# Patient Record
Sex: Female | Born: 1970 | Race: White | Hispanic: No | Marital: Married | Smoking: Former smoker
Health system: Southern US, Community
[De-identification: ages and names within clinical notes are randomized; demographics above are authoritative.]

## PROBLEM LIST (undated history)

## (undated) DIAGNOSIS — M549 Dorsalgia, unspecified: Secondary | ICD-10-CM

## (undated) DIAGNOSIS — M5134 Other intervertebral disc degeneration, thoracic region: Secondary | ICD-10-CM

## (undated) DIAGNOSIS — K589 Irritable bowel syndrome without diarrhea: Secondary | ICD-10-CM

## (undated) DIAGNOSIS — D126 Benign neoplasm of colon, unspecified: Secondary | ICD-10-CM

## (undated) DIAGNOSIS — R51 Headache: Secondary | ICD-10-CM

## (undated) DIAGNOSIS — R112 Nausea with vomiting, unspecified: Secondary | ICD-10-CM

## (undated) DIAGNOSIS — G629 Polyneuropathy, unspecified: Secondary | ICD-10-CM

## (undated) DIAGNOSIS — R159 Full incontinence of feces: Secondary | ICD-10-CM

## (undated) DIAGNOSIS — K219 Gastro-esophageal reflux disease without esophagitis: Secondary | ICD-10-CM

## (undated) DIAGNOSIS — IMO0002 Reserved for concepts with insufficient information to code with codable children: Secondary | ICD-10-CM

## (undated) DIAGNOSIS — G8929 Other chronic pain: Secondary | ICD-10-CM

## (undated) DIAGNOSIS — F419 Anxiety disorder, unspecified: Secondary | ICD-10-CM

## (undated) DIAGNOSIS — J302 Other seasonal allergic rhinitis: Secondary | ICD-10-CM

## (undated) DIAGNOSIS — K598 Other specified functional intestinal disorders: Secondary | ICD-10-CM

## (undated) DIAGNOSIS — K3184 Gastroparesis: Secondary | ICD-10-CM

## (undated) DIAGNOSIS — Z9889 Other specified postprocedural states: Secondary | ICD-10-CM

## (undated) HISTORY — DX: Other specified functional intestinal disorders: K59.8

## (undated) HISTORY — DX: Other seasonal allergic rhinitis: J30.2

## (undated) HISTORY — PX: OTHER SURGICAL HISTORY: SHX169

## (undated) HISTORY — PX: CHOLECYSTECTOMY: SHX55

## (undated) HISTORY — PX: KNEE SURGERY: SHX244

## (undated) HISTORY — PX: ABLATION: SHX5711

## (undated) HISTORY — PX: ESOPHAGEAL DILATION: SHX303

## (undated) HISTORY — DX: Benign neoplasm of colon, unspecified: D12.6

## (undated) HISTORY — PX: OVARIAN CYST REMOVAL: SHX89

## (undated) HISTORY — DX: Reserved for concepts with insufficient information to code with codable children: IMO0002

## (undated) HISTORY — PX: COLON SURGERY: SHX602

## (undated) HISTORY — PX: APPENDECTOMY: SHX54

## (undated) HISTORY — PX: BREAST LUMPECTOMY: SHX2

## (undated) HISTORY — PX: COLONOSCOPY: SHX174

## (undated) HISTORY — PX: TUBAL LIGATION: SHX77

---

## 2010-04-12 ENCOUNTER — Ambulatory Visit: Payer: Self-pay | Admitting: Gastroenterology

## 2010-04-12 DIAGNOSIS — K589 Irritable bowel syndrome without diarrhea: Secondary | ICD-10-CM | POA: Insufficient documentation

## 2010-04-13 ENCOUNTER — Encounter: Payer: Self-pay | Admitting: Gastroenterology

## 2010-05-02 ENCOUNTER — Ambulatory Visit: Payer: Self-pay | Admitting: Gastroenterology

## 2010-05-02 ENCOUNTER — Ambulatory Visit (HOSPITAL_COMMUNITY)
Admission: RE | Admit: 2010-05-02 | Discharge: 2010-05-02 | Payer: Self-pay | Source: Home / Self Care | Admitting: Gastroenterology

## 2010-05-24 ENCOUNTER — Ambulatory Visit (HOSPITAL_COMMUNITY)
Admission: RE | Admit: 2010-05-24 | Discharge: 2010-05-24 | Payer: Self-pay | Source: Home / Self Care | Admitting: Gastroenterology

## 2010-05-24 DIAGNOSIS — D126 Benign neoplasm of colon, unspecified: Secondary | ICD-10-CM

## 2010-05-24 HISTORY — DX: Benign neoplasm of colon, unspecified: D12.6

## 2010-05-28 ENCOUNTER — Telehealth (INDEPENDENT_AMBULATORY_CARE_PROVIDER_SITE_OTHER): Payer: Self-pay

## 2010-06-07 ENCOUNTER — Encounter: Payer: Self-pay | Admitting: Urgent Care

## 2010-06-14 ENCOUNTER — Telehealth (INDEPENDENT_AMBULATORY_CARE_PROVIDER_SITE_OTHER): Payer: Self-pay

## 2010-07-24 NOTE — Letter (Signed)
Summary: TCS ORDER  TCS ORDER   Imported By: Ave Filter 05/02/2010 10:08:44  _____________________________________________________________________  External Attachment:    Type:   Image     Comment:   External Document

## 2010-07-24 NOTE — Assessment & Plan Note (Signed)
Summary: SCREENING, IBS-C   Visit Type:  Initial Consult Primary Care Joyia Riehle:  [facility]  Chief Complaint:  constipation, abd pain, and needs tcs.  History of Present Illness: FamHx: brother and sister, colon CA and died from it. No bleeding, black tarry stools,  Constipation: BMs-unless she has stool softener [employer]-Equate, 1st BM: solid then watery. Constipation all her life. No problems swallowing, fever, "cold chills". Nause if can't go to BM> May have burning sensation,. Does get better after BM.  Preventive Screening-Counseling & Management  Alcohol-Tobacco     Smoking Status: current  Current Medications (verified): 1)  Stool Softener With Laxative .Marland Kitchen.. 4 Once Daily 2)  Otc Allergy .... Once Daily 3)  Robitussin .... Once Daily 4)  Otc Niquil .... At Bedtime  Allergies (verified): 1)  ! Asa 2)  ! Pcn 3)  ! 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  Chest congestion.  Vital Signs:  Patient profile:   40 year old female Height:      65 inches Weight:      172 pounds BMI:     28.73 Temp:     98.3 degrees F oral Pulse rate:   88 / minute BP sitting:   138 / 90  (left arm) Cuff size:   regular  Vitals Entered By: Hendricks Limes LPN (April 12, 2010 1:03 PM)  Physical Exam  General:  Well developed, well nourished, no acute distress. Head:  Normocephalic and atraumatic. Eyes:  PERRL, no icterus. Mouth:  No deformity or lesions. Neck:  Supple; no masses. Lungs:  Clear throughout to auscultation. Heart:  Regular rate and rhythm; no  murmurs. Abdomen:  Soft, nontender and nondistended. Normal bowel sounds. Extremities:  No edema or deformities noted. Neurologic:  Alert and  oriented x4;  grossly normal neurologically.  Impression & Recommendations:  Problem # 1:  SCREENING, COLON CANCER (ICD-V76.51) 2 first degree relatives w/ colon CA  ~age 70. TCS NOV 9.  Problem # 2:  IRRITABLE BOWEL SYNDROME (ICD-564.1) Assessment: Unchanged FOR CONSTIPATION: TAKE A PROBIOTIC EVERY DAY. DA-C sample given, #28. DRINK 6 TO 8 CUPS OF WATER DAILY FOLLOW A HIGH FIBER DIET. SEE HO. You may continue to use [employer] Equate stool softener.  CC: PCP  Patient Instructions: 1)  FOR CONSTIPATION: 2)  TAKE A PROBIOTIC EVERY DAY. 3)  DRINK 6 TO 8 CUPS OF WATER DAILY 4)  FOLLOW A HIGH FIBER DIET. SEE HO. 5)  You may continue to use [employer] Equate stool softener. 6)  Colonoscopy on NOV 9. 7)  Follow up in 4 mos. 8)  The medication list was reviewed and reconciled.  All changed / newly prescribed medications were explained.  A complete medication list was provided to the patient / caregiver.  Appended Document: SCREENING, IBS-C reminder in computer  Appended Document: Orders Update    Clinical Lists Changes  Orders: Added new Service order of Consultation Level IV [phone]) - Signed

## 2010-07-24 NOTE — Progress Notes (Signed)
Summary: ? about amitiza  Phone Note Call from Patient Call back at [facility] Phone [phone]   Caller: Patient Summary of Call: pt came by office- left pt assistance paperwork for amitiza and requested samples. gave pt #4 boxes of Amitiza which is what slf put in op note. pt stated she was taking amitiza in a blue box and her rx was written for . Informed pt I would have to clarify with slf before I could give her the since was in op note. Which should pt be taking?  Pt also stated she has not had a bm since doing her prep on Thursday and had some L side abd pain on Sat and Sun. She has been taking the [place]. Wants to know if there is anything else she needs to do? please advise Initial call taken by: Hendricks Limes LPN,  May 28, 2010 4:09 PM     Appended Document: ? about amitiza Pt may take Amitiza 24 micrograms two times a day. Mild abd pain is expected after TCS in pts who have IBS. Continue high fiber diet and drink 8 cups of water daily.  Appended Document: ? about amitiza tried to call pt- LMOM  Appended Document: ? about amitiza pt aware

## 2010-07-24 NOTE — Letter (Signed)
Summary: TCS ORDER  TCS ORDER   Imported By: Rexene Alberts 04/13/2010 10:02:14  _____________________________________________________________________  External Attachment:    Type:   Image     Comment:   External Document  Appended Document: TCS ORDER Pt needs TCS w/i next momth w/ propofol. Unable to to be adequately sedated with Demerol and Versed. NEEDS OVERTUBE-HALFLYTELY. BEGIN CLEAR LIQUIDS WITH LUNCH 2 DAYS PRIOR TO PROCEDURE.  Appended Document: TCS ORDER Pt rescheduled to 05/24/10 in the OR..Instructions placed in the mail.

## 2010-07-26 NOTE — Progress Notes (Signed)
Summary: ?'s about Amitiza Assistance/ samples  Phone Note Call from Patient   Caller: Patient Summary of Call: Pt called and said she has not heard anything from the Program/Assistance that was supposed to be sending her  Amitiza. She is out. I told her i would check with Raynelle Fanning and see when she should be getting the med in mail. Leaving  samples of Amitiza 24 micrograms  # 16 at front for pt. She takes two times a day. Initial call taken by: Cloria Spring LPN,  June 14, 2010 10:15 AM     Appended Document: ?'s about Amitiza Assistance/ samples spoke with pt assistance- pts order shipped 06/12/10 and could take up to 5 business days to get here  Appended Document: ?'s about Amitiza Assistance/ samples pt aware

## 2010-09-04 LAB — HCG, QUANTITATIVE, PREGNANCY: hCG, Beta Chain, Quant, S: <2 m[IU]/mL (ref <5)

## 2010-09-04 LAB — BASIC METABOLIC PANEL
Calcium: 9.6 mg/dL (ref 8.4–10.5)
GFR calc Af Amer: >60 mL/min (ref >60)
GFR calc non Af Amer: >60 mL/min (ref >60)
Glucose, Bld: 94 mg/dL (ref 70–99)
Potassium: 4.2 mEq/L (ref 3.5–5.1)
Sodium: 137 mEq/L (ref 135–145)

## 2010-09-04 LAB — HEMOGLOBIN AND HEMATOCRIT, BLOOD: HCT: 40 % (ref 36.0–46.0)

## 2010-09-20 NOTE — Medication Information (Signed)
Summary: pt assistance paperwork  pt assistance paperwork   Imported By: Hendricks Limes LPN 82/95/6213 08:65:78  _____________________________________________________________________  External Attachment:    Type:   Image     Comment:   External Document

## 2010-12-24 ENCOUNTER — Other Ambulatory Visit: Payer: Self-pay

## 2010-12-24 MED ORDER — LUBIPROSTONE 24 MCG PO CAPS: 24.0000 ug | ORAL_CAPSULE | Freq: Two times a day (BID) | ORAL | Status: AC

## 2010-12-24 NOTE — Telephone Encounter (Signed)
RX done. Patient needs OV for continued refills.

## 2010-12-24 NOTE — Telephone Encounter (Signed)
rx faxed to Patient assistance. Please schedule appt.

## 2010-12-24 NOTE — Telephone Encounter (Addendum)
Pt needs written rx for patient assistance program for Amitiza . Fax number is [phone]

## 2010-12-25 ENCOUNTER — Encounter: Payer: Self-pay | Admitting: Internal Medicine

## 2010-12-25 NOTE — Telephone Encounter (Signed)
Pt is aware of OV for 7/26 @ 2pm with [facility]

## 2011-01-17 ENCOUNTER — Ambulatory Visit (INDEPENDENT_AMBULATORY_CARE_PROVIDER_SITE_OTHER): Payer: Medicaid Other | Admitting: Gastroenterology

## 2011-01-17 ENCOUNTER — Encounter: Payer: Self-pay | Admitting: Gastroenterology

## 2011-01-17 DIAGNOSIS — K589 Irritable bowel syndrome without diarrhea: Secondary | ICD-10-CM

## 2011-01-17 DIAGNOSIS — D126 Benign neoplasm of colon, unspecified: Secondary | ICD-10-CM

## 2011-01-17 NOTE — Assessment & Plan Note (Addendum)
Removed DEC 2011.  TCS DEC 2016 WITH OVERTUBE AND PROPOFOL.

## 2011-01-17 NOTE — Progress Notes (Signed)
  Subjective:    Patient ID: Alicia Tyler, female    DOB: [birth year], 40 y.o.   MRN: [id number]   HPI Amitiza helps. Went from "never" and may have a BM q3days. Hurts and has nausea if she doesn't have a BM. hans't tried MLX with teh Amitiza. Uses [employer] stool softener/stimulant and it helps. Also Fiber gummies helps as well. Questions about next TCS. Last BM yesterday. INTENTIONALLY LOST 23 LBS.  Past Medical History  Diagnosis Date  . Seasonal allergies   . BMI (body mass index) 20.0-29.01 Apr 2010 172 LBS   Past Surgical History  Procedure Date  . Tubal ligation   . Ovarian cyst removal   . Breast lumpectomy     from the left breast  . Colonoscopy NOV 2011 SCREENING     HYPERPLASTIC RECTAL POLYP, SML IH, incomplete due to discomfort  . Colonoscopy DEC 2011 w/ PROPOFOL    SERRATED ADENOMA, HYPERPLASTIC POLY[S[   Allergies  Allergen Reactions  . Aspirin   . Codeine   . Penicillins    Current Outpatient Prescriptions  Medication Sig Dispense Refill  . Biotin 5000 MCG CAPS Take 5,000 mcg by mouth 2 (two) times daily.        . Cholecalciferol (VITAMIN D) 2000 UNITS tablet Take 2,000 Units by mouth daily.        Marland Kitchen gabapentin (NEURONTIN) 100 MG tablet Take 100 mg by mouth 3 (three) times daily.        Marland Kitchen loratadine (CLARITIN) 10 MG tablet Take 10 mg by mouth daily.        Marland Kitchen lubiprostone (AMITIZA) 24 MCG capsule Take 1 capsule (24 mcg total) by mouth 2 (two) times daily with a meal.  60 capsule  5  . rizatriptan (MAXALT-MLT) 10 MG disintegrating tablet Take 10 mg by mouth as needed. May repeat in 2 hours if needed       . topiramate (TOPAMAX) 50 MG tablet Take 50 mg by mouth 2 (two) times daily.         Family History  Problem Relation Age of Onset  . Breast cancer Maternal Aunt   . Colon cancer Neg Hx   . Colon polyps Neg Hx       Review of Systems     Objective:   Physical Exam  Vitals reviewed. Constitutional: She is oriented to person, place, and time. She appears  well-developed and well-nourished. No distress.  Cardiovascular: Normal rate, regular rhythm and normal heart sounds.   Pulmonary/Chest: Effort normal and breath sounds normal.  Abdominal: Soft. Bowel sounds are normal. She exhibits no distension. There is tenderness (MILD RLQ TTP when LLQ palpated).  Neurological: She is alert and oriented to person, place, and time.          Assessment & Plan:

## 2011-01-17 NOTE — Progress Notes (Signed)
Cc to PCP 

## 2011-01-17 NOTE — Assessment & Plan Note (Signed)
Sx improved.  Continue Amitiza. Use [employer] laxative 2-4 times a week. Use Gummies when she can afford them. OPV in 6 mos.

## 2011-01-17 NOTE — Progress Notes (Signed)
Reminder in epic to follow up in 6 months °

## 2011-05-07 ENCOUNTER — Other Ambulatory Visit: Payer: Self-pay | Admitting: Neurosurgery

## 2011-05-08 ENCOUNTER — Encounter (HOSPITAL_COMMUNITY): Payer: Self-pay | Admitting: Pharmacy Technician

## 2011-05-09 ENCOUNTER — Encounter (HOSPITAL_COMMUNITY)
Admission: RE | Admit: 2011-05-09 | Discharge: 2011-05-09 | Disposition: A | Discharge status: Home / Self Care | Payer: Medicaid Other | Source: Ambulatory Visit | Attending: Neurosurgery | Admitting: Neurosurgery

## 2011-05-09 ENCOUNTER — Encounter (HOSPITAL_COMMUNITY): Payer: Self-pay

## 2011-05-09 HISTORY — DX: Headache: R51

## 2011-05-09 HISTORY — DX: Anxiety disorder, unspecified: F41.9

## 2011-05-09 LAB — BASIC METABOLIC PANEL
CO2: 23 mEq/L (ref 19–32)
Calcium: 10.2 mg/dL (ref 8.4–10.5)
Creatinine, Ser: 0.91 mg/dL (ref 0.50–1.10)
GFR calc non Af Amer: 78 mL/min — ABNORMAL LOW (ref >90)
Glucose, Bld: 97 mg/dL (ref 70–99)

## 2011-05-09 LAB — CBC
MCH: 30.9 pg (ref 26.0–34.0)
MCHC: 34.3 g/dL (ref 30.0–36.0)
MCV: 90.1 fL (ref 78.0–100.0)
Platelets: 289 10*3/uL (ref 150–400)
RBC: 4.53 MIL/uL (ref 3.87–5.11)

## 2011-05-09 MED ORDER — VANCOMYCIN HCL IN DEXTROSE 1-5 GM/200ML-% IV SOLN: 1000.0000 mg | INTRAVENOUS | Status: DC

## 2011-05-09 NOTE — Pre-Procedure Instructions (Signed)
20 Alicia Tyler  05/09/2011   Your procedure is scheduled on:  05/14/11  Report to [facility] at 730 AM.  Call this number if you have problems the morning of surgery: [phone]   Remember:   Do not eat food:After Midnight.  Do not drink clear liquids: 4 Hours before arrival.  Take these medicines the morning of surgery with A SIP OF WATER: xanax,norco,topamax,neurontin   Do not wear jewelry, make-up or nail polish.  Do not wear lotions, powders, or perfumes. You may wear deodorant.  Do not shave 48 hours prior to surgery.  Do not bring valuables to the hospital.  Contacts, dentures or bridgework may not be worn into surgery.  Leave suitcase in the car. After surgery it may be brought to your room.  For patients admitted to the hospital, checkout time is 11:00 AM the day of discharge.   Patients discharged the day of surgery will not be allowed to drive home.  Name and phone number of your driver: family  Special Instructions: CHG Shower Use Special Wash: 1/2 bottle night before surgery and 1/2 bottle morning of surgery.   Please read over the following fact sheets that you were given: MRSA Information and Surgical Site Infection Prevention

## 2011-05-14 ENCOUNTER — Encounter (HOSPITAL_COMMUNITY): Payer: Self-pay | Admitting: Anesthesiology

## 2011-05-14 ENCOUNTER — Inpatient Hospital Stay (HOSPITAL_COMMUNITY)
Admission: RE | Admit: 2011-05-14 | Discharge: 2011-05-18 | DRG: 460 | Disposition: A | Discharge status: Home / Self Care | Payer: Medicaid Other | Source: Ambulatory Visit | Attending: Neurosurgery | Admitting: Neurosurgery

## 2011-05-14 ENCOUNTER — Inpatient Hospital Stay (HOSPITAL_COMMUNITY): Payer: Medicaid Other

## 2011-05-14 ENCOUNTER — Inpatient Hospital Stay (HOSPITAL_COMMUNITY): Payer: Medicaid Other | Admitting: Anesthesiology

## 2011-05-14 ENCOUNTER — Encounter (HOSPITAL_COMMUNITY): Payer: Self-pay | Admitting: *Deleted

## 2011-05-14 ENCOUNTER — Encounter (HOSPITAL_COMMUNITY)
Admission: RE | Disposition: A | Discharge status: Home / Self Care | Payer: Self-pay | Source: Ambulatory Visit | Attending: Neurosurgery

## 2011-05-14 DIAGNOSIS — K589 Irritable bowel syndrome without diarrhea: Secondary | ICD-10-CM

## 2011-05-14 DIAGNOSIS — M5137 Other intervertebral disc degeneration, lumbosacral region: Principal | ICD-10-CM | POA: Diagnosis present

## 2011-05-14 DIAGNOSIS — M4310 Spondylolisthesis, site unspecified: Secondary | ICD-10-CM | POA: Diagnosis present

## 2011-05-14 DIAGNOSIS — M51379 Other intervertebral disc degeneration, lumbosacral region without mention of lumbar back pain or lower extremity pain: Principal | ICD-10-CM | POA: Diagnosis present

## 2011-05-14 DIAGNOSIS — Z01812 Encounter for preprocedural laboratory examination: Secondary | ICD-10-CM

## 2011-05-14 HISTORY — PX: BACK SURGERY: SHX140

## 2011-05-14 HISTORY — DX: Nausea with vomiting, unspecified: R11.2

## 2011-05-14 HISTORY — DX: Other specified postprocedural states: Z98.890

## 2011-05-14 LAB — TYPE AND SCREEN
ABO/RH(D): A POS
Antibody Screen: NEGATIVE

## 2011-05-14 SURGERY — POSTERIOR LUMBAR FUSION 1 LEVEL
Anesthesia: General | Site: Back

## 2011-05-14 MED ORDER — BUPIVACAINE-EPINEPHRINE PF 0.5-1:200000 % IJ SOLN: INTRAMUSCULAR | Status: DC | PRN

## 2011-05-14 MED ORDER — PROMETHAZINE HCL 25 MG/ML IJ SOLN: 6.2500 mg | INTRAMUSCULAR | Status: DC | PRN

## 2011-05-14 MED ORDER — ROCURONIUM BROMIDE 100 MG/10ML IV SOLN
INTRAVENOUS | Status: DC | PRN
  Administered 2011-05-14: 50 mg via INTRAVENOUS
  Administered 2011-05-14: 10 mg via INTRAVENOUS

## 2011-05-14 MED ORDER — ACETAMINOPHEN 650 MG RE SUPP: 650.0000 mg | RECTAL | Status: DC | PRN

## 2011-05-14 MED ORDER — DOCUSATE SODIUM 100 MG PO CAPS
100.0000 mg | ORAL_CAPSULE | Freq: Two times a day (BID) | ORAL | Status: DC
Start: 2011-05-14 — End: 2011-05-18
  Administered 2011-05-14 – 2011-05-18 (×8): 100 mg via ORAL
  Filled 2011-05-14 (×7): qty 1

## 2011-05-14 MED ORDER — VANCOMYCIN HCL IN DEXTROSE 1-5 GM/200ML-% IV SOLN
1000.0000 mg | Freq: Once | INTRAVENOUS | Status: AC
  Administered 2011-05-14: 1000 mg via INTRAVENOUS
  Filled 2011-05-14: qty 200

## 2011-05-14 MED ORDER — LACTATED RINGERS IV SOLN
1000.0000 mL | INTRAVENOUS | Status: DC
  Administered 2011-05-14 (×2): via INTRAVENOUS

## 2011-05-14 MED ORDER — HYDROMORPHONE HCL PF 1 MG/ML IJ SOLN
0.2500 mg | INTRAMUSCULAR | Status: DC | PRN
  Administered 2011-05-14: 0.25 mg via INTRAVENOUS

## 2011-05-14 MED ORDER — OXYCODONE-ACETAMINOPHEN 5-325 MG PO TABS
1.0000 | ORAL_TABLET | ORAL | Status: DC | PRN
  Administered 2011-05-17 – 2011-05-18 (×6): 2 via ORAL
  Filled 2011-05-14 (×6): qty 2

## 2011-05-14 MED ORDER — MORPHINE SULFATE 2 MG/ML IJ SOLN: 2.0000 mg | INTRAMUSCULAR | Status: DC | PRN

## 2011-05-14 MED ORDER — MIDAZOLAM HCL 2 MG/2ML IJ SOLN: 0.5000 mg | Freq: Once | INTRAMUSCULAR | Status: DC | PRN

## 2011-05-14 MED ORDER — MEPERIDINE HCL 25 MG/ML IJ SOLN: 6.2500 mg | INTRAMUSCULAR | Status: DC | PRN

## 2011-05-14 MED ORDER — ONDANSETRON HCL 4 MG/2ML IJ SOLN
INTRAMUSCULAR | Status: DC | PRN
  Administered 2011-05-14: 4 mg via INTRAVENOUS

## 2011-05-14 MED ORDER — SODIUM CHLORIDE 0.9 % IR SOLN
Status: DC | PRN
  Administered 2011-05-14: 1000 mL

## 2011-05-14 MED ORDER — DIPHENHYDRAMINE HCL 12.5 MG/5ML PO ELIX: 12.5000 mg | ORAL_SOLUTION | Freq: Four times a day (QID) | ORAL | Status: DC | PRN

## 2011-05-14 MED ORDER — SODIUM CHLORIDE 0.9 % IJ SOLN: 3.0000 mL | Freq: Two times a day (BID) | INTRAMUSCULAR | Status: DC

## 2011-05-14 MED ORDER — DEXAMETHASONE SODIUM PHOSPHATE 4 MG/ML IJ SOLN
INTRAMUSCULAR | Status: DC | PRN
  Administered 2011-05-14: 4 mg via INTRAVENOUS

## 2011-05-14 MED ORDER — DIPHENHYDRAMINE HCL 50 MG/ML IJ SOLN: 12.5000 mg | Freq: Four times a day (QID) | INTRAMUSCULAR | Status: DC | PRN

## 2011-05-14 MED ORDER — MORPHINE SULFATE 2 MG/ML IJ SOLN: 0.0500 mg/kg | INTRAMUSCULAR | Status: DC | PRN

## 2011-05-14 MED ORDER — HYDROMORPHONE HCL PF 1 MG/ML IJ SOLN: 0.2500 mg | INTRAMUSCULAR | Status: DC | PRN

## 2011-05-14 MED ORDER — THROMBIN 20000 UNITS EX KIT
PACK | CUTANEOUS | Status: DC | PRN
  Administered 2011-05-14: 10:00:00 via TOPICAL

## 2011-05-14 MED ORDER — GLYCOPYRROLATE 0.2 MG/ML IJ SOLN
INTRAMUSCULAR | Status: DC | PRN
  Administered 2011-05-14: .6 mg via INTRAVENOUS

## 2011-05-14 MED ORDER — NALOXONE HCL 0.4 MG/ML IJ SOLN: 0.4000 mg | INTRAMUSCULAR | Status: DC | PRN

## 2011-05-14 MED ORDER — SODIUM CHLORIDE 0.9 % IV SOLN: 250.0000 mL | INTRAVENOUS | Status: DC

## 2011-05-14 MED ORDER — MORPHINE SULFATE (PF) 1 MG/ML IV SOLN
INTRAVENOUS | Status: DC
  Administered 2011-05-14: 21.74 mg via INTRAVENOUS
  Administered 2011-05-14: 17:00:00 via INTRAVENOUS
  Administered 2011-05-14: 25 mg via INTRAVENOUS
  Administered 2011-05-14: 14:00:00 via INTRAVENOUS
  Administered 2011-05-15: 10.34 mg via INTRAVENOUS
  Administered 2011-05-15: 15 mg via INTRAVENOUS
  Administered 2011-05-15: 18.41 mg via INTRAVENOUS
  Administered 2011-05-15 (×2): 25 mg via INTRAVENOUS
  Administered 2011-05-15: 18 mg via INTRAVENOUS
  Administered 2011-05-16: 25 mg via INTRAVENOUS
  Administered 2011-05-16 (×2): 7.5 mg via INTRAVENOUS
  Administered 2011-05-16 (×2): 1.5 mg via INTRAVENOUS
  Administered 2011-05-16: 10.5 mg via INTRAVENOUS
  Administered 2011-05-17: 1.5 mg via INTRAVENOUS
  Filled 2011-05-14 (×9): qty 25

## 2011-05-14 MED ORDER — MENTHOL 3 MG MT LOZG: 1.0000 | LOZENGE | OROMUCOSAL | Status: DC | PRN

## 2011-05-14 MED ORDER — ONDANSETRON HCL 4 MG/2ML IJ SOLN
4.0000 mg | Freq: Four times a day (QID) | INTRAMUSCULAR | Status: DC | PRN
  Administered 2011-05-15: 4 mg via INTRAVENOUS
  Filled 2011-05-14 (×2): qty 2

## 2011-05-14 MED ORDER — FENTANYL CITRATE 0.05 MG/ML IJ SOLN
INTRAMUSCULAR | Status: DC | PRN
  Administered 2011-05-14: 50 ug via INTRAVENOUS
  Administered 2011-05-14: 150 ug via INTRAVENOUS
  Administered 2011-05-14: 50 ug via INTRAVENOUS
  Administered 2011-05-14: 100 ug via INTRAVENOUS
  Administered 2011-05-14 (×2): 50 ug via INTRAVENOUS

## 2011-05-14 MED ORDER — NEOSTIGMINE METHYLSULFATE 1 MG/ML IJ SOLN
INTRAMUSCULAR | Status: DC | PRN
  Administered 2011-05-14: 4 mg via INTRAVENOUS

## 2011-05-14 MED ORDER — PROPOFOL 10 MG/ML IV EMUL
INTRAVENOUS | Status: DC | PRN
  Administered 2011-05-14: 150 mg via INTRAVENOUS
  Administered 2011-05-14: 100 mg via INTRAVENOUS

## 2011-05-14 MED ORDER — ONDANSETRON HCL 4 MG/2ML IJ SOLN: 4.0000 mg | Freq: Four times a day (QID) | INTRAMUSCULAR | Status: DC | PRN

## 2011-05-14 MED ORDER — ACETAMINOPHEN 325 MG PO TABS
650.0000 mg | ORAL_TABLET | ORAL | Status: DC | PRN
  Administered 2011-05-17 – 2011-05-18 (×2): 650 mg via ORAL
  Filled 2011-05-14 (×2): qty 2

## 2011-05-14 MED ORDER — SODIUM CHLORIDE 0.9 % IJ SOLN: 9.0000 mL | INTRAMUSCULAR | Status: DC | PRN

## 2011-05-14 MED ORDER — MIDAZOLAM HCL 5 MG/5ML IJ SOLN
INTRAMUSCULAR | Status: DC | PRN
  Administered 2011-05-14: 2 mg via INTRAVENOUS

## 2011-05-14 MED ORDER — BUPIVACAINE LIPOSOME 1.3 % IJ SUSP
20.0000 mL | Freq: Once | INTRAMUSCULAR | Status: AC
  Administered 2011-05-14: 20 mL
  Filled 2011-05-14: qty 20

## 2011-05-14 MED ORDER — ZOLPIDEM TARTRATE 10 MG PO TABS: 10.0000 mg | ORAL_TABLET | Freq: Every evening | ORAL | Status: DC | PRN

## 2011-05-14 MED ORDER — POTASSIUM CHLORIDE IN NACL 20-0.9 MEQ/L-% IV SOLN
INTRAVENOUS | Status: DC
  Administered 2011-05-14 – 2011-05-17 (×2): via INTRAVENOUS
  Filled 2011-05-14 (×12): qty 1000

## 2011-05-14 MED ORDER — VANCOMYCIN HCL IN DEXTROSE 1-5 GM/200ML-% IV SOLN
1000.0000 mg | Freq: Two times a day (BID) | INTRAVENOUS | Status: DC
  Filled 2011-05-14: qty 200

## 2011-05-14 SURGICAL SUPPLY — 67 items
BENZOIN TINCTURE PRP APPL 2/3 (GAUZE/BANDAGES/DRESSINGS) ×2 IMPLANT
BLADE SURG ROTATE 9660 (MISCELLANEOUS) IMPLANT
BUR ACORN 6.0 (BURR) ×2 IMPLANT
BUR MATCHSTICK NEURO 3.0 LAGG (BURR) ×2 IMPLANT
CANISTER SUCTION 2500CC (MISCELLANEOUS) ×2 IMPLANT
CLOTH BEACON ORANGE TIMEOUT ST (SAFETY) ×2 IMPLANT
CONT SPEC 4OZ CLIKSEAL STRL BL (MISCELLANEOUS) ×4 IMPLANT
COVER BACK TABLE 24X17X13 BIG (DRAPES) IMPLANT
COVER TABLE BACK 60X90 (DRAPES) ×2 IMPLANT
DRAPE C-ARM 42X72 X-RAY (DRAPES) ×4 IMPLANT
DRAPE LAPAROTOMY 100X72X124 (DRAPES) ×2 IMPLANT
DRAPE POUCH INSTRU U-SHP 10X18 (DRAPES) ×2 IMPLANT
DRSG PAD ABDOMINAL 8X10 ST (GAUZE/BANDAGES/DRESSINGS) ×2 IMPLANT
DURAPREP 26ML APPLICATOR (WOUND CARE) ×2 IMPLANT
ELECT BLADE 4.0 EZ CLEAN MEGAD (MISCELLANEOUS) ×2
ELECT REM PT RETURN 9FT ADLT (ELECTROSURGICAL) ×2
ELECTRODE BLDE 4.0 EZ CLN MEGD (MISCELLANEOUS) ×1 IMPLANT
ELECTRODE REM PT RTRN 9FT ADLT (ELECTROSURGICAL) ×1 IMPLANT
EVACUATOR 1/8 PVC DRAIN (DRAIN) IMPLANT
GAUZE SPONGE 4X4 16PLY XRAY LF (GAUZE/BANDAGES/DRESSINGS) ×2 IMPLANT
GLOVE BIOGEL M 8.0 STRL (GLOVE) ×4 IMPLANT
GLOVE BIOGEL PI IND STRL 7.0 (GLOVE) ×1 IMPLANT
GLOVE BIOGEL PI IND STRL 8.5 (GLOVE) ×1 IMPLANT
GLOVE BIOGEL PI INDICATOR 7.0 (GLOVE) ×1
GLOVE BIOGEL PI INDICATOR 8.5 (GLOVE) ×1
GLOVE ECLIPSE 7.5 STRL STRAW (GLOVE) ×6 IMPLANT
GLOVE ECLIPSE 8.5 STRL (GLOVE) ×2 IMPLANT
GLOVE EXAM NITRILE LRG STRL (GLOVE) IMPLANT
GLOVE EXAM NITRILE MD LF STRL (GLOVE) ×2 IMPLANT
GLOVE EXAM NITRILE XL STR (GLOVE) IMPLANT
GLOVE EXAM NITRILE XS STR PU (GLOVE) IMPLANT
GLOVE INDICATOR 8.0 STRL GRN (GLOVE) ×2 IMPLANT
GLOVE SURG SS PI 6.5 STRL IVOR (GLOVE) ×4 IMPLANT
GOWN BRE IMP SLV AUR LG STRL (GOWN DISPOSABLE) ×2 IMPLANT
GOWN BRE IMP SLV AUR XL STRL (GOWN DISPOSABLE) ×4 IMPLANT
GOWN STRL REIN 2XL LVL4 (GOWN DISPOSABLE) ×4 IMPLANT
KIT BASIN OR (CUSTOM PROCEDURE TRAY) ×2 IMPLANT
KIT ROOM TURNOVER OR (KITS) ×2 IMPLANT
NEEDLE HYPO 18GX1.5 BLUNT FILL (NEEDLE) IMPLANT
NEEDLE HYPO 21X1.5 SAFETY (NEEDLE) ×2 IMPLANT
NEEDLE HYPO 25X1 1.5 SAFETY (NEEDLE) IMPLANT
NS IRRIG 1000ML POUR BTL (IV SOLUTION) ×2 IMPLANT
PACK LAMINECTOMY NEURO (CUSTOM PROCEDURE TRAY) ×2 IMPLANT
PAD ARMBOARD 7.5X6 YLW CONV (MISCELLANEOUS) ×6 IMPLANT
PATTIES SURGICAL .5 X1 (DISPOSABLE) ×2 IMPLANT
PATTIES SURGICAL .5 X3 (DISPOSABLE) IMPLANT
ROD REVERE 6.35 40MM (Rod) ×4 IMPLANT
SCREW REVERE 5.5X45 (Screw) ×8 IMPLANT
SPACER SUSTAIN O SML 8X22 12MM (Spacer) ×4 IMPLANT
SPONGE GAUZE 4X4 12PLY (GAUZE/BANDAGES/DRESSINGS) ×2 IMPLANT
SPONGE LAP 4X18 X RAY DECT (DISPOSABLE) IMPLANT
SPONGE NEURO XRAY DETECT 1X3 (DISPOSABLE) IMPLANT
SPONGE SURGIFOAM ABS GEL 100 (HEMOSTASIS) ×2 IMPLANT
STRIP CLOSURE SKIN 1/2X4 (GAUZE/BANDAGES/DRESSINGS) ×2 IMPLANT
STRIP VITOSS 25X100X4MM (Neuro Prosthesis/Implant) ×2 IMPLANT
SUT VIC AB 1 CT1 18XBRD ANBCTR (SUTURE) ×1 IMPLANT
SUT VIC AB 1 CT1 8-18 (SUTURE) ×1
SUT VIC AB 2-0 CP2 18 (SUTURE) ×2 IMPLANT
SUT VIC AB 3-0 SH 8-18 (SUTURE) ×2 IMPLANT
SYR 20CC LL (SYRINGE) ×2 IMPLANT
SYR 20ML ECCENTRIC (SYRINGE) ×2 IMPLANT
SYR 5ML LL (SYRINGE) IMPLANT
TAPE CLOTH SURG 4X10 WHT LF (GAUZE/BANDAGES/DRESSINGS) ×2 IMPLANT
TOWEL OR 17X24 6PK STRL BLUE (TOWEL DISPOSABLE) ×2 IMPLANT
TOWEL OR 17X26 10 PK STRL BLUE (TOWEL DISPOSABLE) ×2 IMPLANT
TRAY FOLEY CATH 14FRSI W/METER (CATHETERS) ×2 IMPLANT
WATER STERILE IRR 1000ML POUR (IV SOLUTION) ×2 IMPLANT

## 2011-05-14 NOTE — Plan of Care (Signed)
Problem: Phase I Progression Outcomes Goal: Pain controlled with appropriate interventions Outcome: Progressing Pt. Continues to rate pain 7 out of 10 with PCA usage. She, however, states that 6 is an pain level in which she is comfortable. Goal: Initial discharge plan identified Outcome: Completed/Met Date Met:  05/14/11 Patient to return back to home with husband Goal: PT/OT consults requested Outcome: Completed/Met Date Met:  05/14/11 PT/OT consults
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
## 2011-05-14 NOTE — Anesthesia Preprocedure Evaluation (Addendum)
Anesthesia Evaluation  Patient identified by MRN, date of birth, ID band Patient awake    Reviewed: Allergy & Precautions, H&P , NPO status , Patient's Chart, lab work & pertinent test results  Airway Mallampati: II      Dental  (+) Dental Advisory Given   Pulmonary          Cardiovascular     Neuro/Psych  Headaches,    GI/Hepatic   Endo/Other    Renal/GU      Musculoskeletal   Abdominal   Peds  Hematology   Anesthesia Other Findings   Reproductive/Obstetrics                           Anesthesia Physical Anesthesia Plan  ASA: II  Anesthesia Plan: General   Post-op Pain Management:    Induction: Intravenous  Airway Management Planned: Oral ETT  Additional Equipment:   Intra-op Plan:   Post-operative Plan: Extubation in OR  Informed Consent: I have reviewed the patients History and Physical, chart, labs and discussed the procedure including the risks, benefits and alternatives for the proposed anesthesia with the patient or authorized representative who has indicated his/her understanding and acceptance.   Dental advisory given  Plan Discussed with: CRNA and Anesthesiologist  Anesthesia Plan Comments:        Anesthesia Quick Evaluation

## 2011-05-14 NOTE — Anesthesia Procedure Notes (Addendum)
Procedure Name: Intubation Date/Time: 05/14/2011 9:55 AM Performed by: Elizbeth Squires Pre-anesthesia Checklist: Patient identified, Emergency Drugs available, Suction available, Patient being monitored and Timeout performed Patient Re-evaluated:Patient Re-evaluated prior to inductionOxygen Delivery Method: Circle System Utilized Preoxygenation: Pre-oxygenation with 100% oxygen Intubation Type: IV induction Ventilation: Mask ventilation without difficulty Laryngoscope Size: Mac and 3 Grade View: Grade I Tube type: Oral Tube size: 7.0 mm Number of attempts: 1 Airway Equipment and Method: stylet Placement Confirmation: ETT inserted through vocal cords under direct vision,  positive ETCO2 and breath sounds checked- equal and bilateral Secured at: 21 cm Tube secured with: Tape Dental Injury: Teeth and Oropharynx as per pre-operative assessment
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
## 2011-05-14 NOTE — Transfer of Care (Signed)
Immediate Anesthesia Transfer of Care Note  Patient: Biomedical engineer  Procedure(s) Performed:  POSTERIOR LUMBAR FUSION 1 LEVEL - Lumbar five sacral one Diskectomy, cages, pedicle screws, posterolateral arthrodesis, cellsaver  Patient Location: PACU  Anesthesia Type: General  Level of Consciousness: awake  Airway & Oxygen Therapy: Patient Spontanous Breathing and Patient connected to nasal cannula oxygen  Post-op Assessment: Report given to PACU RN, Post -op Vital signs reviewed and stable and Patient moving all extremities  Post vital signs: Reviewed and stable  Complications: No apparent anesthesia complications

## 2011-05-14 NOTE — Op Note (Signed)
Brief history:lower back pain with radiation to lower extremities.xrays showed l5s1 grade 1 spondylolisthesis  Preoperative diagnosis:l5s1  spondylolisthesisDegenerative disc disease, spinal stenosis; lumbago; lumbar radiculopathy  Postoperative diagnosis:l5s1 spondylolisthesis Degenerative disc disease, spinal stenosis; lumbago; lumbar radiculopathy  Procedure:l5 Laminectomies/foraminotomies to decompress the bilaterall5s1nerve roots(the work required to do this was in addition to the work required to do the posterior lumbar interbody fusion because of the patient's spinal stenosis, facet arthropathy,. requiring a wide decompression of the nerve roots.);l5s1 posterior lumbar interbody fusion with local morselized autograft bone and vitosst extender; insertion of interbody prosthesis atl5s1l5s1(globus peek interbody prosthesis); posterior instrumentation from l5tos1with globus titanium pedicle screws and rods; posterior lateral arthrodesis atl5s1with local morselized autograft bone and Vitoss bone graft extender.c-arm and cell saver.  Surgeon: Dr. Hilda Tyler  Asst.:H. Danielle Dess  Anesthesia: Gen. endotracheal  Estimated blood loss:200 cc  Drains: None  Locations: None  Description of procedure: The patient was brought to the operating room by the anesthesia team. General endotracheal anesthesia was induced. The patient was turned to the prone position on the Wilson frame. The patient's lumbosacral region was then prepared with duraprep. Sterile drapes were applied.   With the scalpel to make a linear midline incision over thel5s1interspace. I then used electrocautery to perform a bilateral subperiosteal dissection exposing the spinous process and lamina ofl5. We then obtained intraoperative radiograph to confirm our location. We then inserted the retractor to provide exposure.  I began the decompression by using the high speed drill to perform laminectomies atl5. We then used the Kerrison  punches to widen the laminotomy and removed the ligamentum flavum atl5s1. We used the Kerrison punches to remove the medial facets atl5. We performed wide foraminotomies about thell5 and s1nerve roots completing the decompression.  We now turned our attention to the posterior lumbar interbody fusion. I used a scalpel to incise the intervertebral disc atl5s1. I then performed a partial intervertebral discectomy atl5s1using the pituitary forceps. We prepared the vertebral endplates atl5s80for the fusion by removing the soft tissues with the curettes. We then used the trial spacers to pick the appropriate sized interbody prosthesis. We prefilled the prosthesis with a combination of local morselized autograft bone that we obtained during the decompression as well as vitoss bone graft extender. We inserted the prefilled prosthesis into the interspace atl5s1. There was a good snug fit of the prosthesis in the interspace. We then filled and the remainder of the intervertebral disc space with local morselized autograft bone and Actifuse. This completed the posterior lumbar interbody arthrodesis. The cages size were 12mm x 22 mm.  We now turned attention to the instrumentation. Under fluoroscopic guidance we cannulated the bilateral l5 s1pedicles with the bone probe. We then removed the bone probe. He then tapped the pedicle with                                            4.5 millimeter tap. We then removed the tap. We probed inside the tapped pedicle with a ball probe to rule out cortical breaches. We then inserted a5.5 by pedicle screw into the l5s1pedicles bilaterally under fluoroscopic guidance. We then palpated along the medial aspect of the pedicles to rule out cortical breaches. There were none. The nerve roots were not injured. We then connected the unilateral pedicle screws with a lordotic rod. We compressed the construct and secured the rod in place with the  caps. We then tightened the caps  appropriately. This completed the instrumentation froml5s1.  We now turned our attention to the posterior lateral arthrodesis atl5s1. We used the high-speed drill to decorticate the remainder of the facets, pars, transverse process atl5s1. We then applied a combination of local morselized autograft bone and Vitoss bone graft extender over these decorticated posterior lateral structures. This completed the posterior lateral arthrodesis.  We then obtained hemostasis using bipolar electrocautery. We irrigated the wound out with saline solution. We inspected the thecal sac and nerve roots and noted they were well decompressed. We then removed the retractor. We reapproximated patient's thoracolumbar fascia with interrupted #1 Vicryl suture. We reapproximated patient's subcutaneous tissue with interrupted 2-0 Vicryl suture. The reapproximated patient's skin with Steri-Strips and benzoin. The wound was then coated with bacitracin ointment. A sterile dressing was applied. The drapes were removed. The patient was subsequently returned to the supine position where they were extubated by the anesthesia team. He was then transported to the post anesthesia care unit in stable condition. All sponge instrument and needle counts were correct at the end of this case.

## 2011-05-14 NOTE — Anesthesia Postprocedure Evaluation (Signed)
  Anesthesia Post-op Note  Patient: Biomedical engineer  Procedure(s) Performed:  POSTERIOR LUMBAR FUSION 1 LEVEL - Lumbar five sacral one Diskectomy, cages, pedicle screws, posterolateral arthrodesis, cellsaver  Patient Location: PACU  Anesthesia Type: General  Level of Consciousness: awake, alert  and oriented  Airway and Oxygen Therapy: Patient Spontanous Breathing  Post-op Pain: none  Post-op Assessment: Post-op Vital signs reviewed, Respiratory Function Stable, Patent Airway, No signs of Nausea or vomiting, Adequate PO intake and Pain level controlled  Post-op Vital Signs: Reviewed and stable  Complications: No apparent anesthesia complications

## 2011-05-15 MED ORDER — DEXTROSE 50 % IV SOLN
INTRAVENOUS | Status: AC
  Filled 2011-05-15: qty 50

## 2011-05-15 NOTE — Progress Notes (Signed)
Subjective: Patient reports  Incisional pain with off and on numbness in both legs. No weakness.Objective:no weakness. Vital signs in last 24 hours: Temp:  [97 F (36.1 C)-99.9 F (37.7 C)] 98.4 F (36.9 C) (11/21 1000) Pulse Rate:  [72-94] 92  (11/21 1000) Resp:  [16-21] 16  (11/21 0800) BP: (93-101)/(53-66) 94/63 mmHg (11/21 1000) SpO2:  [95 %-100 %] 95 % (11/21 1000) Weight:  [72 kg (158 lb 11.7 oz)] 158 lb 11.7 oz (72 kg) (11/20 1313)  Intake/Output from previous day: 11/20 0701 - 11/21 0700 In: 1736 [P.O.:1; I.V.:1725] Out: 1175 [Urine:975; Blood:200] Intake/Output this shift: Total I/O In: 240 [P.O.:240] Out: -   Neurologic: Grossly normal  Lab Results: No results found for this basename: WBC:2,HGB:2,HCT:2,PLT:2 in the last 72 hours BMET No results found for this basename: NA:2,K:2,CL:2,CO2:2,GLUCOSE:2,BUN:2,CREATININE:2,CALCIUM:2 in the last 72 hours  Studies/Results: Dg Lumbar Spine 2-3 Views  05/14/2011  *RADIOLOGY REPORT*  Clinical Data: L5-S1 discectomy and PLIF.  LUMBAR SPINE - 2-3 VIEW  Comparison: CT 03/28/2011.  Findings: Two cross-table lateral views of the lumbar spine are submitted postoperatively from the operating room.  Image #1 at 1030 hours demonstrates skin spreaders posteriorly at L4 and L5. There are blunt surgical instruments overlying the posterior elements at the level of the L4-L5 and L5-S1 facet joints.  Image #2 at 1100 hours demonstrates skin spreaders slightly more inferiorly. Surgical instruments overlie the L5-S1 neural foramina and posterior aspect of the upper sacrum.  IMPRESSION: Intraoperative views during lower lumbar fusion as described.  Original Report Authenticated By: Gerrianne Scale, M.D.   Dg Lumbar Spine 2-3 Views  05/14/2011  *RADIOLOGY REPORT*  Clinical Data: L4-5 fusion.  LUMBAR SPINE - 2-3 VIEW  Comparison: 03/28/2011 CT.  Findings: Two-view intraoperative C-arm views submitted for review after surgery.  This reveals  placement of pedicle screws at the L5 and S1 level with interbody spacer at the L5-S1 disc space level. These appear in satisfactory position on this limited imaging and can be assessed on follow-up.  Mild anterior slip of L5.  IMPRESSION: Fusion L5-S1 as noted above.  Original Report Authenticated By: Fuller Canada, M.D.   Dg C-arm 1-60 Min  05/14/2011  CLINICAL DATA: plif lumbar 5-s1   C-ARM 1-60 MINUTES  Fluoroscopy was utilized by the requesting physician.  No radiographic  interpretation.      Assessment/Plan: Foley out today. Continue morphine.  LOS: 1 day     Camya Haydon M 05/15/2011, 11:29 AM

## 2011-05-15 NOTE — Consult Note (Signed)
Pt in pre-contemplation stage. Refuses counseling and education information.

## 2011-05-15 NOTE — Progress Notes (Signed)
Occupational Therapy Evaluation Patient Details Name: Alicia Tyler MRN: [id number] DOB: [birth year] Today's Date: 05/15/2011  Problem List:  Patient Active Problem List  Diagnoses  . IRRITABLE BOWEL SYNDROME  . Colon adenoma    Past Medical History:  Past Medical History  Diagnosis Date  . Seasonal allergies   . BMI (body mass index) 20.0-29.01 Apr 2010 172 LBS  . 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 Headache   . Anxiety   . PONV (postoperative nausea and vomiting)    Past Surgical History:  Past Surgical History  Procedure Date  . Tubal ligation   . Ovarian cyst removal   . Breast lumpectomy     from the left breast  . Colonoscopy NOV 2011 SCREENING     HYPERPLASTIC RECTAL POLYP, SML IH, incomplete due to discomfort  . Colonoscopy DEC 2011 w/ PROPOFOL    SERRATED ADENOMA, HYPERPLASTIC POLY[S[  . Knee surgery   . Back surgery 05/14/2011    Lumbar Fusion    OT Assessment/Plan/Recommendation OT Assessment Clinical Impression Statement: Patient will benefit from skilled OT in the acute setting to increase independence with ADL and ADL mobility to Mod I/I level upon d/c home.  OT Recommendation/Assessment: Patient will need skilled OT in the acute care venue OT Problem List: Decreased activity tolerance;Decreased knowledge of use of DME or AE;Decreased knowledge of precautions;Pain; PONV OT Therapy Diagnosis : Acute pain OT Plan OT Frequency: Min 2X/week OT Treatment/Interventions: Self-care/ADL training;DME and/or AE instruction;Therapeutic activities;Patient/family education OT Recommendation Follow Up Recommendations: None Equipment Recommended: 3 in 1 bedside comode Individuals Consulted Consulted and Agree with Results and Recommendations: Patient OT Goals Acute Rehab OT Goals OT Goal Formulation: With patient Time For Goal Achievement: 7 days ADL Goals Pt Will Perform Lower Body Bathing: with modified independence;Sit to stand from chair (with AE prn) ADL  Goal: Lower Body Bathing - Progress: Other (comment) Pt Will Perform Lower Body Dressing: with modified independence;Sit to stand from bed;with adaptive equipment ADL Goal: Lower Body Dressing - Progress: Other (comment) Pt Will Transfer to Toilet: with modified independence;Ambulation;3-in-1 ADL Goal: Toilet Transfer - Progress: Other (comment) Pt Will Perform Toileting - Clothing Manipulation: Independently;Standing ADL Goal: Toileting - Clothing Manipulation - Progress: Other (comment) Pt Will Perform Toileting - Hygiene: with modified independence;Sit to stand from 3-in-1/toilet;with adaptive equipment ADL Goal: Toileting - Hygiene - Progress: Other (comment) Pt Will Perform Tub/Shower Transfer: with modified independence;Tub transfer;Ambulation (DME to be determined) ADL Goal: Tub/Shower Transfer - Progress: Other (comment) Additional ADL Goal #1: Verbalize/generalize 3/3 back precautions as precursor to ADL.  OT Evaluation Precautions/Restrictions  Precautions Precautions: Back Precaution Booklet Issued: Yes (comment) Required Braces or Orthoses: Yes Spinal Brace: Lumbar corset;Applied in sitting position Restrictions Weight Bearing Restrictions: No Prior Functioning Home Living Lives With: Spouse;Daughter Type of Home: House Home Layout: One level Home Access: Stairs to enter Entrance Stairs-Rails: None Entrance Stairs-Number of Steps: 5 Bathroom Shower/Tub: Tub/shower unit;Curtain (Shower head attached to wall) Bathroom Toilet: Standard Bathroom Accessibility: No Home Adaptive Equipment: None Prior Function Level of Independence: Independent with basic ADLs;Independent with homemaking with ambulation;Independent with gait;Independent with transfers Driving: Yes Vocation: Unemployed (homemaker) ADL ADL Eating/Feeding: Performed;Set up Where Assessed - Eating/Feeding: Chair Grooming: Wash/dry face;Performed;Set up Where Assessed - Grooming: Sitting, chair Toilet  Transfer: Simulated;Minimal assistance Toilet Transfer Details (indicate cue type and reason): EOB to chair Toilet Transfer Method: Stand pivot Vision/Perception  Vision - History Baseline Vision: No visual deficits Cognition Cognition Arousal/Alertness: Awake/alert Overall Cognitive Status: Appears within functional limits  for tasks assessed Orientation Level: Oriented X4 Sensation/Coordination Sensation Light Touch: Appears Intact Coordination Gross Motor Movements are Fluid and Coordinated: Yes Fine Motor Movements are Fluid and Coordinated: Yes Extremity Assessment RUE Assessment RUE Assessment: Within Functional Limits LUE Assessment LUE Assessment: Within Functional Limits Mobility  Bed Mobility Bed Mobility: Yes Rolling Left: With rail;4: Min assist Rolling Left Details (indicate cue type and reason): Verbal cues for sequence and for precautions, assist to roll to side.  Left Sidelying to Sit: 4: Min assist;HOB elevated (comment degrees);With rails (30) Left Sidelying to Sit Details (indicate cue type and reason): Verbal cues for sequence, assist for upper extremity elevation.  Sitting - Scoot to Edge of Bed: 5: Supervision Sitting - Scoot to [place] of Bed Details (indicate cue type and reason): Verbal cues for sequence.  Transfers Sit to Stand: 4: Min assist;From bed Sit to Stand Details (indicate cue type and reason): Verbal cues for hand placement and sequence, assist for anterior translation.  Stand to Sit: 4: Min assist;To chair/3-in-1;With armrests Stand to Sit Details: Verbal cues for sequence and hand placement, assist for control of descent.  End of Session OT - End of Session Equipment Utilized During Treatment: Gait belt;Back brace Activity Tolerance: Patient limited by pain (and nausea) Patient left: in chair;with call bell in reach;with family/visitor present Nurse Communication: Mobility status for transfers;Mobility status for ambulation General Behavior  During Session: [facility] for tasks performed Cognition: [facility] for tasks performed   Ebunoluwa Gernert 05/15/2011, 10:35 AM

## 2011-05-15 NOTE — Progress Notes (Signed)
Agree with student's assessment.  05/15/2011 Veda Canning, PT Pager: [phone]

## 2011-05-15 NOTE — Progress Notes (Signed)
Pt admitted for, per rept, L5-S1 disectomy fusion with cages and pedicle screws. Pt has decreased ROM of back related to surgery. Pt repts that preop she had numbness and tingling of BLE that pt repts that "she can't tell a decrease" in preop s/sx postop. Pt has a dry dressing of low back and pt is to get up with PT with corset brace this AM. Pt has a foley draining mod amount of straw colored urine. Pt neuro check is negative except pt noted to have slight weakness of BLE. Pt is weak overall due to surgery. Pt repts LBM was Monday and denies nausea or vomiting or passing gas. Pt tolerates diet fair. Lungs CTA. No s/sx resp distress and no c/o such. Pt turns self and floats heels. Pillow between legs for comfort. Pt is lethargic due to pain meds but is A/O x3.

## 2011-05-15 NOTE — Progress Notes (Signed)
Physical Therapy Evaluation Patient Details Name: Alicia Tyler MRN: [id number] DOB: Nov 03, 1970 Today's Date: 05/15/2011  Problem List:  Patient Active Problem List  Diagnoses  . IRRITABLE BOWEL SYNDROME  . Colon adenoma    Past Medical History:  Past Medical History  Diagnosis Date  . Seasonal allergies   . BMI (body mass index) 20.0-29.01 Apr 2010 172 LBS  . 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 Headache   . Anxiety   . PONV (postoperative nausea and vomiting)    Past Surgical History:  Past Surgical History  Procedure Date  . Tubal ligation   . Ovarian cyst removal   . Breast lumpectomy     from the left breast  . Colonoscopy NOV 2011 SCREENING     HYPERPLASTIC RECTAL POLYP, SML IH, incomplete due to discomfort  . Colonoscopy DEC 2011 w/ PROPOFOL    SERRATED ADENOMA, HYPERPLASTIC POLY[S[  . Knee surgery   . Back surgery 05/14/2011    Lumbar Fusion    PT Assessment/Plan/Recommendation PT Assessment Clinical Impression Statement: Pt. with decreased mobility secondary to low back pain rated at a 10.  Session was limited by episode of nausea and vomiting.  Discussed and provided handout to pt. on back precautions.   PT Recommendation/Assessment: Patient will need skilled PT in the acute care venue PT Problem List: Decreased strength;Decreased activity tolerance;Decreased mobility;Decreased knowledge of use of DME;Decreased knowledge of precautions;Decreased safety awareness;Pain Barriers to Discharge: None PT Therapy Diagnosis : Difficulty walking;Acute pain PT Plan PT Frequency: Min 5X/week PT Treatment/Interventions: DME instruction;Gait training;Stair training;Functional mobility training;Patient/family education PT Recommendation Follow Up Recommendations: 24 hour supervision/assistance Equipment Recommended: 3 in 1 bedside comode PT Goals  Acute Rehab PT Goals PT Goal Formulation: With patient Time For Goal Achievement: 7 days Pt will Roll Supine to Left  Side: with modified independence PT Goal: Rolling Supine to Left Side - Progress: Other (comment) Pt will go Supine/Side to Sit: with modified independence PT Goal: Supine/Side to Sit - Progress: Other (comment) Pt will go Sit to Supine/Side: with modified independence PT Goal: Sit to Supine/Side - Progress: Other (comment) Pt will Transfer Sit to Stand/Stand to Sit: with modified independence PT Transfer Goal: Sit to Stand/Stand to Sit - Progress: Other (comment) Pt will Ambulate: >150 feet;with modified independence;with rolling walker PT Goal: Ambulate - Progress: Other (comment) Pt will Go Up / Down Stairs: 3-5 stairs;with modified independence;with least restrictive assistive device PT Goal: Up/Down Stairs - Progress: Other (comment)  PT Evaluation Precautions/Restrictions  Precautions Precautions: Back Precaution Booklet Issued: Yes (comment) Required Braces or Orthoses: Yes Spinal Brace: Lumbar corset;Applied in sitting position Prior Functioning  Home Living Lives With: Spouse;Daughter Type of Home: House Home Layout: One level Home Access: Stairs to enter Entrance Stairs-Rails: None Entrance Stairs-Number of Steps: 5 Bathroom Shower/Tub: Tub/shower unit;Curtain (Shower head attached to wall) Bathroom Toilet: Standard Bathroom Accessibility: No Home Adaptive Equipment: None Prior Function Level of Independence: Independent with basic ADLs;Independent with homemaking with ambulation;Independent with gait;Independent with transfers Driving: Yes Vocation: Unemployed (homemaker) Cognition Cognition Arousal/Alertness: Awake/alert Overall Cognitive Status: Appears within functional limits for tasks assessed Orientation Level: Oriented X4 Sensation/Coordination Sensation Light Touch: Appears Intact Coordination Gross Motor Movements are Fluid and Coordinated: Yes Fine Motor Movements are Fluid and Coordinated: Yes Extremity Assessment RUE Assessment RUE Assessment:  Within Functional Limits LUE Assessment LUE Assessment: Within Functional Limits RLE Assessment RLE Assessment: Within Functional Limits LLE Assessment LLE Assessment: Within Functional Limits Mobility (including Balance) Bed Mobility Bed Mobility: Yes Rolling  Left: With rail;4: Min assist Rolling Left Details (indicate cue type and reason): Verbal cues for sequence and for precautions, assist to roll to side.  Left Sidelying to Sit: 4: Min assist;HOB elevated (comment degrees);With rails (30) Left Sidelying to Sit Details (indicate cue type and reason): Verbal cues for sequence, assist for upper extremity elevation.  Sitting - Scoot to Edge of Bed: 5: Supervision Sitting - Scoot to [place] of Bed Details (indicate cue type and reason): Verbal cues for sequence.  Transfers Transfers: Yes Sit to Stand: 4: Min assist;From bed Sit to Stand Details (indicate cue type and reason): Verbal cues for hand placement and sequence, assist for anterior translation.  Stand to Sit: 4: Min assist;To chair/3-in-1;With armrests Stand to Sit Details: Verbal cues for sequence and hand placement, assist for control of descent.  Stand Pivot Transfers: 4: Min assist Stand Pivot Transfer Details (indicate cue type and reason): Verbal cues for sequence, min assist for safety due to patient experiencing nausea.  Ambulation/Gait Ambulation/Gait: No    Exercise    End of Session PT - End of Session Equipment Utilized During Treatment: Gait belt;Back brace Activity Tolerance: Patient limited by pain (Limited due to N/V) Patient left: in chair;with call bell in reach;with family/visitor present Nurse Communication: Mobility status for transfers General Behavior During Session: [facility] for tasks performed Cognition: [facility] for tasks performed  Laney Pastor, SPT  05/15/2011, 11:31 AM

## 2011-05-16 LAB — CBC
HCT: 30.8 % — ABNORMAL LOW (ref 36.0–46.0)
Hemoglobin: 10.2 g/dL — ABNORMAL LOW (ref 12.0–15.0)
MCHC: 33.1 g/dL (ref 30.0–36.0)
MCV: 89.8 fL (ref 78.0–100.0)
RDW: 12.7 % (ref 11.5–15.5)

## 2011-05-16 MED ORDER — CYCLOBENZAPRINE HCL 10 MG PO TABS
10.0000 mg | ORAL_TABLET | Freq: Three times a day (TID) | ORAL | Status: DC | PRN
  Administered 2011-05-16 – 2011-05-18 (×6): 10 mg via ORAL
  Filled 2011-05-16 (×6): qty 1

## 2011-05-16 MED ORDER — SODIUM CHLORIDE 0.9 % IV BOLUS (SEPSIS)
1000.0000 mL | Freq: Once | INTRAVENOUS | Status: AC
  Administered 2011-05-16: 1000 mL via INTRAVENOUS

## 2011-05-16 MED ORDER — MORPHINE SULFATE (PF) 1 MG/ML IV SOLN
INTRAVENOUS | Status: AC
  Administered 2011-05-16: 1.5 mg via INTRAVENOUS
  Filled 2011-05-16: qty 25

## 2011-05-16 NOTE — Progress Notes (Signed)
PT/OT/SLP Cancellation Note  Treatment cancelled today due to patient ambulating in hallway x 2 with family member.  Will defer treatment today and resume PT on Friday.   05/16/2011 Olivia Canter, PT [phone]

## 2011-05-16 NOTE — Progress Notes (Signed)
Dr. Jeral Fruit called regarding manual BP 80/44, pulse 96. Pt complaining of nausea and dizziness when sitting that is resolved when lying down. CBC ordered STAT and fluids increased to 125cc/hr from 50cc/hr. BP will continue to be monitored.   Salvadore Oxford, RN

## 2011-05-16 NOTE — Progress Notes (Signed)
Subjective: Patient reports Back pain but is improving. she denies any Pain or no new numbness or tingling. She had some problems with her blood pressure dipping early in the morning blood work was sent that showed her hematocrit to be stable at 30.8. We will begin her fluid bolus and help with a blood pressure.  Objective: Vital signs in last 24 hours: Temp:  [98.2 F (36.8 C)-99.5 F (37.5 C)] 98.2 F (36.8 C) (11/22 1011) Pulse Rate:  [84-101] 91  (11/22 1011) Resp:  [14-24] 16  (11/22 1011) BP: (80-100)/(44-68) 98/60 mmHg (11/22 1011) SpO2:  [95 %-99 %] 98 % (11/22 1011)   Neurologically she is intact with 5 out of 5 strength in both lower extremities.. Her incision is clean and dry. Intake/Output from previous day: 11/21 0701 - 11/22 0700 In: 480 [P.O.:480] Out: 1701 [Urine:1700; Emesis/NG output:1] Intake/Output this shift:      Lab Results:  Basename 05/16/11 0600  WBC 11.9*  HGB 10.2*  HCT 30.8*  PLT 178   BMET No results found for this basename: NA:2,K:2,CL:2,CO2:2,GLUCOSE:2,BUN:2,CREATININE:2,CALCIUM:2 in the last 72 hours  Studies/Results: Dg Lumbar Spine 2-3 Views  05/14/2011  *RADIOLOGY REPORT*  Clinical Data: L5-S1 discectomy and PLIF.  LUMBAR SPINE - 2-3 VIEW  Comparison: CT 03/28/2011.  Findings: Two cross-table lateral views of the lumbar spine are submitted postoperatively from the operating room.  Image #1 at 1030 hours demonstrates skin spreaders posteriorly at L4 and L5. There are blunt surgical instruments overlying the posterior elements at the level of the L4-L5 and L5-S1 facet joints.  Image #2 at 1100 hours demonstrates skin spreaders slightly more inferiorly. Surgical instruments overlie the L5-S1 neural foramina and posterior aspect of the upper sacrum.  IMPRESSION: Intraoperative views during lower lumbar fusion as described.  Original Report Authenticated By: Gerrianne Scale, M.D.   Dg Lumbar Spine 2-3 Views  05/14/2011  *RADIOLOGY REPORT*   Clinical Data: L4-5 fusion.  LUMBAR SPINE - 2-3 VIEW  Comparison: 03/28/2011 CT.  Findings: Two-view intraoperative C-arm views submitted for review after surgery.  This reveals placement of pedicle screws at the L5 and S1 level with interbody spacer at the L5-S1 disc space level. These appear in satisfactory position on this limited imaging and can be assessed on follow-up.  Mild anterior slip of L5.  IMPRESSION: Fusion L5-S1 as noted above.  Original Report Authenticated By: Fuller Canada, M.D.   Dg C-arm 1-60 Min  05/14/2011  CLINICAL DATA: plif lumbar 5-s1   C-ARM 1-60 MINUTES  Fluoroscopy was utilized by the requesting physician.  No radiographic  interpretation.      Assessment/Plan: We'll progressively mobilize her today with physical therapy she requested to continue the PCA 1 additional day. We discussed up in 9 the morning changing her over to intermittent injections and oral pain medication with the thoughts of potential discharged by Saturday or Sunday.  LOS: 2 days     Emmauel Hallums P 05/16/2011, 10:24 AM

## 2011-05-17 MED ORDER — MORPHINE SULFATE 2 MG/ML IJ SOLN: 2.0000 mg | INTRAMUSCULAR | Status: DC | PRN

## 2011-05-17 NOTE — Progress Notes (Signed)
Subjective: Patient reports Continue back pain although it does feel like it is improving to her. And some muscle spasms or tracking down her right buttock and upper right leg. No other new numbness or tingling.  Objective: Vital signs in last 24 hours: Temp:  [98.5 F (36.9 C)-101.9 F (38.8 C)] 98.5 F (36.9 C) (11/23 1020) Pulse Rate:  [60-110] 107  (11/23 1020) Resp:  [14-21] 19  (11/23 1020) BP: (94-124)/(57-77) 118/77 mmHg (11/23 1020) SpO2:  [91 %-100 %] 100 % (11/23 1020)  Intake/Output from previous day: 11/22 0701 - 11/23 0700 In: 1000 [I.V.:1000] Out: -  Intake/Output this shift:    Her wound is clean and dry. Lower extremity strength remains 5 out of 5.  Lab Results:  Basename 05/16/11 0600  WBC 11.9*  HGB 10.2*  HCT 30.8*  PLT 178   BMET No results found for this basename: NA:2,K:2,CL:2,CO2:2,GLUCOSE:2,BUN:2,CREATININE:2,CALCIUM:2 in the last 72 hours  Studies/Results: No results found.  Assessment/Plan: Patient is now postop day 3 from lumbar stabilization progressing slowly. We will stop her PCA today start her on intermittent morphine injections in addition to her oral analgesics and muscle relaxers. Continue mobilization with physical therapy  LOS: 3 days     Alicia Tyler P 05/17/2011, 10:28 AM

## 2011-05-17 NOTE — Progress Notes (Signed)
PCA d/c, wasting 15mg  of Morphine in sink with second. RN, Elder Negus, RN., Clare Gandy, RN.

## 2011-05-17 NOTE — Progress Notes (Signed)
Utilization review completed. Donaldo Teegarden, RN, BSN. 05/17/11 

## 2011-05-17 NOTE — Progress Notes (Signed)
PT/OT/SLP Cancellation Note  Treatment cancelled today due to patient's refusal to participate  - patient has been ambulating in hallway with nursing.  Refused PT politely x2 today.  Will return in am - need to instruct patient in stairs.  Durenda Hurt Renaldo Fiddler, [facility] Acute Rehab Services Pager [phone]

## 2011-05-17 NOTE — Progress Notes (Signed)
Occupational Therapy Treatment Note Patient Details Name: Vicky Schleich MRN: [id number] DOB: 1971/01/17 Today's Date: 05/17/2011  OT Assessment/Plan OT Assessment/Plan OT Plan: Discharge plan needs to be updated Follow Up Recommendations: Home health OT Equipment Recommended: 3 in 1 bedside comode OT Goals ADL Goals ADL Goal: Toilet Transfer - Progress: Progressing toward goals ADL Goal: Toileting - Clothing Manipulation - Progress: Progressing toward goals ADL Goal: Toileting - Hygiene - Progress: Progressing toward goals ADL Goal: Tub/Shower Transfer - Progress: Progressing toward goals  OT Treatment Precautions/Restrictions  Precautions Precautions: Fall;Back Spinal Brace: Lumbar corset;Applied in sitting position   ADL ADL Grooming: Performed;Supervision/safety;Wash/dry hands;Teeth care Where Assessed - Grooming: Standing at sink Toilet Transfer: Performed (Min guard A) Toilet Transfer Method: Proofreader: Raised toilet seat with arms (or 3-in-1 over toilet) Toileting - Clothing Manipulation: Performed;Supervision/safety Where Assessed - Glass blower/designer Manipulation: Standing Toileting - Hygiene: Performed (Min guard A and set up) Where Assessed - Toileting Hygiene: Sit to stand from 3-in-1 or toilet Tub/Shower Transfer: Simulated;Minimal assistance Tub/Shower Transfer Details (indicate cue type and reason): simulated in room with sideways entry and exit ADL Comments: Demonstrated use of and educated patient on AE (long handled sponge, long handled shoe horn, reacher and sock aide). Patient did not wish to practice with equipment at this time. Ambulated out into hallway and back with Min Guard A. Mobility  Bed Mobility Rolling Right: 5: Supervision Rolling Right Details (indicate cue type and reason): VC to maintain back precautions Right Sidelying to Sit: 4: Min assist Right Sidelying to Sit Details (indicate cue type and reason): VC for  technique and to maintain back precautions. assist needed to achieve upright sitting Sitting - Scoot to Edge of Bed: 6: Modified independent (Device/Increase time);With rail Transfers Sit to Stand: From bed;From chair/3-in-1 (Min guard A) Stand to Sit: 5: Supervision Stand to Sit Details: VC for hand placement Exercises    End of Session OT - End of Session Equipment Utilized During Treatment: Gait belt;Back brace Activity Tolerance: Patient limited by pain Patient left: in bed Nurse Communication: Mobility status for transfers;Mobility status for ambulation General Behavior During Session: [facility] for tasks performed Cognition: [facility] for tasks performed  Maylee Bare  05/17/2011, 3:50 PM

## 2011-05-18 MED ORDER — CYCLOBENZAPRINE HCL 10 MG PO TABS
10.0000 mg | ORAL_TABLET | Freq: Three times a day (TID) | ORAL | Status: AC | PRN
Start: 2011-05-18 — End: 2011-05-28

## 2011-05-18 MED ORDER — DSS 100 MG PO CAPS: 100.0000 mg | ORAL_CAPSULE | Freq: Two times a day (BID) | ORAL | Status: AC

## 2011-05-18 MED ORDER — OXYCODONE-ACETAMINOPHEN 5-325 MG PO TABS: 1.0000 | ORAL_TABLET | ORAL | Status: AC | PRN

## 2011-05-18 NOTE — Discharge Summary (Signed)
Physician Discharge Summary  Patient ID: Alicia Tyler MRN: [id number] DOB/AGE: Jan 11, 1971 39 y.o.  Admit date: 05/14/2011 Discharge date: 05/18/2011  Admission Diagnoses:  Discharge Diagnoses:  Active Problems:  * No active hospital problems. *    Discharged Condition: good  Hospital Course: The patient was admitted to [facility] by Dr. Jeral Fruit on 05/14/2011. On that day the patient underwent a lumbar fusion. For further details of the operation please refer to typed operative note.  Patient's postoperative course was unremarkable. She was seen by PT and OT. By 05/18/2011 patient was afebrile, her vital signs were stable, and she was requesting discharge to home. I gave her her discharge instructions and answered all her questions.  Consults: None Significant Diagnostic Studies: None Treatments: Lumbar fusion as above Discharge Exam: Blood pressure 92/56, pulse 84, temperature 97.9 F (36.6 C), temperature source Oral, resp. rate 24, height 5\' 6"  (1.676 m), weight 72 kg (158 lb 11.7 oz), last menstrual period 05/04/2011, SpO2 99.00%. The patient is alert and oriented. Her lower surgery motor strength is grossly normal. Her wound is healing well.  Disposition: Home  Discharge Orders    Future Orders Please Complete By Expires   Diet - low sodium heart healthy      Increase activity slowly      Discharge instructions      Comments:   The patient was given written and oral discharge instructions. All her questions were answered.   No dressing needed      Call MD for:      Call MD for:  temperature >100.4      Call MD for:  persistant nausea and vomiting      Call MD for:  severe uncontrolled pain      Call MD for:  redness, tenderness, or signs of infection (pain, swelling, redness, odor or green/yellow discharge around incision site)      Call MD for:  difficulty breathing, headache or visual disturbances      Call MD for:  hives      Call MD for:  persistant  dizziness or light-headedness      Call MD for:  extreme fatigue        Current Discharge Medication List    START taking these medications   Details  cyclobenzaprine (FLEXERIL) 10 MG tablet Take 1 tablet (10 mg total) by mouth 3 (three) times daily as needed for muscle spasms (Muscle spasms). Qty: 90 tablet, Refills: 1    docusate sodium 100 MG CAPS Take 100 mg by mouth 2 (two) times daily. Qty: 60 capsule, Refills: 1    oxyCODONE-acetaminophen (PERCOCET) 5-325 MG per tablet Take 1-2 tablets by mouth every 4 (four) hours as needed. Qty: 100 tablet, Refills: 0      CONTINUE these medications which have NOT CHANGED   Details  ALPRAZolam (XANAX) 0.25 MG tablet Take 0.25 mg by mouth as needed. For anxiety      Biotin 5000 MCG CAPS Take 5,000 mcg by mouth 2 (two) times daily.      Cholecalciferol (VITAMIN D) 2000 UNITS tablet Take 2,000 Units by mouth daily.      gabapentin (NEURONTIN) 100 MG tablet Take 300 mg by mouth 2 (two) times daily. Stopped taking on 11-13 due to surgery    loratadine (CLARITIN) 10 MG tablet Take 10 mg by mouth daily.      lubiprostone (AMITIZA) 24 MCG capsule Take 24 mcg by mouth 2 (two) times daily with a meal.  rizatriptan (MAXALT-MLT) 10 MG disintegrating tablet Take 10 mg by mouth as needed. For headache, May repeat in 2 hours if needed    topiramate (TOPAMAX) 50 MG tablet Take 50 mg by mouth 2 (two) times daily.       STOP taking these medications     HYDROcodone-acetaminophen (NORCO) 10-325 MG per tablet      ibuprofen (ADVIL,MOTRIN) 200 MG tablet          Signed: Ronalda Walpole D 05/18/2011, 9:59 AM

## 2011-05-18 NOTE — Progress Notes (Signed)
Physical Therapy Treatment Patient Details Name: Alicia Tyler MRN: [id number] DOB: [birth year] Today's Date: 05/18/2011  PT Assessment/Plan  PT - Assessment/Plan Comments on Treatment Session: Patient did well with mobility, gait, and stairs today.  Husband able to assist patient safely.  Ready for discharge from PT standpoint. PT Plan: Frequency remains appropriate;Discharge plan remains appropriate PT Goals  Acute Rehab PT Goals PT Goal: Rolling Supine to Left Side - Progress: Met PT Goal: Supine/Side to Sit - Progress: Partly met (Requires supervision for safety) PT Transfer Goal: Sit to Stand/Stand to Sit - Progress: Partly met (Supervision for safety) PT Goal: Ambulate - Progress: Met PT Goal: Up/Down Stairs - Progress: Met  PT Treatment Precautions/Restrictions  Precautions Precautions: Back Precaution Booklet Issued: Yes (comment) Required Braces or Orthoses: Yes Spinal Brace: Lumbar corset Restrictions Weight Bearing Restrictions: No Mobility (including Balance) Bed Mobility Rolling Left: 5: Supervision;With rail Rolling Left Details (indicate cue type and reason): Reminders for no twisting back precaution Left Sidelying to Sit: 5: Supervision;HOB flat Left Sidelying to Sit Details (indicate cue type and reason): Verbal cues for technique Sitting - Scoot to Edge of Bed: 7: Independent Transfers Transfers: Yes Sit to Stand: 5: Supervision;With upper extremity assist;From bed Sit to Stand Details (indicate cue type and reason): Verbal cues for safe hand placement Stand to Sit: 5: Supervision Ambulation/Gait Ambulation/Gait: Yes Ambulation/Gait Assistance: 5: Supervision Ambulation/Gait Assistance Details (indicate cue type and reason): Cues to look up during gait.  Safe use of RW Ambulation Distance (Feet): 200 Feet Assistive device: Rolling walker Gait Pattern: Step-through pattern;Decreased stride length Gait velocity: Slow gait speed Stairs: Yes Stairs  Assistance: 4: Min assist Stairs Assistance Details (indicate cue type and reason): Instructed patient and spouse on safe technique for stairs.  Spouse able to assist patient safely. Stair Management Technique: No rails;Step to pattern;Forwards (Hand hold assist) Number of Stairs: 5  Wheelchair Mobility Wheelchair Mobility: No      End of Session PT - End of Session Equipment Utilized During Treatment: Back brace;Gait belt Activity Tolerance: Patient tolerated treatment well Patient left: in bed;with call bell in reach;with family/visitor present (Sitting on edge of bed) Nurse Communication:  (Ready for discharge from PT standpoint) General Behavior During Session: [facility] for tasks performed Cognition: [facility] for tasks performed  Vena Austria [phone] 05/18/2011, 1:46 PM

## 2011-05-18 NOTE — Plan of Care (Signed)
Problem: Consults Goal: Diagnosis - Spinal Surgery Outcome: Completed/Met Date Met:  05/18/11 Lumbar Laminectomy (Complex)

## 2011-05-18 NOTE — Progress Notes (Signed)
Case Management:  05/18/11 1141 Spoke with Mardella Layman, [facility] to make referral for 3-N-1, rolling walker and to let know of discharge today. Tera Mater, RN, BSN Case Manager # [phone]

## 2011-05-20 MED FILL — Sodium Chloride IV Soln 0.9%: INTRAVENOUS | Qty: 1000 | Status: AC

## 2011-05-20 MED FILL — Heparin Sodium (Porcine) Inj 1000 Unit/ML: INTRAMUSCULAR | Qty: 30 | Status: AC

## 2011-05-20 MED FILL — Sodium Chloride Irrigation Soln 0.9%: Qty: 3000 | Status: AC

## 2011-06-26 ENCOUNTER — Encounter: Payer: Self-pay | Admitting: Gastroenterology

## 2011-07-10 ENCOUNTER — Ambulatory Visit (INDEPENDENT_AMBULATORY_CARE_PROVIDER_SITE_OTHER): Payer: Medicaid Other | Admitting: Gastroenterology

## 2011-07-10 ENCOUNTER — Encounter: Payer: Self-pay | Admitting: Gastroenterology

## 2011-07-10 VITALS — BP 152/85 | HR 97 | Temp 98.2°F | Ht 64.0 in | Wt 152.2 lb

## 2011-07-10 DIAGNOSIS — K589 Irritable bowel syndrome without diarrhea: Secondary | ICD-10-CM

## 2011-07-10 MED ORDER — LUBIPROSTONE 24 MCG PO CAPS: ORAL_CAPSULE | ORAL | Status: DC

## 2011-07-10 MED ORDER — DOCUSATE SODIUM 100 MG PO CAPS: ORAL_CAPSULE | ORAL | Status: DC

## 2011-07-10 NOTE — Progress Notes (Signed)
Subjective:    Patient ID: Alicia Tyler, female    DOB: 08-09-1970, 41 y.o.   MRN: [id number]  PCP: [facility]  HPI HAD LUMBAR FUSION NOV 2012. Taking pain meds. Constipation worse. Was getting Amitiza free. Needs new Rx. Taking [employer]: almost everty day. ABD Pain: no. Was having fluid retention & took diuretics & had better Bms.  Past Medical History  Diagnosis Date  . Seasonal allergies   . BMI (body mass index) 20.0-29.01 Apr 2010 172 LBS  . 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 Headache   . Anxiety   . PONV (postoperative nausea and vomiting)     Past Surgical History  Procedure Date  . Tubal ligation   . Ovarian cyst removal   . Breast lumpectomy     from the left breast  . Colonoscopy NOV 2011 SCREENING     HYPERPLASTIC RECTAL POLYP, SML IH, incomplete due to discomfort  . Colonoscopy DEC 2011 w/ PROPOFOL    SERRATED ADENOMA, HYPERPLASTIC POLY[S[  . Knee surgery   . Back surgery 05/14/2011    Lumbar Fusion    Allergies  Allergen Reactions  . Beesix (Pyridoxine Hcl) Anaphylaxis  . Penicillins Other (See Comments)    Makes hair fall out  . Aspirin Swelling and Rash  . Codeine Nausea And Vomiting and Rash    Current Outpatient Prescriptions  Medication Sig Dispense Refill  . ALPRAZolam (XANAX) 0.25 MG tablet Take 0.25 mg by mouth as needed. For anxiety        . Biotin 5000 MCG CAPS Take 5,000 mcg by mouth 2 (two) times daily.        . Cholecalciferol (VITAMIN D) 2000 UNITS tablet Take 2,000 Units by mouth daily.        . cyclobenzaprine (FLEXERIL) 10 MG tablet Take 10 mg by mouth 3 (three) times daily as needed.    . docusate sodium (COLACE) 100 MG capsule 1 po BID    . furosemide (LASIX) 20 MG tablet Take 20 mg by mouth daily.    Marland Kitchen gabapentin (NEURONTIN) 100 MG tablet Take 300 mg by mouth 2 (two) times daily. Stopped taking on 11-13 due to surgery    . loratadine (CLARITIN) 10 MG tablet Take 10 mg by mouth daily.      Marland Kitchen lubiprostone (AMITIZA) 24 MCG capsule 1 po  bid    . oxyCODONE-acetaminophen (PERCOCET) 7.5-325 MG per tablet Take 1 tablet by mouth every 6 (six) hours as needed.      . topiramate (TOPAMAX) 50 MG tablet Take 50 mg by mouth 2 (two) times daily.       . rizatriptan (MAXALT-MLT) 10 MG disintegrating tablet Take 10 mg by mouth as needed. For headache, May repeat in 2 hours if needed          Review of Systems     Objective:   Physical Exam  Vitals reviewed. Constitutional: She is oriented to person, place, and time. She appears well-developed and well-nourished. No distress.  HENT:  Head: Normocephalic and atraumatic.  Mouth/Throat: Oropharynx is clear and moist. No oropharyngeal exudate.  Neck: Normal range of motion. Neck supple.  Cardiovascular: Normal rate, regular rhythm and normal heart sounds.   Pulmonary/Chest: Effort normal and breath sounds normal. No respiratory distress.  Abdominal: Soft. Bowel sounds are normal. She exhibits no distension. There is no tenderness.  Musculoskeletal:       WELL HEALED INCISION MIDLINE LUMBAR REGION PT STANDING IN BRACE  Neurological: She is alert and oriented  to person, place, and time.       NO  NEW FOCAL DEFICITS           Assessment & Plan:

## 2011-07-10 NOTE — Patient Instructions (Signed)
CONTINUE AMITIZA.  USE COLACE THREE TIMES A DAY.  DRINK 6 TO 8 CUPS WATER DAILY.  EAT A HIGH FIBER DIET. AVOID ITEMS THAT CAUSE BLOATING AND GAS.  FOLLOW UP IN 6 MOS.   

## 2011-07-10 NOTE — Progress Notes (Signed)
Faxed to PCP

## 2011-07-10 NOTE — Assessment & Plan Note (Addendum)
Sx worse with pain meds for back surgery.   CONTINUE AMITIZA. COLACE TID. OPV IN 6 MOS.  DICSUSSED MANAGEMENT OF CONSTIPATION.

## 2011-07-19 NOTE — Progress Notes (Signed)
Reminder in epic to follow up in 6 months °

## 2011-10-31 ENCOUNTER — Telehealth: Payer: Self-pay

## 2011-10-31 NOTE — Telephone Encounter (Signed)
Pt does not have papers for Dr. Darrick Penna to complete. She said she needs for her to write a letter for her to present for her disability. It should include her diagnosis, meds she has been treated with, and info pertaining to procedures. She is aware if she does this, there will be a $29.00 fee that must be paid up front.

## 2011-11-04 NOTE — Telephone Encounter (Signed)
Called pt and informed per Dr. Darrick Penna, paper work will be ready by 12 noon on 11/06/2011.

## 2011-11-04 NOTE — Telephone Encounter (Signed)
Please call pt. Her paperwork will ready by MAY AT 12 NOON.

## 2011-11-11 ENCOUNTER — Other Ambulatory Visit: Payer: Self-pay | Admitting: Gastroenterology

## 2012-01-02 ENCOUNTER — Encounter: Payer: Self-pay | Admitting: Gastroenterology

## 2012-01-02 ENCOUNTER — Ambulatory Visit (INDEPENDENT_AMBULATORY_CARE_PROVIDER_SITE_OTHER): Payer: Medicaid Other | Admitting: Gastroenterology

## 2012-01-02 VITALS — BP 103/65 | HR 75 | Temp 98.2°F | Ht 63.5 in | Wt 137.4 lb

## 2012-01-02 DIAGNOSIS — D126 Benign neoplasm of colon, unspecified: Secondary | ICD-10-CM

## 2012-01-02 DIAGNOSIS — K589 Irritable bowel syndrome without diarrhea: Secondary | ICD-10-CM

## 2012-01-02 NOTE — Progress Notes (Signed)
Reminder in epic to follow up in 6 months with SF °

## 2012-01-02 NOTE — Assessment & Plan Note (Signed)
TCS IN 2016 

## 2012-01-02 NOTE — Progress Notes (Signed)
Faxed to PCP

## 2012-01-02 NOTE — Assessment & Plan Note (Addendum)
FAIRLY WELL CONTROLLED.  CONTINUE COLACE, AMITIZA, & PRN LAXATIVE 1 OR 2 TIMES A WEEK. HIGH FIBER DIET. DRINK WATER.  OPV IN 6 MOS.

## 2012-01-02 NOTE — Progress Notes (Signed)
Subjective:    Patient ID: Alicia Tyler, female    DOB: 01-28-1971, 41 y.o.   MRN: [id number]  PCP: [facility]  HPI "BIG MESS"-problems with feet, toes. Out OF BACK BRACE. NOW HAS PROBLEMS WITH HANDS. BMs: OTC COLACE, AMITIZA, PLUS STOOL LAXATIVE. Sometimes may go a week without a BM. FOCUSED ON HAVING HER TCS IN 3 YEARS. HAVING PROCEDURES ON CERVIX. GOING "THROUGH MENOPAUSE". STATES SHE'S LOST 3.5 INCHES. LAST BM YESTERDAY, BUT WATERY.  Past Medical History  Diagnosis Date  . Seasonal allergies   . BMI (body mass index) 20.0-29.01 Apr 2010 172 LBS  . 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 Headache   . Anxiety   . PONV (postoperative nausea and vomiting)     Past Surgical History  Procedure Date  . Tubal ligation   . Ovarian cyst removal   . Breast lumpectomy     from the left breast  . Colonoscopy NOV 2011 SCREENING     HYPERPLASTIC RECTAL POLYP, SML IH, incomplete due to discomfort  . Colonoscopy DEC 2011 w/ PROPOFOL    SERRATED ADENOMA, HYPERPLASTIC POLY[S[  . Knee surgery   . Back surgery 05/14/2011    Lumbar Fusion    Allergies  Allergen Reactions  . Beesix (Pyridoxine Hcl) Anaphylaxis  . Penicillins Other (See Comments)    Makes hair fall out  . Aspirin Swelling and Rash  . Codeine Nausea And Vomiting and Rash    Current Outpatient Prescriptions  Medication Sig Dispense Refill  . ALPRAZolam (XANAX) 0.25 MG tablet Take 0.25 mg by mouth as needed. For anxiety        . Biotin 5000 MCG CAPS Take 5,000 mcg by mouth 2 (two) times daily.        . Cholecalciferol (VITAMIN D) 2000 UNITS tablet Take 2,000 Units by mouth daily.        . cyclobenzaprine (FLEXERIL) 10 MG tablet Take 10 mg by mouth 3 (three) times daily as needed.      . diazepam (VALIUM) 10 MG tablet Take 10 mg by mouth every 8 (eight) hours as needed.      Marland Kitchen FIBER SELECT GUMMIES PO Take by mouth.      . furosemide (LASIX) 20 MG tablet Take 20 mg by mouth daily.      Marland Kitchen gabapentin (NEURONTIN) 100 MG tablet Take  300 mg by mouth 2 (two) times daily. Stopped taking on 11-13 due to surgery      . HM STOOL SOFTENER 100 MG capsule TAKE 1 CAPSULE BY MOUTH THREE TIMES DAILY    . loratadine (CLARITIN) 10 MG tablet Take 10 mg by mouth daily.      Marland Kitchen lubiprostone (AMITIZA) 24 MCG capsule 1 po bid    . meloxicam (MOBIC) 15 MG tablet Take 15 mg by mouth daily.      Marland Kitchen oxyCODONE (ROXICODONE) 15 MG immediate release tablet Take 15 mg by mouth every 4 (four) hours as needed.      . rizatriptan (MAXALT-MLT) 10 MG disintegrating tablet Take 10 mg by mouth as needed. For headache, May repeat in 2 hours if needed      . topiramate (TOPAMAX) 50 MG tablet Take 50 mg by mouth 2 (two) times daily.       Marland Kitchen oxyCODONE-acetaminophen (PERCOCET) 7.5-325 MG per tablet Take 1 tablet by mouth every 6 (six) hours as needed.          Review of Systems     Objective:   Physical Exam  Vitals reviewed. Constitutional: She is oriented to person, place, and time. She appears well-nourished. No distress.  HENT:  Head: Normocephalic and atraumatic.  Mouth/Throat: Oropharynx is clear and moist. No oropharyngeal exudate.  Eyes: Pupils are equal, round, and reactive to light. No scleral icterus.  Neck: Normal range of motion. Neck supple.  Cardiovascular: Normal rate, regular rhythm and normal heart sounds.   Pulmonary/Chest: Effort normal and breath sounds normal. No respiratory distress.  Abdominal: Soft. Bowel sounds are normal. She exhibits no distension. There is no tenderness.  Musculoskeletal: She exhibits no edema.  Neurological: She is alert and oriented to person, place, and time.       NO  NEW FOCAL DEFICITS   Psychiatric:       NL MOOD, FLAT AFFECT-FOCUSES ON HAVING PROCEDURES NOW AND IN THE FUTURE.          Assessment & Plan:

## 2012-01-02 NOTE — Patient Instructions (Signed)
CONTINUE AMITIZA.  USE COLACE THREE TIMES A DAY.  DRINK 6 TO 8 CUPS WATER DAILY.  EAT A HIGH FIBER DIET. AVOID ITEMS THAT CAUSE BLOATING AND GAS.  FOLLOW UP IN 6 MOS.

## 2012-03-11 ENCOUNTER — Other Ambulatory Visit: Payer: Self-pay | Admitting: Neurosurgery

## 2012-03-11 DIAGNOSIS — M542 Cervicalgia: Secondary | ICD-10-CM

## 2012-03-19 ENCOUNTER — Ambulatory Visit
Admission: RE | Admit: 2012-03-19 | Discharge: 2012-03-19 | Disposition: A | Discharge status: Home / Self Care | Payer: Medicaid Other | Source: Ambulatory Visit | Attending: Neurosurgery | Admitting: Neurosurgery

## 2012-03-19 DIAGNOSIS — M542 Cervicalgia: Secondary | ICD-10-CM

## 2012-06-05 ENCOUNTER — Encounter: Payer: Self-pay | Admitting: *Deleted

## 2012-07-09 ENCOUNTER — Ambulatory Visit (INDEPENDENT_AMBULATORY_CARE_PROVIDER_SITE_OTHER): Payer: Medicaid Other | Admitting: Gastroenterology

## 2012-07-09 ENCOUNTER — Encounter: Payer: Self-pay | Admitting: Gastroenterology

## 2012-07-09 VITALS — BP 110/71 | HR 87 | Temp 97.4°F | Ht 65.0 in | Wt 133.0 lb

## 2012-07-09 DIAGNOSIS — K589 Irritable bowel syndrome without diarrhea: Secondary | ICD-10-CM

## 2012-07-09 DIAGNOSIS — D126 Benign neoplasm of colon, unspecified: Secondary | ICD-10-CM

## 2012-07-09 MED ORDER — LUBIPROSTONE 24 MCG PO CAPS: ORAL_CAPSULE | ORAL | Status: DC

## 2012-07-09 NOTE — Progress Notes (Signed)
Subjective:    Patient ID: Alicia Tyler, female    DOB: Feb 06, 1971, 42 y.o.   MRN: [id number]  PCP: [facility]  HPI PT LOST 4 LBS SINCE LAST VISIT. HAS BEEN UNDER STRESS. STILL TAKING AMITIZA. BMs: MAY BE BOUND UP FOR A WEEK OR TWO. RX: STOOL SOFTENER/COLACE/AMITROZA AND GETS TO GO. STAYS COLD. RARE DIARRHEA AFTER SHE EATS. AMY HAVE INTERMITTENT DIARRHEA/CONSTIPATION. ASSOCIATED WITH ABD PAIN IN BLQS WHEN SHE'S BOUND UP.  PT DENIES FEVER, CHILLS, BRBPR, nausea, vomiting, melena, abd pain, problems swallowing, heartburn or indigestion.  Past Medical History  Diagnosis Date  . Seasonal allergies   . BMI (body mass index) 20.0-29.01 Apr 2010 172 LBS  . 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 Headache   . Anxiety   . PONV (postoperative nausea and vomiting)     Past Surgical History  Procedure Date  . Tubal ligation   . Ovarian cyst removal   . Breast lumpectomy     from the left breast  . Colonoscopy NOV 2011 SCREENING     HYPERPLASTIC RECTAL POLYP, SML IH, incomplete due to discomfort  . Colonoscopy DEC 2011 w/ PROPOFOL    SERRATED ADENOMA, HYPERPLASTIC POLY[S[  . Knee surgery   . Back surgery 05/14/2011    Lumbar Fusion   Allergies  Allergen Reactions  . Beesix (Pyridoxine Hcl) Anaphylaxis  . Penicillins Other (See Comments)    Makes hair fall out  . Aspirin Swelling and Rash  . Codeine Nausea And Vomiting and Rash   Current Outpatient Prescriptions  Medication Sig Dispense Refill  . ALPRAZolam (XANAX) 0.25 MG tablet Take 0.25 mg by mouth as needed. For anxiety        . Biotin 5000 MCG CAPS Take 5,000 mcg by mouth 2 (two) times daily.        . Cholecalciferol (VITAMIN D) 2000 UNITS tablet Take 2,000 Units by mouth daily.        . diazepam (VALIUM) 10 MG tablet Take 10 mg by mouth every 8 (eight) hours as needed.  FOR MUSCLE SPASM    . FIBER SELECT GUMMIES PO Take by mouth.      . gabapentin (NEURONTIN) 100 MG tablet Take 300 mg by mouth 2 (two) times daily. Stopped taking on  11-13 due to surgery      . HM STOOL SOFTENER 100 MG capsule TAKE 1 CAPSULE BY MOUTH THREE TIMES DAILY    . loratadine (CLARITIN) 10 MG tablet Take 10 mg by mouth daily.      Marland Kitchen lubiprostone (AMITIZA) 24 MCG capsule 1 po bid    . meloxicam (MOBIC) 15 MG tablet Take 15 mg by mouth daily.      Marland Kitchen oxyCODONE (ROXICODONE) 15 MG immediate release tablet Take 15 mg by mouth every 4 (four) hours as needed.      . rizatriptan (MAXALT-MLT) 10 MG disintegrating tablet Take 10 mg by mouth as needed. For headache, May repeat in 2 hours if needed  4X LAST MO    . topiramate (TOPAMAX) 50 MG tablet Take 50 mg by mouth 2 (two) times daily.       .        . furosemide (LASIX) 20 MG tablet Take 20 mg by mouth daily.      .              Review of Systems     Objective:   Physical Exam  Vitals reviewed. Constitutional: She is oriented to person, place, and time.  She appears well-nourished. No distress.  HENT:  Head: Normocephalic and atraumatic.  Mouth/Throat: Oropharynx is clear and moist. No oropharyngeal exudate.  Eyes: Pupils are equal, round, and reactive to light. No scleral icterus.  Cardiovascular: Normal rate, regular rhythm and normal heart sounds.   Pulmonary/Chest: Effort normal and breath sounds normal. No respiratory distress.  Abdominal: Soft. Bowel sounds are normal. She exhibits no distension. There is no tenderness.  Musculoskeletal: She exhibits no edema.  Neurological: She is alert and oriented to person, place, and time.       NO FOCAL DEFICITS   Psychiatric: She has a normal mood and affect.          Assessment & Plan:

## 2012-07-09 NOTE — Patient Instructions (Signed)
FOLLOW A HIGH FIBER DIET. SEE INFO BELOW.  DRINK WATER TO KEEP URINE LIGHT YELLOW.  CONTINUE AMITIZA.  FOLLOW UP IN ONE YEAR.    High-Fiber Diet A high-fiber diet changes your normal diet to include more whole grains, legumes, fruits, and vegetables. Changes in the diet involve replacing refined carbohydrates with unrefined foods. The calorie level of the diet is essentially unchanged. The Dietary Reference Intake (recommended amount) for adult males is 38 grams per day. For adult females, it is 25 grams per day. Pregnant and lactating women should consume 28 grams of fiber per day. Fiber is the intact part of a plant that is not broken down during digestion. Functional fiber is fiber that has been isolated from the plant to provide a beneficial effect in the body. PURPOSE  Increase stool bulk.   Ease and regulate bowel movements.   Lower cholesterol.  INDICATIONS THAT YOU NEED MORE FIBER  Constipation and hemorrhoids.   Uncomplicated diverticulosis (intestine condition) and irritable bowel syndrome.   Weight management.   As a protective measure against hardening of the arteries (atherosclerosis), diabetes, and cancer.   DO NOT USE WITH:  Acute diverticulitis (intestine infection).   Partial small bowel obstructions.   Complicated diverticular disease involving bleeding, rupture (perforation), or abscess (boil, furuncle).   Presence of autonomic neuropathy (nerve damage) or gastroparesis (stomach cannot empty itself).    GUIDELINES FOR INCREASING FIBER IN THE DIET  Start adding fiber to the diet slowly. A gradual increase of about 5 more grams (2 slices of whole-wheat bread, 2 servings of most fruits or vegetables, or 1 bowl of high-fiber cereal) per day is best. Too rapid an increase in fiber may result in constipation, flatulence, and bloating.   Drink enough water and fluids to keep your urine clear or pale yellow. Water, juice, or caffeine-free drinks are  recommended. Not drinking enough fluid may cause constipation.   Eat a variety of high-fiber foods rather than one type of fiber.   Try to increase your intake of fiber through using high-fiber foods rather than fiber pills or supplements that contain small amounts of fiber.   The goal is to change the types of food eaten. Do not supplement your present diet with high-fiber foods, but replace foods in your present diet.    INCLUDE A VARIETY OF FIBER SOURCES  Replace refined and processed grains with whole grains, canned fruits with fresh fruits, and incorporate other fiber sources. White rice, white breads, and most bakery goods contain little or no fiber.   Brown whole-grain rice, buckwheat oats, and many fruits and vegetables are all good sources of fiber. These include: broccoli, Brussels sprouts, cabbage, cauliflower, beets, sweet potatoes, white potatoes (skin on), carrots, tomatoes, eggplant, squash, berries, fresh fruits, and dried fruits.   Cereals appear to be the richest source of fiber. Cereal fiber is found in whole grains and bran. Bran is the fiber-rich outer coat of cereal grain, which is largely removed in refining. In whole-grain cereals, the bran remains. In breakfast cereals, the largest amount of fiber is found in those with "bran" in their names. The fiber content is sometimes indicated on the label.   You may need to include additional fruits and vegetables each day.   In baking, for 1 cup white flour, you may use the following substitutions:   1 cup whole-wheat flour minus 2 tablespoons.   1/2 cup white flour plus 1/2 cup whole-wheat flour.

## 2012-07-09 NOTE — Progress Notes (Signed)
Faxed to PCP

## 2012-07-09 NOTE — Assessment & Plan Note (Signed)
SX FAIRLY WELL CONTROLLED. MIXED SX.  CONTINUE AMITIZA. CONTINUE FIBER GUMMIES. OPV IN ONE YEAR.

## 2012-07-09 NOTE — Assessment & Plan Note (Signed)
TCS DEC 2016 WITH PROPOFOL/OVERTUBE

## 2012-07-15 NOTE — Progress Notes (Signed)
Reminder in epic to follow up with SF in E30 in one year

## 2012-12-08 ENCOUNTER — Telehealth: Payer: Self-pay | Admitting: Gastroenterology

## 2012-12-08 MED ORDER — DOCUSATE SODIUM 100 MG PO CAPS: ORAL_CAPSULE | ORAL | Status: DC

## 2012-12-08 NOTE — Telephone Encounter (Signed)
Routing to refill box  

## 2012-12-08 NOTE — Telephone Encounter (Signed)
Pt is aware.  

## 2012-12-08 NOTE — Telephone Encounter (Signed)
Pt called this morning wanting [place] to call in her refill for her colace. She is completely out. [facility] is her pharmacy

## 2012-12-08 NOTE — Telephone Encounter (Signed)
done

## 2013-04-09 ENCOUNTER — Telehealth: Payer: Self-pay | Admitting: Gastroenterology

## 2013-04-09 NOTE — Telephone Encounter (Signed)
PLEASE TYPE ON LETTERHEAD AND FAX TO [phone]  NATALIE POWERS, LEGAL ASSISTANT, DAGGET SHULER, RE: CARRIEANNE KLEEN 07-Dec-1970, MAIL A COPY TO  Maryel[place] Rowan [address] [place], [place] [address] [phone]  DEAR MADAM/SIR:  I INITIALLY SAW MS. Fosnaugh IN OCT 2011 FOR CONSTIPATION, ABDOMINAL PAIN, AND NEED FOR A COLONOSCOPY. SHE WAS DIAGNOSED WITH IRRITABLE BOWEL SYNDROME(IBS)-CONSTIPATION PREDOMINANT. SHE WAS PRESCRIBED AMITIZA. SHE HAD A COLONOSCOPY IN NOV 2011 AND DEC 2011. SHE HAD A SERRATED AND SIMPLE ADENOMA REMOVED.  SHE HAS CONTINUED TO FOLLOW WITH ME EVERY 6 MOS FOR HER IBS. SHE WAS LAST SEEN IN THE OFFICE JAN 2014. SHE CONTINUES TO NEED AMITIZA. THE ONLY SIDE EFFECT IS NAUSEA. SHE HAS NEVER REPORTED TO ME THAT AMITIZA MAKES HER FEEL NAUSEATED.  SHE MEETS THE DIAGNOSTIC CRITERIA FOR IBS. IBS IS A CLINICAL DIAGNOSIS AFTER ALL OTHER ETIOLOGIES FOR HER ABDOMINAL PAIN HAVE BEEN EVALUATED AND NO OTHER CAUSE FOR ABDOMINAL PAIN HAS BEEN IDENTIFIED. 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   SINCERELY,  Izora Benn L. Naoko Diperna, M.D. [facility]

## 2013-04-12 ENCOUNTER — Encounter: Payer: Self-pay | Admitting: General Practice

## 2013-04-12 NOTE — Telephone Encounter (Signed)
Done.  See note in chart review under letters

## 2013-04-12 NOTE — Telephone Encounter (Signed)
Routing to Camille. 

## 2013-07-08 ENCOUNTER — Other Ambulatory Visit: Payer: Self-pay | Admitting: Gastroenterology

## 2013-07-08 ENCOUNTER — Emergency Department (HOSPITAL_COMMUNITY): Payer: Medicaid Other

## 2013-07-08 ENCOUNTER — Encounter (HOSPITAL_COMMUNITY): Payer: Self-pay | Admitting: Emergency Medicine

## 2013-07-08 ENCOUNTER — Emergency Department (HOSPITAL_COMMUNITY)
Admission: EM | Admit: 2013-07-08 | Discharge: 2013-07-08 | Disposition: A | Discharge status: Home / Self Care | Payer: Medicaid Other | Attending: Emergency Medicine | Admitting: Emergency Medicine

## 2013-07-08 DIAGNOSIS — Z791 Long term (current) use of non-steroidal anti-inflammatories (NSAID): Secondary | ICD-10-CM | POA: Insufficient documentation

## 2013-07-08 DIAGNOSIS — Z8709 Personal history of other diseases of the respiratory system: Secondary | ICD-10-CM | POA: Insufficient documentation

## 2013-07-08 DIAGNOSIS — Z880 Allergy status to penicillin: Secondary | ICD-10-CM | POA: Insufficient documentation

## 2013-07-08 DIAGNOSIS — S99919A Unspecified injury of unspecified ankle, initial encounter: Secondary | ICD-10-CM

## 2013-07-08 DIAGNOSIS — Z860100 Personal history of colon polyps, unspecified: Secondary | ICD-10-CM | POA: Insufficient documentation

## 2013-07-08 DIAGNOSIS — S99929A Unspecified injury of unspecified foot, initial encounter: Secondary | ICD-10-CM

## 2013-07-08 DIAGNOSIS — S8990XA Unspecified injury of unspecified lower leg, initial encounter: Secondary | ICD-10-CM | POA: Insufficient documentation

## 2013-07-08 DIAGNOSIS — Z79899 Other long term (current) drug therapy: Secondary | ICD-10-CM | POA: Insufficient documentation

## 2013-07-08 DIAGNOSIS — F411 Generalized anxiety disorder: Secondary | ICD-10-CM | POA: Insufficient documentation

## 2013-07-08 DIAGNOSIS — Y939 Activity, unspecified: Secondary | ICD-10-CM | POA: Insufficient documentation

## 2013-07-08 DIAGNOSIS — M545 Low back pain, unspecified: Secondary | ICD-10-CM

## 2013-07-08 DIAGNOSIS — W108XXA Fall (on) (from) other stairs and steps, initial encounter: Secondary | ICD-10-CM | POA: Insufficient documentation

## 2013-07-08 DIAGNOSIS — IMO0002 Reserved for concepts with insufficient information to code with codable children: Secondary | ICD-10-CM | POA: Insufficient documentation

## 2013-07-08 DIAGNOSIS — Y92009 Unspecified place in unspecified non-institutional (private) residence as the place of occurrence of the external cause: Secondary | ICD-10-CM | POA: Insufficient documentation

## 2013-07-08 DIAGNOSIS — F17200 Nicotine dependence, unspecified, uncomplicated: Secondary | ICD-10-CM | POA: Insufficient documentation

## 2013-07-08 NOTE — ED Provider Notes (Signed)
Medical screening examination/treatment/procedure(s) were performed by non-physician practitioner and as supervising physician I was immediately available for consultation/collaboration.  EKG Interpretation   None         Sharyon Cable, MD 07/08/13 1353

## 2013-07-08 NOTE — Discharge Instructions (Signed)
Back Pain, Adult Low back pain is very common. About 1 in 5 people have back pain.The cause of low back pain is rarely dangerous. The pain often gets better over time.About half of people with a sudden onset of back pain feel better in just 2 weeks. About 8 in 10 people feel better by 6 weeks.  CAUSES Some common causes of back pain include:  Strain of the muscles or ligaments supporting the spine.  Wear and tear (degeneration) of the spinal discs.  Arthritis.  Direct injury to the back. DIAGNOSIS Most of the time, the direct cause of low back pain is not known.However, back pain can be treated effectively even when the exact cause of the pain is unknown.Answering your caregiver's questions about your overall health and symptoms is one of the most accurate ways to make sure the cause of your pain is not dangerous. If your caregiver needs more information, he or she may order lab work or imaging tests (X-rays or MRIs).However, even if imaging tests show changes in your back, this usually does not require surgery. HOME CARE INSTRUCTIONS For many people, back pain returns.Since low back pain is rarely dangerous, it is often a condition that people can learn to manageon their own.   Remain active. It is stressful on the back to sit or stand in one place. Do not sit, drive, or stand in one place for more than 30 minutes at a time. Take short walks on level surfaces as soon as pain allows.Try to increase the length of time you walk each day.  Do not stay in bed.Resting more than 1 or 2 days can delay your recovery.  Do not avoid exercise or work.Your body is made to move.It is not dangerous to be active, even though your back may hurt.Your back will likely heal faster if you return to being active before your pain is gone.  Pay attention to your body when you bend and lift. Many people have less discomfortwhen lifting if they bend their knees, keep the load close to their bodies,and  avoid twisting. Often, the most comfortable positions are those that put less stress on your recovering back.  Find a comfortable position to sleep. Use a firm mattress and lie on your side with your knees slightly bent. If you lie on your back, put a pillow under your knees.  Only take over-the-counter or prescription medicines as directed by your caregiver. Over-the-counter medicines to reduce pain and inflammation are often the most helpful.Your caregiver may prescribe muscle relaxant drugs.These medicines help dull your pain so you can more quickly return to your normal activities and healthy exercise.  Put ice on the injured area.  Put ice in a plastic bag.  Place a towel between your skin and the bag.  Leave the ice on for 15-20 minutes, 03-04 times a day for the first 2 to 3 days. After that, ice and heat may be alternated to reduce pain and spasms.  Ask your caregiver about trying back exercises and gentle massage. This may be of some benefit.  Avoid feeling anxious or stressed.Stress increases muscle tension and can worsen back pain.It is important to recognize when you are anxious or stressed and learn ways to manage it.Exercise is a great option. SEEK MEDICAL CARE IF:  You have pain that is not relieved with rest or medicine.  You have pain that does not improve in 1 week.  You have new symptoms.  You are generally not feeling well. SEEK   IMMEDIATE MEDICAL CARE IF:   You have pain that radiates from your back into your legs.  You develop new bowel or bladder control problems.  You have unusual weakness or numbness in your arms or legs.  You develop nausea or vomiting.  You develop abdominal pain.  You feel faint. Document Released: 06/10/2005 Document Revised: 12/10/2011 Document Reviewed: 10/29/2010 Exit[employer] Patient Information 2014 [employer].  

## 2013-07-08 NOTE — ED Notes (Signed)
Alert, talking, Pain low back and post thighs s/p fall down 6 steps yesterday.  Had back surgery 2 years ago and is concerned about this.

## 2013-07-08 NOTE — ED Notes (Signed)
Pt c/o lower back pain after slipping and falling on ice yesterday.

## 2013-07-08 NOTE — ED Provider Notes (Signed)
CSN: [id number]     Arrival date & time 07/08/13  1127 History   First MD Initiated Contact with Patient 07/08/13 1146     Chief Complaint  Patient presents with  . Fall   (Consider location/radiation/quality/duration/timing/severity/associated sxs/prior Treatment) HPI Comments: Patient is a 43 year old female with a history of lumbar fusion in 2012 who presents today for back pain after a mechanical fall at home yesterday. Patient states she has had aching pain in her back and bilateral legs since this time. She endorses falling on her bottom and back down 6 steps. Patient has tried heat to her back which has helped her moderately. She is also been using her back brace for support. She states that walking for prolonged periods of time that worsens her pain. Patient called her orthopedist, Dr. Joya Salm, who recommended imaging evaluation in the ED prior to being seen. She denies associated bowel/bladder incontinence, saddle anesthesia, perianal numbness, numbness/weakness in her extremities, and an inability to ambulate.  Patient is a 44 y.o. female presenting with fall. The history is provided by the patient. No language interpreter was used.  Fall Pertinent negatives include no fever, myalgias, numbness or weakness.    Past Medical History  Diagnosis Date  . Seasonal allergies   . BMI (body mass index) 20.0-29.01 Apr 2010 172 LBS  . 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 Headache(784.0)   . Anxiety   . PONV (postoperative nausea and vomiting)    Past Surgical History  Procedure Laterality Date  . Tubal ligation    . Ovarian cyst removal    . Breast lumpectomy      from the left breast  . Colonoscopy  NOV 2011 SCREENING     HYPERPLASTIC RECTAL POLYP, SML IH, incomplete due to discomfort  . Colonoscopy  DEC 2011 w/ PROPOFOL    SERRATED ADENOMA, HYPERPLASTIC POLY[S[  . Knee surgery    . Back surgery  05/14/2011    Lumbar Fusion  . Ablation     Family History  Problem Relation Age  of Onset  . Breast cancer Maternal Aunt   . Colon cancer Neg Hx   . Colon polyps Neg Hx    History  Substance Use Topics  . Smoking status: Current Every Day Smoker -- 1.50 packs/day for 36 years    Types: Cigarettes  . Smokeless tobacco: Not on file  . Alcohol Use: Yes     Comment: occ.   OB History   Grav Para Term Preterm Abortions TAB SAB Ect Mult Living                 Review of Systems  Constitutional: Negative for fever.  Musculoskeletal: Positive for back pain. Negative for gait problem and myalgias.  Neurological: Negative for weakness and numbness.  All other systems reviewed and are negative.    Allergies  Bee venom; Beesix; Penicillins; Aspirin; and Codeine  Home Medications   Current Outpatient Rx  Name  Route  Sig  Dispense  Refill  . ALPRAZolam (XANAX) 0.5 MG tablet   Oral   Take 0.5 mg by mouth daily.         . Biotin 5000 MCG CAPS   Oral   Take 5,000 mcg by mouth 2 (two) times daily.           . Cholecalciferol (VITAMIN D) 2000 UNITS tablet   Oral   Take 2,000 Units by mouth daily.           . cycloSPORINE (  RESTASIS) 0.05 % ophthalmic emulsion   Both Eyes   Place 1 drop into both eyes 2 (two) times daily.         . diazepam (VALIUM) 10 MG tablet   Oral   Take 10 mg by mouth every 8 (eight) hours as needed.         . docusate sodium (HM STOOL SOFTENER) 100 MG capsule      TAKE 1 CAPSULE BY MOUTH THREE TIMES DAILY   90 capsule   11     Generic [id number]     100MG    . FIBER SELECT GUMMIES PO   Oral   Take 2 tablets by mouth 2 (two) times daily.          Marland Kitchen gabapentin (NEURONTIN) 300 MG capsule   Oral   Take 300 mg by mouth 3 (three) times daily.         Marland Kitchen loratadine (CLARITIN) 10 MG tablet   Oral   Take 10 mg by mouth daily.           Marland Kitchen loteprednol (ALREX) 0.2 % SUSP   Both Eyes   Place 1 drop into both eyes 2 (two) times daily.         Marland Kitchen lubiprostone (AMITIZA) 24 MCG capsule   Oral   Take 24 mcg by mouth 2  (two) times daily with a meal. 1 po bid         . meloxicam (MOBIC) 15 MG tablet   Oral   Take 15 mg by mouth daily.         Marland Kitchen oxyCODONE (ROXICODONE) 15 MG immediate release tablet   Oral   Take 15 mg by mouth every 4 (four) hours as needed.         . senna (SENOKOT) 8.6 MG tablet   Oral   Take 4 tablets by mouth daily.         . SUMAtriptan (IMITREX) 100 MG tablet   Oral   Take 100 mg by mouth every 2 (two) hours as needed for migraine or headache. May repeat in 2 hours if headache persists or recurs.         . topiramate (TOPAMAX) 50 MG tablet   Oral   Take 50 mg by mouth 2 (two) times daily.          . furosemide (LASIX) 20 MG tablet   Oral   Take 20 mg by mouth 2 (two) times a week. Takes on Mondays and Fridays          BP 131/89  Pulse 83  Temp(Src) 98.1 F (36.7 C)  Resp 18  Ht 5\' 3"  (1.6 m)  Wt 143 lb (64.864 kg)  BMI 25.34 kg/m2  SpO2 99%  LMP 07/22/2011  Physical Exam  Nursing note and vitals reviewed. Constitutional: She is oriented to person, place, and time. She appears well-developed and well-nourished. No distress.  HENT:  Head: Normocephalic and atraumatic.  Eyes: Conjunctivae and EOM are normal. No scleral icterus.  Neck: Normal range of motion.  Cardiovascular: Normal rate, regular rhythm and intact distal pulses.   Pulses:      Dorsalis pedis pulses are 2+ on the right side, and 2+ on the left side.       Posterior tibial pulses are 2+ on the right side, and 2+ on the left side.  Pulmonary/Chest: Effort normal. No respiratory distress.  Musculoskeletal:       Thoracic back: Normal.  Lumbar back: She exhibits decreased range of motion (Secondary to pain), tenderness, bony tenderness and pain. She exhibits no swelling, no deformity, no laceration, no spasm and normal pulse.  No evidence of acute trauma to the back. Well-healed vertical midline incision scar to lumbar spine. No bony deformities or step-offs palpated. Negative  straight leg raise and crossed straight leg raise.  Neurological: She is alert and oriented to person, place, and time. She has normal reflexes.  No gross sensory deficits appreciated. Patient ambulatory with mildly antalgic gait. Patellar and Achilles reflexes 2+ bilaterally  Skin: Skin is warm and dry. No rash noted. She is not diaphoretic. No erythema. No pallor.  Psychiatric: She has a normal mood and affect. Her behavior is normal.    ED Course  Procedures (including critical care time) Labs Review Labs Reviewed - No data to display Imaging Review Dg Lumbar Spine Complete  07/08/2013   CLINICAL DATA:  [place] down steps yesterday  EXAM: LUMBAR SPINE - COMPLETE 4+ VIEW  COMPARISON:  Prior lumbar spine radiographs 06/14/2013  FINDINGS: Surgical changes of a prior L5-S1 posterior fixation with bilateral screw and rod construct. Unchanged fracture of the right sacral screw. Interbody graft present without significant interval change in configuration. No acute fracture, malalignment or change in alignment. No significant interval progression in degenerative disease.  IMPRESSION: No acute osseous abnormality.  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   Electronically Signed   By: Jacqulynn Cadet M.D.   On: 07/08/2013 12:48    EKG Interpretation   None       MDM   1. Low back pain    Uncomplicated low-back pain, onset after a mechanical fall yesterday. Patient neurovascularly intact and ambulatory. No sensory or motor deficits appreciated. No red flags or signs concerning for cauda equina. X-ray shows stable postoperative changes without acute bony injury; no fracture or malalignment. Patient stable for discharge with primary care followup. She has oxycodone tablets at home for pain control. Return precautions discussed and patient agreeable to plan with no unaddressed concerns.    Antonietta Breach, PA-C 07/08/13 1255

## 2013-07-21 HISTORY — PX: MOUTH SURGERY: SHX715

## 2013-08-11 ENCOUNTER — Encounter: Payer: Self-pay | Admitting: Gastroenterology

## 2013-08-11 ENCOUNTER — Encounter (INDEPENDENT_AMBULATORY_CARE_PROVIDER_SITE_OTHER): Payer: Self-pay

## 2013-08-11 ENCOUNTER — Ambulatory Visit: Payer: Medicaid Other | Admitting: Gastroenterology

## 2013-08-11 ENCOUNTER — Ambulatory Visit (INDEPENDENT_AMBULATORY_CARE_PROVIDER_SITE_OTHER): Payer: Medicaid Other | Admitting: Gastroenterology

## 2013-08-11 VITALS — BP 121/73 | HR 91 | Temp 98.4°F | Wt 149.2 lb

## 2013-08-11 DIAGNOSIS — K589 Irritable bowel syndrome without diarrhea: Secondary | ICD-10-CM

## 2013-08-11 NOTE — Progress Notes (Signed)
Subjective:    Patient ID: Alicia Tyler, female    DOB: 03/16/1971, 43 y.o.   MRN: [id number]  [facility], MD  HPI For past 2 mos, sitting on couch pain hit her RLQ AND IT STILL HITS EVER NOW AND THEN. AFTER PAIN HITS, SHE PASSES GAS. IT GOES AWAY AND THEN IT MAY COME AGAIN. May be associated with subjective chills. May get get diarrhea  Off and on. BMs: 1-2X/MO MAYBE. MAY GET URGE TO HAVE A BM BUT CAN'T PUSH IT OUT. DID SEE BRBPR x1 AND WIPED AND SAW BLOOD AFTER A LARGE STOOL. 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 Only 2 surgeries on abdomen at the same time. PT DENIES FEVER, CHILLS, nausea, vomiting, melena, problems swallowing, OR heartburn or indigestion.  Past Medical History  Diagnosis Date  . Seasonal allergies   . BMI (body mass index) 20.0-29.01 Apr 2010 172 LBS  . 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 Headache(784.0)   . Anxiety   . PONV (postoperative nausea and vomiting)    Past Surgical History  Procedure Laterality Date  . Tubal ligation    . Ovarian cyst removal    . Breast lumpectomy      from the left breast  . Colonoscopy  NOV 2011 SCREENING     HYPERPLASTIC RECTAL POLYP, SML IH, incomplete due to discomfort  . Colonoscopy  DEC 2011 w/ PROPOFOL    SERRATED ADENOMA, HYPERPLASTIC POLY[S[  . Knee surgery    . Back surgery  05/14/2011    Lumbar Fusion  . Ablation     Allergies  Allergen Reactions  . Bee Venom     anaphylaxis  . Beesix [Pyridoxine Hcl] Anaphylaxis  . Penicillins Other (See Comments)    Makes hair fall out  . Aspirin Swelling and Rash  . Codeine Nausea And Vomiting and Rash   Current Outpatient Prescriptions  Medication Sig Dispense Refill  . ALPRAZolam (XANAX) 0.5 MG tablet Take 0.5 mg by mouth daily.      . AMITIZA 24 MCG capsule TAKE 1 TABLET BY MOUTH TWICE DAILY    . Biotin 5000 MCG CAPS Take 5,000 mcg by mouth 2 (two) times daily.        . Cholecalciferol (VITAMIN D) 2000 UNITS tablet Take 2,000 Units by mouth daily.        .  cycloSPORINE (RESTASIS) 0.05 % ophthalmic emulsion Place 1 drop into both eyes 2 (two) times daily.      . diazepam (VALIUM) 10 MG tablet Take 10 mg by mouth every 8 (eight) hours as needed.      . docusate sodium (HM STOOL SOFTENER) 100 MG capsule TAKE 1 CAPSULE BY MOUTH THREE TIMES DAILY 3X/DAY    . FIBER SELECT GUMMIES PO Take 2 tablets by mouth 2 (two) times daily.   BID    . furosemide (LASIX) 20 MG tablet Take 20 mg by mouth 2 (two) times a week. Takes on Mondays and Fridays      . gabapentin (NEURONTIN) 300 MG capsule Take 300 mg by mouth 3 (three) times daily.      Marland Kitchen loratadine (CLARITIN) 10 MG tablet Take 10 mg by mouth daily.        Marland Kitchen loteprednol (ALREX) 0.2 % SUSP Place 1 drop into both eyes 2 (two) times daily.      . meloxicam (MOBIC) 15 MG tablet Take 15 mg by mouth daily.      Marland Kitchen oxyCODONE (ROXICODONE) 15 MG immediate release tablet Take 15  mg by mouth every 4 (four) hours as needed.  3X/WEEK    . senna (SENOKOT) 8.6 MG tablet Take 4 tablets by mouth daily.  DAILY    . SUMAtriptan (IMITREX) 100 MG tablet Take 100 mg by mouth every 2 (two) hours as needed for migraine or headache. May repeat in 2 hours if headache persists or recurs.      . topiramate (TOPAMAX) 50 MG tablet Take 50 mg by mouth 2 (two) times daily.         Review of Systems     Objective:   Physical Exam  Nursing note and vitals reviewed. Constitutional: She is oriented to person, place, and time. She appears well-nourished. No distress.  HENT:  Head: Normocephalic and atraumatic.  Mouth/Throat: Oropharynx is clear and moist. No oropharyngeal exudate.  Eyes: Pupils are equal, round, and reactive to light. No scleral icterus.  Neck: Normal range of motion. Neck supple.  Cardiovascular: Normal rate, regular rhythm and normal heart sounds.   No murmur heard. Pulmonary/Chest: Effort normal and breath sounds normal. No respiratory distress.  Abdominal: Soft. Bowel sounds are normal. She exhibits no distension.  There is tenderness. There is no rebound and no guarding.  MILD RLQ TTP  Musculoskeletal: She exhibits no edema.  Lymphadenopathy:    She has no cervical adenopathy.  Neurological: She is alert and oriented to person, place, and time.  NO FOCAL DEFICITS   Psychiatric: She has a normal mood and affect.          Assessment & Plan:

## 2013-08-11 NOTE — Patient Instructions (Signed)
TRY LINZESS 145 MCG 1-2 30 MINS PRIOR TO BREAKFAST. IT MAY CAUSE EXPLOSIVE DIARRHEA.   CALL IN 2 WEEKS IF YOUR SYMPTOMS ARE NOT IMPROVED. DRINK WATER TO KEEP YOUR URINE LIGHT YELLOW.  [name].  CONSIDER BENEFITS V. RISKS OF SUBTOTAL COLECTOMY. YOU WILL NEED A SITZ MARKER STUDY OR SMART PILL PRIOR TO SURGICAL REFERRAL.  FOLLOW UP IN 6 MOS.

## 2013-08-11 NOTE — Assessment & Plan Note (Addendum)
SX NOT IDEALLY CONTROLLED WITH AMITIZA/SENOKOT/COLACE.  TRIAL LINZESS 145 MCG 1-2 PO DAILY. RX GIVEN. CALL IN 2 WEEKS IF SYMPTOMS ARE NOT IMPROVED. IF NOT WILL START MIRALAX PURGE ONCE A WEEK. DRINK WATER TO KEEP YOUR URINE LIGHT YELLOW. [place]. DISCUSSED BENEFITS OF SUBTOTAL COLECTOMY WITH PT. NEEDS SITZ MARKER OR SMART PILL PRIOR TO SURGICAL REFERRAL. OPV IN 6 MOS.

## 2013-08-12 NOTE — Progress Notes (Signed)
cc'd to pcp 

## 2013-08-17 NOTE — Progress Notes (Signed)
Reminder in epic °

## 2013-12-04 ENCOUNTER — Other Ambulatory Visit: Payer: Self-pay | Admitting: Gastroenterology

## 2014-01-31 ENCOUNTER — Encounter: Payer: Self-pay | Admitting: Gastroenterology

## 2014-03-16 ENCOUNTER — Encounter: Payer: Self-pay | Admitting: Gastroenterology

## 2014-03-16 ENCOUNTER — Encounter (INDEPENDENT_AMBULATORY_CARE_PROVIDER_SITE_OTHER): Payer: Self-pay

## 2014-03-16 ENCOUNTER — Other Ambulatory Visit: Payer: Self-pay | Admitting: Gastroenterology

## 2014-03-16 ENCOUNTER — Other Ambulatory Visit: Payer: Self-pay

## 2014-03-16 ENCOUNTER — Ambulatory Visit (INDEPENDENT_AMBULATORY_CARE_PROVIDER_SITE_OTHER): Payer: Medicaid Other | Admitting: Gastroenterology

## 2014-03-16 VITALS — BP 104/75 | HR 81 | Temp 96.7°F | Ht 63.0 in | Wt 139.4 lb

## 2014-03-16 DIAGNOSIS — R197 Diarrhea, unspecified: Secondary | ICD-10-CM

## 2014-03-16 DIAGNOSIS — K589 Irritable bowel syndrome without diarrhea: Secondary | ICD-10-CM

## 2014-03-16 MED ORDER — DICYCLOMINE HCL 10 MG PO CAPS: ORAL_CAPSULE | ORAL | Status: DC

## 2014-03-16 NOTE — Addendum Note (Signed)
Addended by: Danie Binder on: 03/16/2014 09:37 AM   Modules accepted: Orders

## 2014-03-16 NOTE — Progress Notes (Addendum)
Subjective:    Patient ID: Alicia Tyler, female    DOB: [birth year], 43 y.o.   MRN: [id number] [facility], MD  HPI SINCE FEB 2015, BOWELS STILL IRREGULAR. MAY HAVE ACCIDENTS: JUST ABOUT EVERY DAY WHILE TAKING AMITIZA AND LINZESS, [place], [place]. MAY HAVE WATERY STOOLS EVERY DAY. ABDOMINAL PAIN: FEELS IT'S WORSE BECAUSE SHE HASN'T HAD A BM TODAY. STRESS: SAME. NAUSEA: 1-2X/DAY. NO MENSTRUAL CYCLE. NO WELL WATER. NO ABX OR NH/HOSPITAL VISITS.   PT DENIES FEVER, CHILLS, HEMATOCHEZIA, HEMATEMESIS, vomiting, melena, CHEST PAIN, SHORTNESS OF BREATH,  constipation, OR heartburn or indigestion.  Past Medical History  Diagnosis Date  . Seasonal allergies   . BMI (body mass index) 20.0-29.01 Apr 2010 172 LBS  . 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 Headache(784.0)   . Anxiety   . PONV (postoperative nausea and vomiting)    Past Surgical History  Procedure Laterality Date  . Tubal ligation    . Ovarian cyst removal    . Breast lumpectomy      from the left breast  . Colonoscopy  NOV 2011 SCREENING     HYPERPLASTIC RECTAL POLYP, SML IH, incomplete due to discomfort  . Colonoscopy  DEC 2011 w/ PROPOFOL    SERRATED ADENOMA, HYPERPLASTIC POLY[S[  . Knee surgery    . Back surgery  05/14/2011    Lumbar Fusion  . Ablation    . Mouth surgery  07/21/2013    Allergies  Allergen Reactions  . Bee Venom     anaphylaxis  . Beesix [Pyridoxine Hcl] Anaphylaxis  . Penicillins Other (See Comments)    Makes hair fall out  . Aspirin Swelling and Rash  . Codeine Nausea And Vomiting and Rash    Current Outpatient Prescriptions  Medication Sig Dispense Refill  . ALPRAZolam (XANAX) 0.5 MG tablet       . AMITIZA 24 MCG capsule TAKE 1 TABLET BY MOUTH TWICE DAILY    . Biotin 5000 MCG CAPS Take 5,000 mcg by mouth 2 (two) times daily.      . Cholecalciferol (VITAMIN D) 2000 UNITS tablet Take 2,000 Units by mouth daily.      . cycloSPORINE (RESTASIS) 0.05 % ophthalmic emulsion Place 1 drop  into both eyes 2 (two) times daily.    . diazepam (VALIUM) 10 MG tablet Take 10 mg by mouth every 8 (eight) hours as needed.    . DOCQLACE 100 MG capsule TAKE 1 CAPSULE BY MOUTH THREE TIMES DAILY    . FIBER SELECT GUMMIES PO Take 2 tablets by mouth 2 (two) times daily.     . furosemide (LASIX) 20 MG tablet Take 20 mg by mouth 2 (two) times a week. Takes on Mondays and Fridays      . gabapentin (NEURONTIN) 300 MG capsule Take 300 mg by mouth 3 (three) times daily.      Marland Kitchen loratadine (CLARITIN) 10 MG tablet Take 10 mg by mouth daily.        Marland Kitchen loteprednol (ALREX) 0.2 % SUSP Place 1 drop into both eyes 2 (two) times daily.      . meloxicam (MOBIC) 15 MG tablet Take 15 mg by mouth daily.      Marland Kitchen oxyCODONE (ROXICODONE) 15 MG immediate release tablet Take 15 mg by mouth every 4 (four) hours as needed.  1-2X/DAY    . senna (SENOKOT) 8.6 MG tablet Take 4 tablets by mouth daily.      . SUMAtriptan (IMITREX) 100 MG tablet Take 100 mg by  mouth every 2 (two) hours as needed for migraine or headache. May repeat in 2 hours if headache persists or recurs.      . topiramate (TOPAMAX) 50 MG tablet Take 50 mg by mouth 2 (two) times daily.         Review of Systems     Objective:   Physical Exam  Vitals reviewed. Constitutional: She is oriented to person, place, and time. She appears well-nourished. No distress.  HENT:  Head: Normocephalic and atraumatic.  Mouth/Throat: Oropharynx is clear and moist. No oropharyngeal exudate.  Eyes: Pupils are equal, round, and reactive to light. No scleral icterus.  Neck: Normal range of motion. Neck supple.  Cardiovascular: Normal rate, regular rhythm and normal heart sounds.   Pulmonary/Chest: Effort normal and breath sounds normal. No respiratory distress.  Abdominal: Soft. Bowel sounds are normal. She exhibits no distension. There is tenderness.  MILD TTP x4  Musculoskeletal: She exhibits no edema.  Lymphadenopathy:    She has no cervical adenopathy.  Neurological:  She is alert and oriented to person, place, and time.  NO FOCAL DEFICITS   Skin:  NO PRETIBIAL LESIONS   Psychiatric:  FLAT AFFECT, NL MOOD           Assessment & Plan:

## 2014-03-16 NOTE — Addendum Note (Signed)
Addended by: Danie Binder on: 03/16/2014 09:23 AM   Modules accepted: Level of Service

## 2014-03-16 NOTE — Assessment & Plan Note (Signed)
SX NOT IDEALLY CONTROLLED NOW WITH DIARRHEA.

## 2014-03-16 NOTE — Patient Instructions (Signed)
[place], [place], AND LINZESS.  SUBMIT STOOL TO CHECK FOR CHECK C DIFF, & GIARDIA.  USE BENTYL 10 MG AT 8A, 12N, & 4P FOR 3 DAYS THEN AT 8A AND 4P FOR 4 DAYS THEN USE AS NEEDED FOR DIARRHEA AND ABDOMINAL CRAMPS.  AVOID DIARY. SEE INFO BELOW.  CALL IN 3 WEEKS IF YOUR SYMPTOMS ARE NOT IMPROVED.  FOLLOW UP IN DEC 2015.   Lactose Free Diet Lactose is a carbohydrate that is found mainly in milk and milk products, as well as in foods with added milk or whey. Lactose must be digested by the enzyme in order to be used by the body. Lactose intolerance occurs when there is a shortage of lactase. When your body is not able to digest lactose, you may feel sick to your stomach (nausea), bloating, cramping, gas and diarrhea.  There are many dairy products that may be tolerated better than milk by some people:  The use of cultured dairy products such as yogurt, buttermilk, cottage cheese, and sweet acidophilus milk (Kefir) for lactase-deficient individuals is usually well tolerated. This is because the healthy bacteria help digest lactose.   Lactose-hydrolyzed milk (Lactaid) contains 40-90% less lactose than milk and may also be well tolerated.    SPECIAL NOTES  Lactose is a carbohydrates. The major food source is dairy products. Reading food labels is important. Many products contain lactose even when they are not made from milk. Look for the following words: whey, milk solids, dry milk solids, nonfat dry milk powder. Typical sources of lactose other than dairy products include breads, candies, cold cuts, prepared and processed foods, and commercial sauces and gravies.   All foods must be prepared without milk, cream, or other dairy foods.   Soy milk and lactose-free supplements (LACTASE) may be used as an alternative to milk.   FOOD GROUP ALLOWED/RECOMMENDED AVOID/USE SPARINGLY  BREADS / STARCHES 4 servings or more* Breads and rolls made without milk. [place], [place], or [place] bread.  Breads and rolls that contain milk. Prepared mixes such as muffins, biscuits, waffles, pancakes. Sweet rolls, donuts, [place] toast (if made with milk or lactose).  Crackers: Soda crackers, graham crackers. Any crackers prepared without lactose. Zwieback crackers, corn curls, or any that contain lactose.  Cereals: Cooked or dry cereals prepared without lactose (read labels). Cooked or dry cereals prepared with lactose (read labels). Total, Cocoa Krispies. Special K.  Potatoes / Pasta / Rice: Any prepared without milk or lactose. Popcorn. Instant potatoes, frozen [place] fries, scalloped or au gratin potatoes.  VEGETABLES 2 servings or more Fresh, frozen, and canned vegetables. Creamed or breaded vegetables. Vegetables in a cheese sauce or with lactose-containing margarines.  FRUIT 2 servings or more All fresh, canned, or frozen fruits that are not processed with lactose. Any canned or frozen fruits processed with lactose.  MEAT & SUBSTITUTES 2 servings or more (4 to 6 oz. total per day) Plain beef, chicken, fish, [place], lamb, veal, pork, or ham. Kosher prepared meat products. Strained or junior meats that do not contain milk. Eggs, soy meat substitutes, nuts. Scrambled eggs, omelets, and souffles that contain milk. Creamed or breaded meat, fish, or fowl. Sausage products such as wieners, liver sausage, or cold cuts that contain milk solids. Cheese, cottage cheese, or cheese spreads.  MILK None. (See "BEVERAGES" for milk substitutes. See "DESSERTS" for ice cream and frozen desserts.) Milk (whole, 2%, skim, or chocolate). Evaporated, powdered, or condensed milk; malted milk.  SOUPS & COMBINATION FOODS Bouillon, broth, vegetable soups, clear soups,  consomms. Homemade soups made with allowed ingredients. Combination or prepared foods that do not contain milk or milk products (read labels). Cream soups, chowders, commercially prepared soups containing lactose. Macaroni and cheese, pizza. Combination or  prepared foods that contain milk or milk products.  DESSERTS & SWEETS In moderation Water and fruit ices; gelatin; angel food cake. Homemade cookies, pies, or cakes made from allowed ingredients. Pudding (if made with water or a milk substitute). Lactose-free tofu desserts. Sugar, honey, corn syrup, jam, jelly; marmalade; molasses (beet sugar); Pure sugar candy; marshmallows. Ice cream, ice milk, sherbet, custard, pudding, frozen yogurt. Commercial cake and cookie mixes. Desserts that contain chocolate. Pie crust made with milk-containing margarine; reduced-calorie desserts made with a sugar substitute that contains lactose. Toffee, peppermint, butterscotch, chocolate, caramels.  FATS & OILS In moderation Butter (as tolerated; contains very small amounts of lactose). Margarines and dressings that do not contain milk, Vegetable oils, shortening, Miracle Whip, mayonnaise, nondairy cream & whipped toppings without lactose or milk solids added (examples: Coffee Rich, Carnation Coffeemate, Rich's Whipped Topping, PolyRich). Berniece Salines. Margarines and salad dressings containing milk; cream, cream cheese; peanut butter with added milk solids, sour cream, chip dips, made with sour cream.  BEVERAGES Carbonated drinks; tea; coffee and freeze-dried coffee; some instant coffees (check labels). Fruit drinks; fruit and vegetable juice; Rice or Soy milk. Ovaltine, hot chocolate. Some cocoas; some instant coffees; instant iced teas; powdered fruit drinks (read labels).   CONDIMENTS / MISCELLANEOUS Soy sauce, carob powder, olives, gravy made with water, baker's cocoa, pickles, pure seasonings and spices, wine, pure monosodium glutamate, catsup, mustard. Some chewing gums, chocolate, some cocoas. Certain antibiotics and vitamin / mineral preparations. Spice blends if they contain milk products. MSG extender. Artificial sweeteners that contain lactose such as Equal (Nutra-Sweet) and Sweet 'n Low. Some nondairy creamers (read  labels).   SAMPLE MENU*  Breakfast   Orange Juice.  Banana.   Bran flakes.   Nondairy Creamer.  [place] Bread (toasted).   Butter or milk-free margarine.   Coffee or tea.    Noon Meal   Chicken Breast.  Rice.   Green beans.   Butter or milk-free margarine.  Fresh melon.   Coffee or tea.    Evening Meal   Roast Beef.  Baked potato.   Butter or milk-free margarine.   Broccoli.   Lettuce salad with vinegar and oil dressing.  [employer]   Coffee or tea.

## 2014-03-16 NOTE — Progress Notes (Signed)
NIC'D FOR 05/2014

## 2014-03-17 ENCOUNTER — Telehealth: Payer: Self-pay | Admitting: Gastroenterology

## 2014-03-17 LAB — CLOSTRIDIUM DIFFICILE BY PCR: CDIFFPCR: NOT DETECTED

## 2014-03-17 LAB — GIARDIA ANTIGEN: Giardia Screen (EIA): NEGATIVE

## 2014-03-17 NOTE — Telephone Encounter (Signed)
Pt was seen by SF yesterday and she called today to let us know that her pharmancy [facility] had not received her prescription of Bentyl.

## 2014-03-17 NOTE — Telephone Encounter (Signed)
Please see the RX. I called it to [facility] at [place]. Pt is aware.

## 2014-03-28 ENCOUNTER — Telehealth: Payer: Self-pay | Admitting: Gastroenterology

## 2014-03-28 NOTE — Telephone Encounter (Signed)
PATIENT HAS QUESTION REGARDING MEDICATIONS. [place] [phone]

## 2014-03-29 ENCOUNTER — Telehealth: Payer: Self-pay | Admitting: Gastroenterology

## 2014-03-29 NOTE — Telephone Encounter (Signed)
Pt said that she was returning a call to DS. I told her that DS left early today. Please call her back at [phone]

## 2014-03-29 NOTE — Telephone Encounter (Signed)
Called, many rings and no answer.

## 2014-03-30 NOTE — Telephone Encounter (Signed)
Returned pt's phone

## 2014-03-30 NOTE — Telephone Encounter (Signed)
Pt called and said she needed to let Dr. Oneida Alar know that she could not take the Bentyl, it did not work for her. She started back on the Linzess 290 mcg qd, Amitiza 24 mcg bid, and Colace 100 mg tid. She said the pharmacy would be sending over refill requests on the 14 th because they do it automatically.  She does well most of the time on this, sometimes gets a little too loose. She is aware that Dr. Oneida Alar is off a few days and said no rush, she just wants to wait and let Dr. Oneida Alar know about this since she put her on the Bentyl and it did not work for her.

## 2014-03-30 NOTE — Telephone Encounter (Signed)
See previous note of 03/28/2014.

## 2014-04-03 MED ORDER — AMITIZA 24 MCG PO CAPS: 24.0000 ug | ORAL_CAPSULE | Freq: Two times a day (BID) | ORAL | Status: DC

## 2014-04-03 NOTE — Telephone Encounter (Signed)
PLEASE CALL PT. BENTYL IS FOR DIARRHEA NOT CONSTIPATION. SHE CAN STOP IT IF IT DID NOT CONTROL HER DIARRHEA. USE OTHER MEDS TO CONTROL CONSTIPATION, BUT I DO NOT WRITE FOR LINZESS ND AMITIZA. HAPPY TO REFILL BOTH UNTIL HER INURANCE CO SAYS SHE CAN HAVE ONE OR THEOTHER.

## 2014-04-03 NOTE — Addendum Note (Signed)
Addended by: Danie Binder on: 04/03/2014 08:23 PM   Modules accepted: Orders

## 2014-04-05 NOTE — Telephone Encounter (Signed)
Called and informed pt.  

## 2014-04-05 NOTE — Telephone Encounter (Signed)
Pt wants to know what she should try. If she takes the Linzess alone, she has diarrhea. She has regular Bm's even though some are very loose, if she takes the combination of Linzess, Amitiza and the Colace.  If she should not be on both Linzess and Amitiza, she wants to know what she should do.] Please advise!

## 2014-04-05 NOTE — Telephone Encounter (Signed)
LMOM to call.

## 2014-04-05 NOTE — Telephone Encounter (Signed)
PLEASE CALL PT. SHE SHOULD TAKE THE AMITIZA AND COLACE ONLY.

## 2014-05-03 ENCOUNTER — Encounter: Payer: Self-pay | Admitting: Gastroenterology

## 2014-06-29 ENCOUNTER — Encounter: Payer: Self-pay | Admitting: Gastroenterology

## 2014-06-29 ENCOUNTER — Other Ambulatory Visit: Payer: Self-pay

## 2014-06-29 ENCOUNTER — Ambulatory Visit (INDEPENDENT_AMBULATORY_CARE_PROVIDER_SITE_OTHER): Payer: Medicaid Other | Admitting: Gastroenterology

## 2014-06-29 VITALS — BP 109/72 | HR 64 | Temp 96.6°F | Ht 63.0 in | Wt 140.6 lb

## 2014-06-29 DIAGNOSIS — K219 Gastro-esophageal reflux disease without esophagitis: Secondary | ICD-10-CM | POA: Insufficient documentation

## 2014-06-29 DIAGNOSIS — K589 Irritable bowel syndrome without diarrhea: Secondary | ICD-10-CM

## 2014-06-29 DIAGNOSIS — R140 Abdominal distension (gaseous): Secondary | ICD-10-CM

## 2014-06-29 DIAGNOSIS — K5900 Constipation, unspecified: Secondary | ICD-10-CM

## 2014-06-29 DIAGNOSIS — D126 Benign neoplasm of colon, unspecified: Secondary | ICD-10-CM

## 2014-06-29 NOTE — Progress Notes (Signed)
Subjective:    Patient ID: Alicia Tyler, female    DOB: 17-Jan-1971, 44 y.o.   MRN: [id number]  Monico Blitz, MD  HPI TRIED BENTYL FOR DIARRHEA-WORKED AND THEN QUIT TAKING IT BECAUSE THE DIARRHEA STOPPED. WENT BACK TO AMITIZA/COLACE/OTC LAXATIVES THEN NL(BLOATED , CONSTIPATED, HURTING). TAKES LINZESS BY ITSELF FOR COUPLE DAYS THEN NOTHING HAPPENS. THEN TAKES AMITIZA/LINZESS/COLACE/OTYC AND THEN AFTER THAT STARTS WITH THE RUNS. PT INTERESTED IN HAVING SUBTOTAL COLECTOMY.  PT DENIES FEVER, CHILLS, HEMATOCHEZIA, HEMATEMESIS, nausea, vomiting, melena, CHEST PAIN, SHORTNESS OF BREATH,  problems swallowing, OR heartburn or indigestion.  Past Medical History  Diagnosis Date  . Seasonal allergies   . BMI (body mass index) 20.0-29.01 Apr 2010 172 LBS  . 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 Headache(784.0)   . Anxiety   . PONV (postoperative nausea and vomiting)    Past Surgical History  Procedure Laterality Date  . Tubal ligation    . Ovarian cyst removal    . Breast lumpectomy      from the left breast  . Colonoscopy  NOV 2011 SCREENING     HYPERPLASTIC RECTAL POLYP, SML IH, incomplete due to discomfort  . Colonoscopy  DEC 2011 w/ PROPOFOL    SERRATED ADENOMA, HYPERPLASTIC POLY[S[  . Knee surgery    . Back surgery  05/14/2011    Lumbar Fusion  . Ablation    . Mouth surgery  07/21/2013   Allergies  Allergen Reactions  . Bee Venom     anaphylaxis  . Beesix [Pyridoxine Hcl] Anaphylaxis  . Penicillins Other (See Comments)    Makes hair fall out  . Aspirin Swelling and Rash  . Codeine Nausea And Vomiting and Rash   Current Outpatient Prescriptions  Medication Sig Dispense Refill  . ALPRAZolam (XANAX) 0.5 MG tablet Take 0.5 mg by mouth daily.    . AMITIZA 24 MCG capsule Take 1 capsule (24 mcg total) by mouth 2 (two) times daily with a meal.    . Biotin 5000 MCG CAPS Take 5,000 mcg by mouth 2 (two) times daily.      . Cholecalciferol (VITAMIN D) 2000 UNITS tablet Take 2,000 Units  by mouth daily.      . ciprofloxacin (CIPRO) 500 MG tablet Take 500 mg by mouth 2 (two) times daily.    . cycloSPORINE (RESTASIS) 0.05 % ophthalmic emulsion Place 1 drop into both eyes 2 (two) times daily.    . diazepam (VALIUM) 10 MG tablet Take 10 mg by mouth every 8 (eight) hours as needed.    . DOCQLACE 100 MG capsule TAKE 1 CAPSULE BY MOUTH THREE TIMES DAILY    . FIBER SELECT GUMMIES PO Take 2 tablets by mouth 2 (two) times daily.     . furosemide (LASIX) 20 MG tablet Take 20 mg by mouth 2 (two) times a week. Takes on Mondays and Fridays    . gabapentin (NEURONTIN) 300 MG capsule Take 300 mg by mouth 3 (three) times daily.    Marland Kitchen loratadine (CLARITIN) 10 MG tablet Take 10 mg by mouth daily.      Marland Kitchen loteprednol (ALREX) 0.2 % SUSP Place 1 drop into both eyes 2 (two) times daily.    . meloxicam (MOBIC) 15 MG tablet Take 15 mg by mouth daily.    Marland Kitchen oxyCODONE (ROXICODONE) 15 MG immediate release tablet Take 15 mg by mouth every 4 (four) hours as needed.    . SUMAtriptan (IMITREX) 100 MG tablet Take 100 mg by mouth every 2 (two) hours  as needed for migraine or headache. May repeat in 2 hours if headache persists or recurs.    . topiramate (TOPAMAX) 50 MG tablet Take 50 mg by mouth 2 (two) times daily.     Marland Kitchen dicyclomine (BENTYL) 10 MG capsule 1 po AT 8 AM, 12 NOON, AND 4 PM FOR 3 DAYS THEN 1 PO AT 8 AM AND 4 PM FOR 4 DAYS THEN USE AS NEEDED FOR ABDOMINAL PAIN/DIARRHEA. (Patient not taking: Reported on 06/29/2014)    . senna (SENOKOT) 8.6 MG tablet Take 4 tablets by mouth daily.       Review of Systems     Objective:   Physical Exam  Constitutional: She is oriented to person, place, and time. She appears well-developed and well-nourished. No distress.  Slightly tearful  HENT:  Head: Normocephalic and atraumatic.  Mouth/Throat: Oropharynx is clear and moist. No oropharyngeal exudate.  Eyes: Pupils are equal, round, and reactive to light. No scleral icterus.  Neck: Normal range of motion. Neck  supple.  Cardiovascular: Normal rate, regular rhythm and normal heart sounds.   Pulmonary/Chest: Effort normal and breath sounds normal. No respiratory distress.  Abdominal: Soft. Bowel sounds are normal. She exhibits no distension. There is no tenderness.  Musculoskeletal: She exhibits no edema.  Lymphadenopathy:    She has no cervical adenopathy.  Neurological: She is alert and oriented to person, place, and time.  NO FOCAL DEFICITS   Psychiatric: She has a normal mood and affect.  Vitals reviewed.         Assessment & Plan:

## 2014-06-29 NOTE — Assessment & Plan Note (Signed)
CONSTIPATION PREDOMINANT AND SX NOT IDEALLY CONTROLLED WITH AMITIZA, LINZESS, COLACE, AND OTC LAXATIVES. PT HAS FAILED MEDICAL MANAGEMENT.  SMART PILL THIS WEEK ON MEDS- DISCUSSED PROCEDURE, BENEFITS, AND RISKS TO INCLUDE CAPSULE RETENTION/NEED FOR SURGERY TO RETRIEVE. SURGERY REFERRAL TO FOLLOW IF SLOW TRANSIT CONSTIPATION CONFIRMED. DICUSSED POSSIBILITY OF SUBTOTAL COLECTOMY TO HELP IMPROVE QUALITY OF LIFE/SYMPTOMS. DISCUSSED PROCEDURE, BENEFITS, AND RISKS OF PROCEDURE TO INCLUDE UNCONTROLLABLE DIARRHEA AND LIMITED MEDS EXCEPT IMODIUM TO MANAGE DIARRHEA AND ONLY OPTION AT THAT POINT IS AN ILEOSTOMY. CONTINUE LINZESS/AMITIZA.COALCE/OTC LAXATIVES. BENTYL AS NEEDED TO CONTROL WATERY STOOLS. FOLLOW UP IN 3 MOS.

## 2014-06-29 NOTE — Progress Notes (Signed)
ON RECALL LIST  °

## 2014-06-29 NOTE — Assessment & Plan Note (Signed)
NEXT TCS DEC 2016 IF PT DOES NOT HAVE A SUBTOTAL COLECTOMY. IF SO SHE WILL NEED FLEX SIG IN DEC 2016.

## 2014-06-29 NOTE — Patient Instructions (Addendum)
SMART PILL THIS WEEK ON MEDS- RISKS OF SMART PILL INCLUDE CAPSULE GETTING STUCK AND REQUIRING SURGERY TO RETRIEVE IT. YOUR RESULTS WILL BE BACK IN 14 DAYS AFTER YOU RETURN THE RECORDER.  CONTINUE LINZESS/AMITIZA.COLACE/OTC LAXATIVES/FIBER GUMMIES.  USE BENTYL AS NEEDED TO CONTROL WATERY STOOLS.  FOLLOW UP IN 3 MOS.

## 2014-06-29 NOTE — Assessment & Plan Note (Signed)
SX CONTROLLED WITH PRN TUMS.  CONTINUE TO MONITOR SYMPTOMS.

## 2014-06-29 NOTE — Progress Notes (Signed)
cc'ed to pcp °

## 2014-06-30 ENCOUNTER — Telehealth: Payer: Self-pay

## 2014-06-30 NOTE — Telephone Encounter (Signed)
REVIEWED. AGREE. NO ADDITIONAL RECOMMENDATIONS. 

## 2014-06-30 NOTE — Telephone Encounter (Signed)
Patient was calling to see about taking her medication before her test. I called Threasa Beards and she said that that was fine just no PPI of stomach medication. Patient understood.

## 2014-07-04 ENCOUNTER — Encounter (HOSPITAL_COMMUNITY): Payer: Self-pay | Admitting: *Deleted

## 2014-07-04 ENCOUNTER — Ambulatory Visit (HOSPITAL_COMMUNITY)
Admission: RE | Admit: 2014-07-04 | Discharge: 2014-07-04 | Disposition: A | Discharge status: Home / Self Care | Payer: Medicaid Other | Source: Ambulatory Visit | Attending: Gastroenterology | Admitting: Gastroenterology

## 2014-07-04 ENCOUNTER — Encounter (HOSPITAL_COMMUNITY)
Admission: RE | Disposition: A | Discharge status: Home / Self Care | Payer: Self-pay | Source: Ambulatory Visit | Attending: Gastroenterology

## 2014-07-04 DIAGNOSIS — K3184 Gastroparesis: Secondary | ICD-10-CM | POA: Insufficient documentation

## 2014-07-04 DIAGNOSIS — K5901 Slow transit constipation: Secondary | ICD-10-CM | POA: Diagnosis present

## 2014-07-04 HISTORY — PX: SMART PILL PROCEDURE: SHX6496

## 2014-07-04 SURGERY — CAPSULE ENDOSCOPY, USING SMARTPILL MOTILITY TESTING SYSTEM

## 2014-07-05 ENCOUNTER — Encounter (HOSPITAL_COMMUNITY): Payer: Self-pay | Admitting: Gastroenterology

## 2014-07-07 ENCOUNTER — Telehealth: Payer: Self-pay | Admitting: Gastroenterology

## 2014-07-07 DIAGNOSIS — K3184 Gastroparesis: Secondary | ICD-10-CM

## 2014-07-07 NOTE — Telephone Encounter (Signed)
I spoke to Dr. Oneida Alar and she said to tell pt to start taking her GI meds today. I called and informed pt.

## 2014-07-07 NOTE — Telephone Encounter (Signed)
Pt called to speak with nurse, but nurse was at lunch. Pt said that she hasn't had a BM since last Saturday and she took the smart pill on Monday and hasn't past it yet. She can not take her GI medicines until she does pass it. She is scheduled for something at noon tomorrow and hospital told her to call us to make recommendations. Please call patient at [phone]

## 2014-07-07 NOTE — Telephone Encounter (Signed)
I called pt and she said she has not had a BM since Sat and she is scheduled to go back to [facility] tomorrow. She swallowed the smart pill and cannot pass it. ( Could not take her GI meds during this time).  PLEASE ADVISE TODAY!

## 2014-07-13 NOTE — Telephone Encounter (Signed)
REVIEWED. AGREE. NO ADDITIONAL RECOMMENDATIONS. 

## 2014-07-15 DIAGNOSIS — K3184 Gastroparesis: Secondary | ICD-10-CM | POA: Insufficient documentation

## 2014-07-15 DIAGNOSIS — K5901 Slow transit constipation: Secondary | ICD-10-CM

## 2014-07-15 NOTE — Procedures (Addendum)
SMART PILL TEST REPORT  TEST STATISTICS GASTRIC EMPTYING TIME:  5:34,  >4h SUGGESTS DELAYED GASTRIC EMPTYING    SMALL BOWEL TRANSIT TIME:   2:58,  NORMAL 2.5H-6H COLONIC TRANSIT TIME:  77: 26 > 59H INDICATES DELAYED COLONIC TRANSIT  DIAGNOSIS:GASTROPARESIS, SLOW TRANSIT CONSTIPATION  PLAN: 1. CONSIDER SURGERY REFERRAL 2. FOLLOW A GASTROPARESIS DIET 3. CONSIDER FIBER POWDER SUPPLEMENTS 4. CHECK TSH, CORTISOL, AND HGA1C. 5. OPV APR 2016.

## 2014-07-15 NOTE — Addendum Note (Signed)
Addended by: Danie Binder on: 07/15/2014 03:39 PM   Modules accepted: Orders

## 2014-07-15 NOTE — Telephone Encounter (Signed)
Called patient TO DISCUSS RESULTS. EXPLAINED SMART PILL SHOWED DELAYED GASTRIC AND COLONIC EMPTYING. PT NEEDS TO CONSIDER BENEFITS V. RISKS OF SUBTOTAL COLECTOMY. SHE WILL THINK ABOUT IT/DISCUSS WITH HER HUSBAND AND LET ME KNOW WHEN TO PROCEED WITH SURGERY REFERRAL.   PT NEEDS A GASTROPARESIS DIET. NEEDS TSH, HGA1C, AND CORTISOL LEVEL CHECKED. C/O OCCASIONAL NAUSEA BUT NO VOMITING.Marland Kitchen

## 2014-07-18 ENCOUNTER — Other Ambulatory Visit: Payer: Self-pay

## 2014-07-18 DIAGNOSIS — R110 Nausea: Secondary | ICD-10-CM

## 2014-07-19 ENCOUNTER — Other Ambulatory Visit: Payer: Self-pay

## 2014-07-19 DIAGNOSIS — R110 Nausea: Secondary | ICD-10-CM

## 2014-07-19 NOTE — Telephone Encounter (Signed)
I called pt. Lab orders with instructions for the Cortisol AM and the gastroparesis diet in the mail to pt.

## 2014-07-21 ENCOUNTER — Telehealth: Payer: Self-pay | Admitting: Gastroenterology

## 2014-07-21 NOTE — Telephone Encounter (Signed)
Patient called today asking if we could send her information for her to read up on regarding the procedure SF had discussed with her. I asked if she wanted a nurse to call her back, but she said no, she would rather have the information mailed to her.

## 2014-07-21 NOTE — Telephone Encounter (Signed)
Dr. Oneida Alar, please advise on what I need to send to the pt.

## 2014-07-21 NOTE — Telephone Encounter (Signed)
PLEASE CALL PT. We do not have any information  But she may GET INFORMATION FROM THE INTERNET OR TALK WITH HER SURGEON. THE PROCEDURE SHE WOULD WOULD BE A  SUBTOTAL COLECTOMY.

## 2014-07-21 NOTE — Telephone Encounter (Signed)
Pt is aware.  

## 2014-07-22 ENCOUNTER — Other Ambulatory Visit: Payer: Self-pay

## 2014-07-22 ENCOUNTER — Telehealth: Payer: Self-pay | Admitting: Gastroenterology

## 2014-07-22 NOTE — Telephone Encounter (Signed)
Patient had called yesterday wanting to get info about surgery that SF had discussed with her and DS has mailed her that information. Patient has called again this morning saying she read up on the procedure and is ready to get that set up. Per SF she needs a Psychologist, sport and exercise for a subtotal colectomy. Please call patient at [phone]

## 2014-07-22 NOTE — Telephone Encounter (Signed)
REFER PT TO DR. Arnoldo Morale FOR SUBTOTAL COLECTOMY, Dx: CONSTIPATION REFRACTORY TO MEDICAL MANAGEMENT.

## 2014-07-22 NOTE — Telephone Encounter (Deleted)
Does pt need a referral to [facility] or Arnoldo Morale?

## 2014-07-22 NOTE — Telephone Encounter (Signed)
Noted  

## 2014-07-25 ENCOUNTER — Other Ambulatory Visit: Payer: Self-pay

## 2014-07-25 ENCOUNTER — Telehealth: Payer: Self-pay | Admitting: Gastroenterology

## 2014-07-25 DIAGNOSIS — K5900 Constipation, unspecified: Secondary | ICD-10-CM

## 2014-07-25 LAB — HEMOGLOBIN A1C
HEMOGLOBIN A1C: 5.8 % — AB (ref <5.7)
Mean Plasma Glucose: 120 mg/dL — ABNORMAL HIGH (ref <117)

## 2014-07-25 LAB — TSH: TSH: 1.693 u[IU]/mL (ref 0.350–4.500)

## 2014-07-25 NOTE — Telephone Encounter (Signed)
Referral made to [facility]. Called pt and she is aware.

## 2014-07-25 NOTE — Telephone Encounter (Signed)
PATIENT CAME IN OFFICE TODAY TO CHECK ON BEING SCHEDULED FOR SURGERY,  ALSO HAD LABS DRAWN TODAY

## 2014-07-25 NOTE — Telephone Encounter (Signed)
Referral has been made to Dr. Arnoldo Morale. Pt aware.

## 2014-07-26 LAB — CORTISOL-AM, BLOOD: Cortisol - AM: 18 ug/dL (ref 4.3–22.4)

## 2014-08-03 NOTE — Telephone Encounter (Signed)
LMOM to call.

## 2014-08-03 NOTE — Telephone Encounter (Signed)
PLEASE CALL PT. HER CORTISOL, THYROID, AND DIABETES TEST ARE NORMAL. SHE HAS IDIOPATHIC GASTROPARESIS, WHICH MEANS WE DON;T KNOW WHY HER STOMACH IS WEAK. MANY TIMES IT MAY HAVE BEEN CAUSED BY A VIRUS AND IT USUALLY DOESN'T GET WORSE ANS SOMETIME IT WILL GET BETTER.

## 2014-08-03 NOTE — Telephone Encounter (Signed)
Pt called and is aware.  

## 2014-08-17 NOTE — Patient Instructions (Addendum)
Kourtney Terriquez  08/17/2014   Your procedure is scheduled on:  08/24/2014  Report to [facility] at 6:15 AM.  Call this number if you have problems the morning of surgery: [phone]   Remember:   Do not eat food or drink liquids after midnight.   Take these medicines the morning of surgery with A SIP OF WATER: Xanax, Bentyl, Neurontin,Topamax, Oxycodone, Mobic, Claritin and Linzess.   Do not wear jewelry, make-up or nail polish.  Do not wear lotions, powders, or perfumes. You may wear deodorant.  Do not shave 48 hours prior to surgery. Men may shave face and neck.  Do not bring valuables to the hospital.  [facility] is not responsible                  for any belongings or valuables.               Contacts, dentures or bridgework may not be worn into surgery.  Leave suitcase in the car. After surgery it may be brought to your room.  For patients admitted to the hospital, discharge time is determined by your                treatment team.               Patients discharged the day of surgery will not be allowed to drive  home.  Name and phone number of your driver: family  Special Instructions: Shower using CHG 2 nights before surgery and the night before surgery.  If you shower the day of surgery use CHG.  Use special wash - you have one bottle of CHG for all showers.  You should use approximately 1/3 of the bottle for each shower.   Please read over the following fact sheets that you were given: Care and Recovery After Surgery            PATIENT INSTRUCTIONS POST-ANESTHESIA  IMMEDIATELY FOLLOWING SURGERY:  Do not drive or operate machinery for the first twenty four hours after surgery.  Do not make any important decisions for twenty four hours after surgery or while taking narcotic pain medications or sedatives.  If you develop intractable nausea and vomiting or a severe headache please notify your doctor immediately.  FOLLOW-UP:  Please make an appointment with your surgeon  as instructed. You do not need to follow up with anesthesia unless specifically instructed to do so.  WOUND CARE INSTRUCTIONS (if applicable):  Keep a dry clean dressing on the anesthesia/puncture wound site if there is drainage.  Once the wound has quit draining you may leave it open to air.  Generally you should leave the bandage intact for twenty four hours unless there is drainage.  If the epidural site drains for more than 36-48 hours please call the anesthesia department.  QUESTIONS?:  Please feel free to call your physician or the hospital operator if you have any questions, and they will be happy to assist you.      Open Colectomy An open colectomy is surgery to take out part or all of the large intestine (colon). This procedure is used to treat several conditions, including:  Inflammation and infection of the colon (diverticulitis).  Tumors or masses in the colon.  Inflammatory bowel disease, such as Crohn disease or ulcerative colitis. Colectomy is an option when symptoms cannot be controlled with medicines.  Bleeding from the colon that cannot be controlled by another method.  Blockage or obstruction of the colon. LET  YOUR HEALTH CARE PROVIDER KNOW ABOUT:  Any allergies you have.  All medicines you are taking, including vitamins, herbs, eye drops, creams, and over-the-counter medicines.  Previous problems you or members of your family have had with the use of anesthetics.  Any blood disorders you have.  Previous surgeries you have had.  Medical conditions you have. RISKS AND COMPLICATIONS  Generally, this is a safe procedure. However, as with any procedure, complications can occur. Possible complications include:  An infection developing in the area where the surgery was done.  Problems with the incisions, including:  Bleeding from an incision.  The wound reopening.  Tissues from inside the abdomen bulging through the incision (hernia).  Bleeding inside the  abdomen.  Reopening of the colon where it was stitched or stapled together. This is a serious complication. Another procedure may be needed to fix the problem.  Damage to other organs in the abdomen.  A blood clot forming in a vein and traveling to the lungs.  Future blockage of the small intestine from scar tissue. Another surgery may be needed to repair this. BEFORE THE PROCEDURE  Various tests may be done before the procedure. These may include:  Blood tests.  A test to check the heart's rhythm (electrocardiography).  A CT scan to get pictures of your abdomen.  Ask your health care provider about changing or stopping any regular medicines. You will need to stop taking aspirin and nonsteroidal anti-inflammatory drugs (NSAIDs) at least 5 days before the surgery. You will also need to stop taking any blood thinners and vitamin E.  You may be prescribed an oral bowel prep. This involves drinking a large amount of medicated liquid, starting the day before your surgery. The liquid will cause you to have multiple loose stools until your stool is almost clear or light green. This cleans out your colon in preparation for the surgery.  Do not eat or drink anything else once you have started the bowel prep, unless your health care provider tells you it is safe to do so.  You may also be given antibiotic pills to clean out your colon of bacteria. Be sure to follow the directions carefully and take the medicine at the correct time.  Make plans to have someone drive you home after your hospital stay. Also arrange for someone to help you with activities during your recovery. PROCEDURE This surgery can take 2-4 hours.  Small monitors will be put on your body. They are used to check your heart, blood pressure, and oxygen level.  An IV access tube will be put into one of your veins. Medicine will be able to flow directly into your body through this IV tube.  You might be given a medicine to help  you relax (sedative).  You will be given a medicine to make you sleep through the procedure (general anesthetic). A breathing tube may be placed into your lungs during the procedure.  A thin, flexible tube (catheter) will be placed into your bladder to collect urine.  A tube may be put in through your nose. It is called a nasogastric tube. It is used to remove stomach fluids after surgery until the intestines start working again.  Once you are asleep, the surgeon will make an incision in the abdomen.  Clamps or staples are put on both ends of the diseased part of the colon.  The part of the intestine between the clamps or staples is removed.  If possible, the ends of the healthy  colon that remain will be stitched or stapled together to allow your body to expel waste (stool).  Sometimes, the remaining colon cannot be stitched back together. If this is the case, a colostomy is needed. For a colostomy:  An opening (stoma) to the outside of your body is made through the abdomen.  The end of the colon is brought to the opening. It is stitched to the skin.  A bag is attached to the opening. Stool will drain into this bag. The bag is removable.  The colostomy can be temporary or permanent.  The incision from the colectomy will be closed with stitches or staples. AFTER THE PROCEDURE  You will stay in a recovery area until the anesthetic has worn off. Your blood pressure and pulse will be checked often. Then you will be taken to a hospital room.  You will continue to get fluids through the IV tube for a while. The IV tube will be taken out when the colon starts working again.  You will start on clear liquids and gradually go back to a normal diet.  You will be encouraged to cough and to take deep breaths to open your lungs and prevent pneumonia.  Some pain is normal after a colectomy. You will be given pain medicine as needed.  You will be urged to get up and start walking within a  day.  If you had a colostomy, your health care provider will explain how it works and what you will need to do.  You will likely need to stay in the hospital for 3-7 days. Document Released: 04/07/2009 Document Revised: 03/31/2013 Document Reviewed: 01/20/2013 [facility] Patient Information 2015 [place], [place]. This information is not intended to replace advice given to you by your health care provider. Make sure you discuss any questions you have with your health care provider.

## 2014-08-18 ENCOUNTER — Encounter (HOSPITAL_COMMUNITY): Payer: Self-pay

## 2014-08-18 ENCOUNTER — Encounter (HOSPITAL_COMMUNITY)
Admission: RE | Admit: 2014-08-18 | Discharge: 2014-08-18 | Disposition: A | Discharge status: Home / Self Care | Payer: Medicaid Other | Source: Ambulatory Visit | Attending: General Surgery | Admitting: General Surgery

## 2014-08-18 DIAGNOSIS — Z01818 Encounter for other preprocedural examination: Secondary | ICD-10-CM | POA: Insufficient documentation

## 2014-08-18 DIAGNOSIS — K5660 Unspecified intestinal obstruction: Secondary | ICD-10-CM | POA: Diagnosis not present

## 2014-08-18 HISTORY — DX: Other intervertebral disc degeneration, thoracic region: M51.34

## 2014-08-18 HISTORY — DX: Other chronic pain: G89.29

## 2014-08-18 HISTORY — DX: Polyneuropathy, unspecified: G62.9

## 2014-08-18 HISTORY — DX: Dorsalgia, unspecified: M54.9

## 2014-08-18 LAB — BASIC METABOLIC PANEL
Anion gap: 5 (ref 5–15)
BUN: 20 mg/dL (ref 6–23)
CO2: 22 mmol/L (ref 19–32)
CREATININE: 0.84 mg/dL (ref 0.50–1.10)
Calcium: 9.8 mg/dL (ref 8.4–10.5)
Chloride: 110 mmol/L (ref 96–112)
GFR calc Af Amer: >90 mL/min (ref >90)
GFR calc non Af Amer: 83 mL/min — ABNORMAL LOW (ref >90)
Glucose, Bld: 94 mg/dL (ref 70–99)
POTASSIUM: 4.4 mmol/L (ref 3.5–5.1)
Sodium: 137 mmol/L (ref 135–145)

## 2014-08-18 LAB — CBC WITH DIFFERENTIAL/PLATELET
Basophils Absolute: 0.1 10*3/uL (ref 0.0–0.1)
Basophils Relative: 2 % — ABNORMAL HIGH (ref 0–1)
EOS ABS: 0.4 10*3/uL (ref 0.0–0.7)
Eosinophils Relative: 6 % — ABNORMAL HIGH (ref 0–5)
HCT: 40.2 % (ref 36.0–46.0)
Hemoglobin: 13.2 g/dL (ref 12.0–15.0)
Lymphocytes Relative: 30 % (ref 12–46)
Lymphs Abs: 1.9 10*3/uL (ref 0.7–4.0)
MCH: 30.1 pg (ref 26.0–34.0)
MCHC: 32.8 g/dL (ref 30.0–36.0)
MCV: 91.6 fL (ref 78.0–100.0)
Monocytes Absolute: 0.6 10*3/uL (ref 0.1–1.0)
Monocytes Relative: 9 % (ref 3–12)
NEUTROS PCT: 53 % (ref 43–77)
Neutro Abs: 3.3 10*3/uL (ref 1.7–7.7)
Platelets: 236 10*3/uL (ref 150–400)
RBC: 4.39 MIL/uL (ref 3.87–5.11)
RDW: 13.3 % (ref 11.5–15.5)
WBC: 6.2 10*3/uL (ref 4.0–10.5)

## 2014-08-18 LAB — PREPARE RBC (CROSSMATCH)

## 2014-08-18 NOTE — H&P (Signed)
  NTS SOAP Note  Vital Signs:  Vitals as of: 8/36/6294: Systolic 765: Diastolic 84: Heart Rate 86: Temp 97.41F: Height 59ft 3in: Weight 142Lbs 0 Ounces: Pain Level 9: BMI 25.15  BMI : 25.15 kg/m2  Subjective: This 44 year old female presents for of chronic constipation.  Has been present since childhood.  Is on many cathartics to have a bowel movement.  Sometimes her movements are small and liquid.  Does suffer from tenesmus.  TCS from Dr. Oneida Alar reviewed.  Does have both gastroparesis and delayed transit.  Review of Symptoms:  Constitutional:fatigue, weakness, fever, chills headache Eyes:unremarkable   sinus problems Respiratory:unremunremarkablearkable Gastrointestinabdominal pain, nausea, constipation, heartburn, tenesmus Genitourinary:unremarkable   joint,  neck,  and back pain Skin:unremarkable Hematolgic/Lymphatic:unremarkable   hay fever   Past Medical History:  Reviewed  Past Medical History  Surgical History: back surgery (rods placed),  knee surgery,  breast biopsy,  BTL,  TCS,  mouth surgery Medical Problems: headaches,  chronic constipation,  bowel dysfunction,  anxiety Allergies: beesix,  pcn,  asa,  codeine Medications: xanax,  biotin,  amitiza,  cipro,  restasis,  valium,  lasix,  neurontin,  claritin,  alrex eye drops,  oxycodone,  topamax,  bentyl,  senokot    Social History:Reviewed  Social History  Preferred Language: English Race:  White Ethnicity: Not Hispanic / Latino Age: 66 year Marital Status:  M   Smoking Status: Current every day smoker reviewed on 08/16/2014 Started Date:  Packs per day: 1.50 Functional Status reviewed on 08/16/2014 ------------------------------------------------ Bathing: Normal Cooking: Normal Dressing: Normal Driving: Normal Eating: Normal Managing Meds: Normal Oral Care: Normal Shopping: Normal Toileting: Normal Transferring: Normal Walking: Normal Cognitive Status reviewed on  08/16/2014 ------------------------------------------------ Attention: Normal Decision Making: Normal Language: Normal Memory: Normal Motor: Normal Perception: Normal Problem Solving: Normal Visual and Spatial: Normal   Family History:Reviewed  Family Health History Mother  Father  Sister, Deceased; Colon cancer;     Objective Information: General:Well appearing, well nourished in no distress. Heart:RRR, no murmur or gallop.  Normal S1, S2.  No S3, S4.  Lungs:  CTA bilaterally, no wheezes, rhonchi, rales.  Breathing unlabored. Abdomen:Soft, NT/ND, no HSM, no masses.  Assessment:chronic constipation,  pseudoobstruction of colon  Diagnoses: 564.89  K59.00 Atonic constipation (Constipation, unspecified)  Procedures: 46503 - OFFICE OUTPATIENT NEW 30 MINUTES    Plan:  Scheduled for near total colectomy on 08/24/14.   Patient Education:Alternative treatments to surgery were discussed with patient (and family).  Risks and benefits  of procedure including bleeding,  infection,  anastomotic leak,  and ileostomy were fully explained to the patient (and family) who gave informed consent. Patient/family questions were addressed.Trilyte,  neomycin,  and erythromycin prescribed.  Follow-up:Pending Surgery

## 2014-08-19 LAB — ABO/RH: ABO/RH(D): A POS

## 2014-08-24 ENCOUNTER — Inpatient Hospital Stay (HOSPITAL_COMMUNITY): Payer: Medicaid Other | Admitting: Anesthesiology

## 2014-08-24 ENCOUNTER — Inpatient Hospital Stay (HOSPITAL_COMMUNITY)
Admission: RE | Admit: 2014-08-24 | Discharge: 2014-08-29 | DRG: 330 | Disposition: A | Discharge status: Home / Self Care | Payer: Medicaid Other | Source: Ambulatory Visit | Attending: General Surgery | Admitting: General Surgery

## 2014-08-24 ENCOUNTER — Other Ambulatory Visit: Payer: Self-pay | Admitting: Gastroenterology

## 2014-08-24 ENCOUNTER — Encounter (HOSPITAL_COMMUNITY): Payer: Self-pay

## 2014-08-24 ENCOUNTER — Encounter (HOSPITAL_COMMUNITY)
Admission: RE | Disposition: A | Discharge status: Home / Self Care | Payer: Self-pay | Source: Ambulatory Visit | Attending: General Surgery

## 2014-08-24 DIAGNOSIS — D62 Acute posthemorrhagic anemia: Secondary | ICD-10-CM | POA: Diagnosis not present

## 2014-08-24 DIAGNOSIS — K219 Gastro-esophageal reflux disease without esophagitis: Secondary | ICD-10-CM | POA: Diagnosis present

## 2014-08-24 DIAGNOSIS — K3184 Gastroparesis: Secondary | ICD-10-CM | POA: Diagnosis present

## 2014-08-24 DIAGNOSIS — F17200 Nicotine dependence, unspecified, uncomplicated: Secondary | ICD-10-CM | POA: Diagnosis present

## 2014-08-24 DIAGNOSIS — K5981 Ogilvie syndrome: Secondary | ICD-10-CM | POA: Diagnosis present

## 2014-08-24 DIAGNOSIS — K598 Other specified functional intestinal disorders: Secondary | ICD-10-CM | POA: Diagnosis present

## 2014-08-24 DIAGNOSIS — K5909 Other constipation: Secondary | ICD-10-CM | POA: Diagnosis present

## 2014-08-24 HISTORY — DX: Ogilvie syndrome: K59.81

## 2014-08-24 HISTORY — PX: COLECTOMY: SHX59

## 2014-08-24 SURGERY — COLECTOMY, TOTAL
Anesthesia: General

## 2014-08-24 MED ORDER — ACETAMINOPHEN 325 MG PO TABS
650.0000 mg | ORAL_TABLET | ORAL | Status: DC | PRN
  Administered 2014-08-24 – 2014-08-26 (×2): 650 mg via ORAL
  Filled 2014-08-24 (×2): qty 2

## 2014-08-24 MED ORDER — HYDROMORPHONE HCL 1 MG/ML IJ SOLN
0.5000 mg | INTRAMUSCULAR | Status: DC | PRN
  Administered 2014-08-24 (×2): 0.5 mg via INTRAVENOUS

## 2014-08-24 MED ORDER — SODIUM CHLORIDE 0.9 % IJ SOLN
INTRAMUSCULAR | Status: AC
  Filled 2014-08-24: qty 10

## 2014-08-24 MED ORDER — SUCCINYLCHOLINE CHLORIDE 20 MG/ML IJ SOLN
INTRAMUSCULAR | Status: AC
  Filled 2014-08-24: qty 1

## 2014-08-24 MED ORDER — HEMOSTATIC AGENTS (NO CHARGE) OPTIME
TOPICAL | Status: DC | PRN
  Administered 2014-08-24: 1 via TOPICAL

## 2014-08-24 MED ORDER — 0.9 % SODIUM CHLORIDE (POUR BTL) OPTIME
TOPICAL | Status: DC | PRN
  Administered 2014-08-24 (×2): 2000 mL

## 2014-08-24 MED ORDER — ARTIFICIAL TEARS OP OINT
TOPICAL_OINTMENT | OPHTHALMIC | Status: AC
  Filled 2014-08-24: qty 3.5

## 2014-08-24 MED ORDER — HYDROMORPHONE HCL 1 MG/ML IJ SOLN
2.0000 mg | INTRAMUSCULAR | Status: DC | PRN
  Administered 2014-08-24 – 2014-08-29 (×28): 2 mg via INTRAVENOUS
  Filled 2014-08-24 (×28): qty 2

## 2014-08-24 MED ORDER — ONDANSETRON HCL 4 MG/2ML IJ SOLN
INTRAMUSCULAR | Status: AC
  Filled 2014-08-24: qty 2

## 2014-08-24 MED ORDER — METRONIDAZOLE IN NACL 5-0.79 MG/ML-% IV SOLN
INTRAVENOUS | Status: AC
  Filled 2014-08-24: qty 100

## 2014-08-24 MED ORDER — MIDAZOLAM HCL 2 MG/2ML IJ SOLN
INTRAMUSCULAR | Status: AC
  Filled 2014-08-24: qty 2

## 2014-08-24 MED ORDER — LIDOCAINE HCL (CARDIAC) 20 MG/ML IV SOLN
INTRAVENOUS | Status: DC | PRN
  Administered 2014-08-24: 50 mg via INTRAVENOUS

## 2014-08-24 MED ORDER — SCOPOLAMINE 1 MG/3DAYS TD PT72
1.0000 | MEDICATED_PATCH | Freq: Once | TRANSDERMAL | Status: DC
  Administered 2014-08-24: 1.5 mg via TRANSDERMAL

## 2014-08-24 MED ORDER — MIDAZOLAM HCL 2 MG/2ML IJ SOLN
1.0000 mg | INTRAMUSCULAR | Status: DC | PRN
Start: 2014-08-24 — End: 2014-08-24
  Administered 2014-08-24: 2 mg via INTRAVENOUS

## 2014-08-24 MED ORDER — GABAPENTIN 300 MG PO CAPS
300.0000 mg | ORAL_CAPSULE | Freq: Three times a day (TID) | ORAL | Status: DC
Start: 2014-08-24 — End: 2014-08-29
  Administered 2014-08-24 – 2014-08-29 (×15): 300 mg via ORAL
  Filled 2014-08-24 (×15): qty 1

## 2014-08-24 MED ORDER — NEOSTIGMINE METHYLSULFATE 10 MG/10ML IV SOLN
INTRAVENOUS | Status: AC
  Filled 2014-08-24: qty 1

## 2014-08-24 MED ORDER — ROCURONIUM BROMIDE 100 MG/10ML IV SOLN
INTRAVENOUS | Status: DC | PRN
  Administered 2014-08-24: 10 mg via INTRAVENOUS
  Administered 2014-08-24 (×2): 5 mg via INTRAVENOUS
  Administered 2014-08-24: 10 mg via INTRAVENOUS
  Administered 2014-08-24: 20 mg via INTRAVENOUS

## 2014-08-24 MED ORDER — OLOPATADINE HCL 0.1 % OP SOLN
1.0000 [drp] | Freq: Two times a day (BID) | OPHTHALMIC | Status: DC
  Administered 2014-08-24 – 2014-08-29 (×10): 1 [drp] via OPHTHALMIC
  Filled 2014-08-24: qty 5

## 2014-08-24 MED ORDER — POVIDONE-IODINE 10 % EX OINT
TOPICAL_OINTMENT | CUTANEOUS | Status: DC | PRN
  Administered 2014-08-24: 1 via TOPICAL

## 2014-08-24 MED ORDER — FENTANYL CITRATE 0.05 MG/ML IJ SOLN
25.0000 ug | INTRAMUSCULAR | Status: AC
  Administered 2014-08-24 (×2): 25 ug via INTRAVENOUS
  Filled 2014-08-24: qty 2

## 2014-08-24 MED ORDER — NEOSTIGMINE METHYLSULFATE 10 MG/10ML IV SOLN
INTRAVENOUS | Status: DC | PRN
  Administered 2014-08-24: 4 mg via INTRAVENOUS

## 2014-08-24 MED ORDER — MIDAZOLAM HCL 5 MG/5ML IJ SOLN
INTRAMUSCULAR | Status: DC | PRN
  Administered 2014-08-24: 2 mg via INTRAVENOUS

## 2014-08-24 MED ORDER — ONDANSETRON HCL 4 MG PO TABS
4.0000 mg | ORAL_TABLET | Freq: Four times a day (QID) | ORAL | Status: DC | PRN
  Administered 2014-08-27 – 2014-08-29 (×2): 4 mg via ORAL
  Filled 2014-08-24 (×2): qty 1

## 2014-08-24 MED ORDER — ALVIMOPAN 12 MG PO CAPS
12.0000 mg | ORAL_CAPSULE | Freq: Two times a day (BID) | ORAL | Status: DC
  Administered 2014-08-25 – 2014-08-27 (×5): 12 mg via ORAL
  Filled 2014-08-24 (×5): qty 1

## 2014-08-24 MED ORDER — ROCURONIUM BROMIDE 50 MG/5ML IV SOLN
INTRAVENOUS | Status: AC
  Filled 2014-08-24: qty 1

## 2014-08-24 MED ORDER — SCOPOLAMINE 1 MG/3DAYS TD PT72
MEDICATED_PATCH | TRANSDERMAL | Status: AC
  Filled 2014-08-24: qty 1

## 2014-08-24 MED ORDER — LOTEPREDNOL ETABONATE 0.5 % OP SUSP
1.0000 [drp] | Freq: Two times a day (BID) | OPHTHALMIC | Status: DC
  Administered 2014-08-24 – 2014-08-29 (×10): 1 [drp] via OPHTHALMIC
  Filled 2014-08-24: qty 5

## 2014-08-24 MED ORDER — BUPIVACAINE LIPOSOME 1.3 % IJ SUSP
INTRAMUSCULAR | Status: AC
  Filled 2014-08-24: qty 20

## 2014-08-24 MED ORDER — FENTANYL CITRATE 0.05 MG/ML IJ SOLN
INTRAMUSCULAR | Status: AC
  Filled 2014-08-24: qty 2

## 2014-08-24 MED ORDER — PROPOFOL 10 MG/ML IV BOLUS
INTRAVENOUS | Status: AC
  Filled 2014-08-24: qty 20

## 2014-08-24 MED ORDER — SUFENTANIL CITRATE 50 MCG/ML IV SOLN
INTRAVENOUS | Status: AC
  Filled 2014-08-24: qty 1

## 2014-08-24 MED ORDER — KETOROLAC TROMETHAMINE 30 MG/ML IJ SOLN
30.0000 mg | Freq: Once | INTRAMUSCULAR | Status: AC
  Administered 2014-08-24: 30 mg via INTRAVENOUS
  Filled 2014-08-24: qty 1

## 2014-08-24 MED ORDER — OXYCODONE-ACETAMINOPHEN 5-325 MG PO TABS
1.0000 | ORAL_TABLET | ORAL | Status: DC | PRN
  Administered 2014-08-26 – 2014-08-28 (×5): 2 via ORAL
  Filled 2014-08-24 (×5): qty 2

## 2014-08-24 MED ORDER — LACTATED RINGERS IV SOLN
INTRAVENOUS | Status: DC
  Administered 2014-08-24 – 2014-08-25 (×2): via INTRAVENOUS

## 2014-08-24 MED ORDER — CIPROFLOXACIN IN D5W 400 MG/200ML IV SOLN
INTRAVENOUS | Status: AC
  Filled 2014-08-24: qty 200

## 2014-08-24 MED ORDER — TOPIRAMATE 25 MG PO TABS
50.0000 mg | ORAL_TABLET | Freq: Two times a day (BID) | ORAL | Status: DC
  Administered 2014-08-24 – 2014-08-29 (×11): 50 mg via ORAL
  Filled 2014-08-24 (×15): qty 2

## 2014-08-24 MED ORDER — DEXAMETHASONE SODIUM PHOSPHATE 4 MG/ML IJ SOLN
4.0000 mg | Freq: Once | INTRAMUSCULAR | Status: AC
  Administered 2014-08-24: 4 mg via INTRAVENOUS
  Filled 2014-08-24: qty 1

## 2014-08-24 MED ORDER — GLYCOPYRROLATE 0.2 MG/ML IJ SOLN
INTRAMUSCULAR | Status: AC
  Filled 2014-08-24: qty 2

## 2014-08-24 MED ORDER — GLYCOPYRROLATE 0.2 MG/ML IJ SOLN
INTRAMUSCULAR | Status: AC
  Filled 2014-08-24: qty 1

## 2014-08-24 MED ORDER — ONDANSETRON HCL 4 MG/2ML IJ SOLN: 4.0000 mg | Freq: Once | INTRAMUSCULAR | Status: DC | PRN

## 2014-08-24 MED ORDER — SUCCINYLCHOLINE CHLORIDE 20 MG/ML IJ SOLN
INTRAMUSCULAR | Status: DC | PRN
  Administered 2014-08-24: 100 mg via INTRAVENOUS

## 2014-08-24 MED ORDER — LACTATED RINGERS IV SOLN
INTRAVENOUS | Status: DC | PRN
  Administered 2014-08-24 (×3): via INTRAVENOUS

## 2014-08-24 MED ORDER — BUPIVACAINE LIPOSOME 1.3 % IJ SUSP
INTRAMUSCULAR | Status: DC | PRN
  Administered 2014-08-24: 20 mL

## 2014-08-24 MED ORDER — CYCLOSPORINE 0.05 % OP EMUL
1.0000 [drp] | Freq: Two times a day (BID) | OPHTHALMIC | Status: DC
  Administered 2014-08-24 – 2014-08-29 (×10): 1 [drp] via OPHTHALMIC
  Filled 2014-08-24 (×14): qty 1

## 2014-08-24 MED ORDER — ALVIMOPAN 12 MG PO CAPS
ORAL_CAPSULE | ORAL | Status: AC
  Filled 2014-08-24: qty 1

## 2014-08-24 MED ORDER — METRONIDAZOLE IN NACL 5-0.79 MG/ML-% IV SOLN
500.0000 mg | INTRAVENOUS | Status: AC
  Administered 2014-08-24: 500 mg via INTRAVENOUS

## 2014-08-24 MED ORDER — ENOXAPARIN SODIUM 40 MG/0.4ML ~~LOC~~ SOLN
40.0000 mg | SUBCUTANEOUS | Status: DC
  Administered 2014-08-25 – 2014-08-28 (×4): 40 mg via SUBCUTANEOUS
  Filled 2014-08-24 (×4): qty 0.4

## 2014-08-24 MED ORDER — LACTATED RINGERS IV SOLN
INTRAVENOUS | Status: DC
  Administered 2014-08-24: 07:00:00 via INTRAVENOUS

## 2014-08-24 MED ORDER — LIDOCAINE HCL (PF) 1 % IJ SOLN
INTRAMUSCULAR | Status: AC
  Filled 2014-08-24: qty 5

## 2014-08-24 MED ORDER — HYDROMORPHONE HCL 1 MG/ML IJ SOLN
INTRAMUSCULAR | Status: AC
  Filled 2014-08-24: qty 1

## 2014-08-24 MED ORDER — FENTANYL CITRATE 0.05 MG/ML IJ SOLN
25.0000 ug | INTRAMUSCULAR | Status: DC | PRN
  Administered 2014-08-24 (×4): 50 ug via INTRAVENOUS

## 2014-08-24 MED ORDER — PROPOFOL 10 MG/ML IV BOLUS
INTRAVENOUS | Status: DC | PRN
  Administered 2014-08-24: 150 mg via INTRAVENOUS

## 2014-08-24 MED ORDER — HYDROMORPHONE HCL 1 MG/ML IJ SOLN
1.0000 mg | INTRAMUSCULAR | Status: DC | PRN
  Administered 2014-08-24: 1 mg via INTRAVENOUS
  Filled 2014-08-24: qty 1

## 2014-08-24 MED ORDER — EPHEDRINE SULFATE 50 MG/ML IJ SOLN
INTRAMUSCULAR | Status: AC
  Filled 2014-08-24: qty 1

## 2014-08-24 MED ORDER — CHLORHEXIDINE GLUCONATE 4 % EX LIQD: 1.0000 "application " | Freq: Once | CUTANEOUS | Status: DC

## 2014-08-24 MED ORDER — ARTIFICIAL TEARS OP OINT
TOPICAL_OINTMENT | OPHTHALMIC | Status: DC | PRN
  Administered 2014-08-24: 1 via OPHTHALMIC

## 2014-08-24 MED ORDER — SODIUM CHLORIDE 0.9 % IV SOLN: 0.5000 mg/h | INTRAVENOUS | Status: DC

## 2014-08-24 MED ORDER — ONDANSETRON HCL 4 MG/2ML IJ SOLN
4.0000 mg | Freq: Once | INTRAMUSCULAR | Status: AC
  Administered 2014-08-24: 4 mg via INTRAVENOUS

## 2014-08-24 MED ORDER — ALVIMOPAN 12 MG PO CAPS
12.0000 mg | ORAL_CAPSULE | Freq: Once | ORAL | Status: AC
  Administered 2014-08-24: 12 mg via ORAL

## 2014-08-24 MED ORDER — LORAZEPAM 2 MG/ML IJ SOLN
1.0000 mg | INTRAMUSCULAR | Status: DC | PRN
  Administered 2014-08-28: 1 mg via INTRAVENOUS
  Filled 2014-08-24: qty 1

## 2014-08-24 MED ORDER — CIPROFLOXACIN IN D5W 400 MG/200ML IV SOLN
400.0000 mg | INTRAVENOUS | Status: AC
  Administered 2014-08-24: 400 mg via INTRAVENOUS

## 2014-08-24 MED ORDER — SUFENTANIL CITRATE 50 MCG/ML IV SOLN
INTRAVENOUS | Status: DC | PRN
  Administered 2014-08-24 (×3): 10 ug via INTRAVENOUS
  Administered 2014-08-24: 20 ug via INTRAVENOUS
  Administered 2014-08-24: 10 ug via INTRAVENOUS
  Administered 2014-08-24: 20 ug via INTRAVENOUS
  Administered 2014-08-24 (×5): 10 ug via INTRAVENOUS

## 2014-08-24 MED ORDER — GLYCOPYRROLATE 0.2 MG/ML IJ SOLN
INTRAMUSCULAR | Status: DC | PRN
  Administered 2014-08-24: 0.6 mg via INTRAVENOUS

## 2014-08-24 MED ORDER — POVIDONE-IODINE 10 % EX OINT
TOPICAL_OINTMENT | CUTANEOUS | Status: AC
  Filled 2014-08-24: qty 1

## 2014-08-24 MED ORDER — ONDANSETRON HCL 4 MG/2ML IJ SOLN
4.0000 mg | Freq: Four times a day (QID) | INTRAMUSCULAR | Status: DC | PRN
  Administered 2014-08-24 – 2014-08-29 (×12): 4 mg via INTRAVENOUS
  Filled 2014-08-24 (×12): qty 2

## 2014-08-24 SURGICAL SUPPLY — 68 items
APPLIER CLIP 11 MED OPEN (CLIP)
APPLIER CLIP 13 LRG OPEN (CLIP)
BAG HAMPER (MISCELLANEOUS) ×3 IMPLANT
BARRIER SKIN 2 3/4 (OSTOMY) IMPLANT
BARRIER SKIN 2 3/4 INCH (OSTOMY)
BARRIER SKIN WITH FLANGE 1 3/4 (OSTOMY) IMPLANT
CHLORAPREP W/TINT 26ML (MISCELLANEOUS) ×3 IMPLANT
CLAMP POUCH DRAINAGE QUIET (OSTOMY) IMPLANT
CLIP APPLIE 11 MED OPEN (CLIP) IMPLANT
CLIP APPLIE 13 LRG OPEN (CLIP) IMPLANT
CLOTH BEACON ORANGE TIMEOUT ST (SAFETY) ×3 IMPLANT
COVER LIGHT HANDLE STERIS (MISCELLANEOUS) ×6 IMPLANT
COVER MAYO STAND XLG (DRAPE) ×3 IMPLANT
DRAPE WARM FLUID 44X44 (DRAPE) ×3 IMPLANT
DRSG OPSITE POSTOP 4X10 (GAUZE/BANDAGES/DRESSINGS) ×3 IMPLANT
DRSG OPSITE POSTOP 4X8 (GAUZE/BANDAGES/DRESSINGS) ×3 IMPLANT
ELECT BLADE 6 FLAT ULTRCLN (ELECTRODE) IMPLANT
ELECT REM PT RETURN 9FT ADLT (ELECTROSURGICAL) ×3
ELECTRODE REM PT RTRN 9FT ADLT (ELECTROSURGICAL) ×1 IMPLANT
FORMALIN 10 PREFIL 480ML (MISCELLANEOUS) IMPLANT
GAUZE SPONGE 4X4 12PLY STRL (GAUZE/BANDAGES/DRESSINGS) IMPLANT
GLOVE BIOGEL PI IND STRL 7.0 (GLOVE) ×2 IMPLANT
GLOVE BIOGEL PI IND STRL 7.5 (GLOVE) ×3 IMPLANT
GLOVE BIOGEL PI INDICATOR 7.0 (GLOVE) ×4
GLOVE BIOGEL PI INDICATOR 7.5 (GLOVE) ×6
GLOVE ECLIPSE 6.5 STRL STRAW (GLOVE) ×6 IMPLANT
GLOVE SURG SS PI 7.5 STRL IVOR (GLOVE) ×9 IMPLANT
GOWN STRL REUS W/TWL LRG LVL3 (GOWN DISPOSABLE) ×18 IMPLANT
HEMOSTAT SNOW SURGICEL 2X4 (HEMOSTASIS) ×3 IMPLANT
INST SET MAJOR GENERAL (KITS) ×3 IMPLANT
KIT ROOM TURNOVER APOR (KITS) ×3 IMPLANT
LIGASURE IMPACT 36 18CM CVD LR (INSTRUMENTS) ×3 IMPLANT
MANIFOLD NEPTUNE II (INSTRUMENTS) ×3 IMPLANT
NEEDLE HYPO 18GX1.5 BLUNT FILL (NEEDLE) ×3 IMPLANT
NEEDLE HYPO 21X1.5 SAFETY (NEEDLE) ×3 IMPLANT
NS IRRIG 1000ML POUR BTL (IV SOLUTION) ×12 IMPLANT
PACK ABDOMINAL MAJOR (CUSTOM PROCEDURE TRAY) IMPLANT
PACK COLON (CUSTOM PROCEDURE TRAY) ×3 IMPLANT
PAD ARMBOARD 7.5X6 YLW CONV (MISCELLANEOUS) ×3 IMPLANT
PENCIL HANDSWITCHING (ELECTRODE) IMPLANT
POUCH OSTOMY 2 3/4  H 3804 (WOUND CARE)
POUCH OSTOMY 2 PC DRNBL 2.25 (WOUND CARE) IMPLANT
POUCH OSTOMY 2 PC DRNBL 2.75 (WOUND CARE) IMPLANT
POUCH OSTOMY DRNBL 2 1/4 (WOUND CARE)
RELOAD LINEAR CUT PROX 55 BLUE (ENDOMECHANICALS) IMPLANT
RELOAD PROXIMATE 75MM BLUE (ENDOMECHANICALS) ×6 IMPLANT
RETRACTOR WND ALEXIS 25 LRG (MISCELLANEOUS) ×1 IMPLANT
RTRCTR WOUND ALEXIS 25CM LRG (MISCELLANEOUS) ×3
SET BASIN LINEN APH (SET/KITS/TRAYS/PACK) ×3 IMPLANT
SPONGE LAP 18X18 X RAY DECT (DISPOSABLE) ×9 IMPLANT
STAPLER GUN LINEAR PROX 60 (STAPLE) ×3 IMPLANT
STAPLER PROXIMATE 55 BLUE (STAPLE) IMPLANT
STAPLER PROXIMATE 75MM BLUE (STAPLE) ×3 IMPLANT
STAPLER VISISTAT (STAPLE) ×3 IMPLANT
SUCTION POOLE TIP (SUCTIONS) IMPLANT
SUT CHROMIC 0 SH (SUTURE) IMPLANT
SUT CHROMIC 2 0 SH (SUTURE) ×3 IMPLANT
SUT CHROMIC 3 0 SH 27 (SUTURE) IMPLANT
SUT NOVA NAB GS-26 0 60 (SUTURE) IMPLANT
SUT PDS AB 0 CTX 60 (SUTURE) ×6 IMPLANT
SUT SILK 2 0 (SUTURE)
SUT SILK 2 0 REEL (SUTURE) IMPLANT
SUT SILK 2-0 18XBRD TIE 12 (SUTURE) IMPLANT
SUT SILK 3 0 SH CR/8 (SUTURE) ×6 IMPLANT
TOWEL BLUE STERILE X RAY DET (MISCELLANEOUS) ×3 IMPLANT
TOWEL OR 17X26 4PK STRL BLUE (TOWEL DISPOSABLE) IMPLANT
TRAY FOLEY CATH 16FR SILVER (SET/KITS/TRAYS/PACK) ×3 IMPLANT
YANKAUER SUCT BULB TIP NO VENT (SUCTIONS) IMPLANT

## 2014-08-24 NOTE — Anesthesia Procedure Notes (Signed)
Procedure Name: Intubation Date/Time: 08/24/2014 7:46 AM Performed by: Andree Elk, AMY A Pre-anesthesia Checklist: Patient identified, Patient being monitored, Timeout performed, Emergency Drugs available and Suction available Patient Re-evaluated:Patient Re-evaluated prior to inductionOxygen Delivery Method: Circle System Utilized Preoxygenation: Pre-oxygenation with 100% oxygen Intubation Type: IV induction, Rapid sequence and Cricoid Pressure applied Laryngoscope Size: 3 and Miller Grade View: Grade I Tube type: Oral Tube size: 7.0 mm Number of attempts: 1 Airway Equipment and Method: Stylet Placement Confirmation: ETT inserted through vocal cords under direct vision,  positive ETCO2 and breath sounds checked- equal and bilateral Secured at: 21 cm Tube secured with: Tape Dental Injury: Teeth and Oropharynx as per pre-operative assessment

## 2014-08-24 NOTE — Op Note (Signed)
Patient:  Alicia Tyler  DOB:  26-Jun-1970  MRN:  [id number]   Preop Diagnosis:  Pseudoobstruction of colon  Postop Diagnosis:  Same  Procedure:  Near total colectomy  Surgeon:  Aviva Signs, M.D.  Anes:  Gen. 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 She has failed medical therapy. She now presents for a near total colectomy. The risks and benefits of the procedure including bleeding, infection, cardiopulmonary difficulties, and the possibility of recurrence of the dysmotility were fully explained to the patient, who gave informed consent.  Procedure note:  The patient is placed the supine position. After induction of general endotracheal anesthesia, the abdomen was prepped and draped using usual sterile technique with ChloraPrep. Surgical site confirmation was performed.  A midline incision was made from the umbilicus to the suprapubic region. The peritoneal cavity was entered into without difficulty. The liver, gallbladder, stomach, and small intestine were well within normal limits. The descending colon and descending colon regions were mobilized along the peritoneal reflection. The sigmoid colon was noted to be significantly elongated and tortuous. The sigmoid: Was freed away from the peritoneal reflection without difficulty. The left ureter was identified and kept from the field of dissection. The hepatic flexure as well as the splenic flexure were ligated using LigaSure. A GIA stapler was placed across the terminal ileum and fired. This was likewise done to the distal sigmoid colon. The mesentery of the colon was then divided using the LigaSure. The specimen was then removed from the operative field sent to pathology further examination. A side to side ileocolic anastomosis was performed using a GIA 75 stapler. The enterotomy was closed using a TA 60  stapler. The staple line was bolstered using 3-0 silk sutures. The mesenteric defect was closed using a 2-0 chromic gut running suture. The abdominal cavity was copiously irrigated with normal saline. The splenic flexure was inspected and there was no evidence of injury to the spleen. Surgicel was placed in the splenic flexure. All operating personnel then changed their gowns and gloves. A new setup was then used.  The fascia was reapproximated using a looped 0 PDS running suture. The subcutaneous layer was irrigated normal saline and the skin was closed using staples. Exparel was instilled the surrounding wound. Betadine ointment and a dry sterile dressing were applied.  All tape and needle counts were correct at the end of the procedure. The patient was extubated in the operating room and transferred to PACU in stable condition.  Complications:  None  EBL:  150 mL  Specimen:  Colon

## 2014-08-24 NOTE — Anesthesia Preprocedure Evaluation (Signed)
Anesthesia Evaluation  Patient identified by MRN, date of birth, ID band Patient awake    Reviewed: Allergy & Precautions, H&P , NPO status , Patient's Chart, lab work & pertinent test results  History of Anesthesia Complications (+) PONV and history of anesthetic complications  Airway Mallampati: II  TM Distance: >3 FB Neck ROM: Full    Dental  (+) Dental Advisory Given, Teeth Intact   Pulmonary Current Smoker,  breath sounds clear to auscultation        Cardiovascular negative cardio ROS  Rhythm:Regular Rate:Normal     Neuro/Psych  Headaches, PSYCHIATRIC DISORDERS Anxiety    GI/Hepatic GERD-  Medicated and Poorly Controlled,IBS, gastroparesis   Endo/Other    Renal/GU      Musculoskeletal  (+) Arthritis - (chronic LBP, lumbar fusion),   Abdominal   Peds  Hematology   Anesthesia Other Findings Chronic narcotics for back pain  Reproductive/Obstetrics                             Anesthesia Physical Anesthesia Plan  ASA: III  Anesthesia Plan: General   Post-op Pain Management:    Induction: Intravenous, Rapid sequence and Cricoid pressure planned  Airway Management Planned: Oral ETT  Additional Equipment:   Intra-op Plan:   Post-operative Plan: Extubation in OR  Informed Consent: I have reviewed the patients History and Physical, chart, labs and discussed the procedure including the risks, benefits and alternatives for the proposed anesthesia with the patient or authorized representative who has indicated his/her understanding and acceptance.     Plan Discussed with:   Anesthesia Plan Comments:         Anesthesia Quick Evaluation

## 2014-08-24 NOTE — Transfer of Care (Signed)
Immediate Anesthesia Transfer of Care Note  Patient: Alicia Tyler  Procedure(s) Performed: Procedure(s): TOTAL COLECTOMY (N/A)  Patient Location: PACU  Anesthesia Type:General  Level of Consciousness: awake, oriented and patient cooperative  Airway & Oxygen Therapy: Patient Spontanous Breathing and Patient connected to face mask oxygen  Post-op Assessment: Report given to RN and Post -op Vital signs reviewed and stable  Post vital signs: Reviewed and stable  Last Vitals:  Filed Vitals:   08/24/14 0730  BP: 113/67  Pulse:   Temp:   Resp: 15    Complications: No apparent anesthesia complications

## 2014-08-24 NOTE — Addendum Note (Signed)
Addendum  created 08/24/14 1032 by Mickel Baas, CRNA   Modules edited: Charges VN

## 2014-08-24 NOTE — Interval H&P Note (Signed)
History and Physical Interval Note:  08/24/2014 7:17 AM  Alicia Tyler  has presented today for surgery, with the diagnosis of colonic obstruction  The various methods of treatment have been discussed with the patient and family. After consideration of risks, benefits and other options for treatment, the patient has consented to  Procedure(s): TOTAL COLECTOMY (N/A) as a surgical intervention .  The patient's history has been reviewed, patient examined, no change in status, stable for surgery.  I have reviewed the patient's chart and labs.  Questions were answered to the patient's satisfaction.     Aviva Signs A

## 2014-08-24 NOTE — Anesthesia Postprocedure Evaluation (Signed)
  Anesthesia Post-op Note  Patient: Alicia Tyler  Procedure(s) Performed: Procedure(s): TOTAL COLECTOMY (N/A)  Patient Location: PACU  Anesthesia Type:General  Level of Consciousness: awake, oriented and patient cooperative  Airway and Oxygen Therapy: Patient Spontanous Breathing and Patient connected to face mask oxygen  Post-op Pain: mild  Post-op Assessment: Post-op Vital signs reviewed, Patient's Cardiovascular Status Stable, Respiratory Function Stable, Patent Airway, No signs of Nausea or vomiting and Pain level controlled  Post-op Vital Signs: Reviewed and stable  Last Vitals:  Filed Vitals:   08/24/14 1015  BP: 122/69  Pulse: 87  Temp:   Resp: 15    Complications: No apparent anesthesia complications

## 2014-08-24 NOTE — Progress Notes (Signed)
NT reported noting blood under patient's buttocks, upon observation a small amount of frank red blood noted on pad beneath patient with dried blood noted on patient's skin surrounding anus. OR notified. Will continue to monitor.

## 2014-08-25 ENCOUNTER — Encounter (HOSPITAL_COMMUNITY): Payer: Self-pay | Admitting: General Surgery

## 2014-08-25 LAB — BASIC METABOLIC PANEL
Anion gap: 9 (ref 5–15)
BUN: 11 mg/dL (ref 6–23)
CALCIUM: 8.5 mg/dL (ref 8.4–10.5)
CO2: 21 mmol/L (ref 19–32)
Chloride: 108 mmol/L (ref 96–112)
Creatinine, Ser: 0.87 mg/dL (ref 0.50–1.10)
GFR, EST NON AFRICAN AMERICAN: 80 mL/min — AB (ref >90)
Glucose, Bld: 92 mg/dL (ref 70–99)
Potassium: 3.4 mmol/L — ABNORMAL LOW (ref 3.5–5.1)
Sodium: 138 mmol/L (ref 135–145)

## 2014-08-25 LAB — CBC
HEMATOCRIT: 24.9 % — AB (ref 36.0–46.0)
Hemoglobin: 8.4 g/dL — ABNORMAL LOW (ref 12.0–15.0)
MCH: 30.4 pg (ref 26.0–34.0)
MCHC: 33.7 g/dL (ref 30.0–36.0)
MCV: 90.2 fL (ref 78.0–100.0)
Platelets: 181 10*3/uL (ref 150–400)
RBC: 2.76 MIL/uL — ABNORMAL LOW (ref 3.87–5.11)
RDW: 13.5 % (ref 11.5–15.5)
WBC: 10.3 10*3/uL (ref 4.0–10.5)

## 2014-08-25 LAB — PHOSPHORUS: PHOSPHORUS: 3.3 mg/dL (ref 2.3–4.6)

## 2014-08-25 LAB — HEMOGLOBIN AND HEMATOCRIT, BLOOD
HEMATOCRIT: 25.8 % — AB (ref 36.0–46.0)
Hemoglobin: 9 g/dL — ABNORMAL LOW (ref 12.0–15.0)

## 2014-08-25 LAB — MAGNESIUM: Magnesium: 1.5 mg/dL (ref 1.5–2.5)

## 2014-08-25 MED ORDER — KCL IN DEXTROSE-NACL 20-5-0.45 MEQ/L-%-% IV SOLN
INTRAVENOUS | Status: DC
  Administered 2014-08-25 – 2014-08-29 (×4): via INTRAVENOUS

## 2014-08-25 NOTE — Anesthesia Postprocedure Evaluation (Signed)
  Anesthesia Post-op Note  Patient: Alicia Tyler  Procedure(s) Performed: Procedure(s): TOTAL COLECTOMY (N/A)  Patient Location: Nursing Unit  Anesthesia Type:General  Level of Consciousness: awake, alert  and patient cooperative  Airway and Oxygen Therapy: Patient Spontanous Breathing  Post-op Pain: moderate  Post-op Assessment: Post-op Vital signs reviewed, Patient's Cardiovascular Status Stable, Respiratory Function Stable and Patent Airway  Post-op Vital Signs: Reviewed and stable  Last Vitals:  Filed Vitals:   08/25/14 0841  BP: 88/49  Pulse: 85  Temp: 37.1 C  Resp: 18    Complications: No apparent anesthesia complications. Nausea resolving, emesis x 2 yesterday.

## 2014-08-25 NOTE — Progress Notes (Signed)
Patient ambulated in hallway. Slow steady gait. Tolerated well.

## 2014-08-25 NOTE — Care Management Note (Addendum)
    Page 1 of 1   08/29/2014     9:31:37 AM CARE MANAGEMENT NOTE 08/29/2014  Patient:  Alicia Tyler, Alicia Tyler   Account Number:  [id number]  Date Initiated:  08/25/2014  Documentation initiated by:  Theophilus Kinds  Subjective/Objective Assessment:   Pt admitted from home s/p  colectomy. Pt lives with her husband and will return home at discharge. Pt has been independent with ADL's.     Action/Plan:   No CM needs noted.   Anticipated DC Date:  08/28/2014   Anticipated DC Plan:  [place]  CM consult      Choice offered to / List presented to:             Status of service:  Completed, signed off Medicare Important Message given?   (If response is "NO", the following Medicare IM given date fields will be blank) Date Medicare IM given:   Medicare IM given by:   Date Additional Medicare IM given:   Additional Medicare IM given by:    Discharge Disposition:  HOME/SELF CARE  Per UR Regulation:    If discussed at Long Length of Stay Meetings, dates discussed:    Comments:  08/29/14 [place], RN BSN CM Pt discharged home today. No CM needs noted.  08/26/14 [place], RN BSN CM Pt having BM's  and diet has been advanced. Anticipate discharge over the weekend. No Cm needs noted.  08/25/14 [place], RN BSN CM

## 2014-08-25 NOTE — Progress Notes (Signed)
1 Day Post-Op  Subjective: Having moderate incisional pain. Had 2 episodes of emesis, but currently feels much better.  Objective: Vital signs in last 24 hours: Temp:  [97.3 F (36.3 C)-100.4 F (38 C)] 98.7 F (37.1 C) (03/03 0841) Pulse Rate:  [64-112] 85 (03/03 0841) Resp:  [11-21] 18 (03/03 0841) BP: (94-126)/(48-75) 94/54 mmHg (03/03 0841) SpO2:  [94 %-100 %] 98 % (03/03 0841) Last BM Date: 08/24/14  Intake/Output from previous day: 03/02 0701 - 03/03 0700 In: 3816.7 [P.O.:240; I.V.:3576.7] Out: 1150 [Urine:1000; Blood:150] Intake/Output this shift:    General appearance: alert, cooperative and no distress Resp: clear to auscultation bilaterally Cardio: regular rate and rhythm, S1, S2 normal, no murmur, click, rub or gallop GI: Soft. Incision healing well. Minimal bowel sounds appreciated. No significant distention noted.  Lab Results:   Recent Labs  08/25/14 0617  WBC 10.3  HGB 8.4*  HCT 24.9*  PLT 181   BMET  Recent Labs  08/25/14 0617  NA 138  K 3.4*  CL 108  CO2 21  GLUCOSE 92  BUN 11  CREATININE 0.87  CALCIUM 8.5   PT/INR No results for input(s): LABPROT, INR in the last 72 hours.  Studies/Results: No results found.  Anti-infectives: Anti-infectives    Start     Dose/Rate Route Frequency Ordered Stop   08/24/14 0627  metroNIDAZOLE (FLAGYL) IVPB 500 mg     500 mg 100 mL/hr over 60 Minutes Intravenous On call to O.R. 08/24/14 0263 08/24/14 0818   08/24/14 7858  ciprofloxacin (CIPRO) IVPB 400 mg     400 mg 200 mL/hr over 60 Minutes Intravenous On call to O.R. 08/24/14 8502 08/24/14 0834      Assessment/Plan: s/p Procedure(s): TOTAL COLECTOMY Impression: Stable on postoperative day 1. Patient has had a drop in her hemoglobin secondary to acute surgical blood loss. Will recheck hematocrit later today. Patient has been made aware that she may need a blood transfusion. Will adjust IV fluids.  LOS: 1 day    Levita Monical A 08/25/2014

## 2014-08-25 NOTE — Progress Notes (Signed)
UR chart review completed.  

## 2014-08-25 NOTE — Addendum Note (Signed)
Addendum  created 08/25/14 0843 by Vista Deck, CRNA   Modules edited: Notes Section   Notes Section:  File: [id number]

## 2014-08-26 LAB — BASIC METABOLIC PANEL
Anion gap: 7 (ref 5–15)
BUN: 6 mg/dL (ref 6–23)
CO2: 23 mmol/L (ref 19–32)
Calcium: 8.3 mg/dL — ABNORMAL LOW (ref 8.4–10.5)
Chloride: 103 mmol/L (ref 96–112)
Creatinine, Ser: 0.67 mg/dL (ref 0.50–1.10)
GFR calc Af Amer: >90 mL/min (ref >90)
GFR calc non Af Amer: >90 mL/min (ref >90)
Glucose, Bld: 134 mg/dL — ABNORMAL HIGH (ref 70–99)
Potassium: 3.5 mmol/L (ref 3.5–5.1)
Sodium: 133 mmol/L — ABNORMAL LOW (ref 135–145)

## 2014-08-26 LAB — CBC
HCT: 23.8 % — ABNORMAL LOW (ref 36.0–46.0)
HEMOGLOBIN: 8.3 g/dL — AB (ref 12.0–15.0)
MCH: 31.1 pg (ref 26.0–34.0)
MCHC: 34.9 g/dL (ref 30.0–36.0)
MCV: 89.1 fL (ref 78.0–100.0)
PLATELETS: 158 10*3/uL (ref 150–400)
RBC: 2.67 MIL/uL — ABNORMAL LOW (ref 3.87–5.11)
RDW: 13.3 % (ref 11.5–15.5)
WBC: 8.3 10*3/uL (ref 4.0–10.5)

## 2014-08-26 LAB — PHOSPHORUS: PHOSPHORUS: 1.7 mg/dL — AB (ref 2.3–4.6)

## 2014-08-26 LAB — MAGNESIUM: MAGNESIUM: 1.5 mg/dL (ref 1.5–2.5)

## 2014-08-26 MED ORDER — PANTOPRAZOLE SODIUM 40 MG PO TBEC
40.0000 mg | DELAYED_RELEASE_TABLET | Freq: Every day | ORAL | Status: DC
  Administered 2014-08-26 – 2014-08-28 (×3): 40 mg via ORAL
  Filled 2014-08-26 (×3): qty 1

## 2014-08-26 MED ORDER — POTASSIUM PHOSPHATES 15 MMOLE/5ML IV SOLN
20.0000 mmol | Freq: Once | INTRAVENOUS | Status: AC
  Administered 2014-08-26: 20 mmol via INTRAVENOUS
  Filled 2014-08-26: qty 6.67

## 2014-08-26 MED ORDER — ALUM & MAG HYDROXIDE-SIMETH 200-200-20 MG/5ML PO SUSP
30.0000 mL | ORAL | Status: DC | PRN
  Administered 2014-08-26 – 2014-08-27 (×3): 30 mL via ORAL
  Filled 2014-08-26 (×3): qty 30

## 2014-08-26 NOTE — Progress Notes (Signed)
Patient has had 3 episodes of passing dark maroon blood, mucous in texture while passing gas.

## 2014-08-26 NOTE — Progress Notes (Signed)
2 Days Post-Op  Subjective: Is having flatus and passing some blood in her stools. Still with nausea secondary to history of reflux. No emesis noted.  Objective: Vital signs in last 24 hours: Temp:  [98.7 F (37.1 C)-98.9 F (37.2 C)] 98.9 F (37.2 C) (03/04 0629) Pulse Rate:  [78-87] 87 (03/04 0629) Resp:  [18] 18 (03/04 0629) BP: (103-117)/(64-68) 117/68 mmHg (03/04 0629) SpO2:  [97 %-100 %] 99 % (03/04 0629) Last BM Date: 08/24/14  Intake/Output from previous day: 03/03 0701 - 03/04 0700 In: 2655.8 [P.O.:720; I.V.:1935.8] Out: 1350 [Urine:1350] Intake/Output this shift:    General appearance: alert, cooperative and no distress Resp: clear to auscultation bilaterally Cardio: regular rate and rhythm, S1, S2 normal, no murmur, click, rub or gallop GI: Soft, incision healing well. Bowel sounds appreciated. No significant distention noted.  Lab Results:   Recent Labs  08/25/14 0617 08/25/14 1354 08/26/14 0627  WBC 10.3  --  8.3  HGB 8.4* 9.0* 8.3*  HCT 24.9* 25.8* 23.8*  PLT 181  --  158   BMET  Recent Labs  08/25/14 0617 08/26/14 0627  NA 138 133*  K 3.4* 3.5  CL 108 103  CO2 21 23  GLUCOSE 92 134*  BUN 11 6  CREATININE 0.87 0.67  CALCIUM 8.5 8.3*   PT/INR No results for input(s): LABPROT, INR in the last 72 hours.  Studies/Results: No results found.  Anti-infectives: Anti-infectives    Start     Dose/Rate Route Frequency Ordered Stop   08/24/14 0627  metroNIDAZOLE (FLAGYL) IVPB 500 mg     500 mg 100 mL/hr over 60 Minutes Intravenous On call to O.R. 08/24/14 5809 08/24/14 0818   08/24/14 9833  ciprofloxacin (CIPRO) IVPB 400 mg     400 mg 200 mL/hr over 60 Minutes Intravenous On call to O.R. 08/24/14 8250 08/24/14 0834      Assessment/Plan: s/p Procedure(s): TOTAL COLECTOMY Impression: Bowel function starting to return. Her hemoglobin is stable. She does have hypophosphatemia and this will be addressed. We'll advance to a regular diet. She has  been started on Protonix and Maalox.  LOS: 2 days    Alicia Tyler A 08/26/2014

## 2014-08-26 NOTE — Progress Notes (Signed)
Pt passing gas. Noted blood to rectal area.

## 2014-08-26 NOTE — Progress Notes (Signed)
MEDICATION RELATED CONSULT NOTE - INITIAL   Pharmacy Consult for Phos replacement Indication: hypophosphatemia  Allergies  Allergen Reactions  . Bee Venom     anaphylaxis  . Beesix [Pyridoxine Hcl] Anaphylaxis  . Penicillins Other (See Comments)    Makes hair fall out  . Aspirin Swelling and Rash  . Codeine Nausea And Vomiting and Rash    Patient Measurements:    Vital Signs: Temp: 98.9 F (37.2 C) (03/04 0629) Temp Source: Oral (03/04 0629) BP: 117/68 mmHg (03/04 0629) Pulse Rate: 87 (03/04 0629) Intake/Output from previous day: 03/03 0701 - 03/04 0700 In: 2655.8 [P.O.:720; I.V.:1935.8] Out: 1350 [Urine:1350] Intake/Output from this shift:    Labs:  Recent Labs  08/25/14 0617 08/25/14 1354 08/26/14 0627  WBC 10.3  --  8.3  HGB 8.4* 9.0* 8.3*  HCT 24.9* 25.8* 23.8*  PLT 181  --  158  CREATININE 0.87  --  0.67  MG 1.5  --  1.5  PHOS 3.3  --  1.7*   Estimated Creatinine Clearance: 81.6 mL/min (by C-G formula based on Cr of 0.67).   Microbiology: No results found for this or any previous visit (from the past 720 hour(s)).  Medical History: Past Medical History  Diagnosis Date  . Seasonal allergies   . BMI (body mass index) 20.0-29.01 Apr 2010 172 LBS  . 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 Headache(784.0)   . Anxiety   . PONV (postoperative nausea and vomiting)   . Chronic back pain   . Neuropathy   . Degenerative disc disease, thoracic     Medications:  Scheduled:  . alvimopan  12 mg Oral BID  . cycloSPORINE  1 drop Both Eyes BID  . enoxaparin (LOVENOX) injection  40 mg Subcutaneous Q24H  . gabapentin  300 mg Oral TID  . loteprednol  1 drop Both Eyes BID  . olopatadine  1 drop Both Eyes BID  . pantoprazole  40 mg Oral Q1200  . potassium phosphate IVPB (mmol)  20 mmol Intravenous Once  . topiramate  50 mg Oral BID    Assessment: 44 yo F s/p total colectomy.  Phos level low today.  Potassium at low end of goal range.  He is also getting  13mEq potassium in IVF bag (~86mEq/day).  Goal of Therapy:  Phosphorus 2.3-4.6  Potassium 3.5-5.1  Plan:  KPhos 55mMol IV x1 now F/U Bmet, Phos in am  Biagio Borg 08/26/2014,9:39 AM

## 2014-08-26 NOTE — Progress Notes (Signed)
0945 Patient ambulated x 1 lap around nurses station this shift. Tolerated well.

## 2014-08-27 LAB — BASIC METABOLIC PANEL
Anion gap: 5 (ref 5–15)
CALCIUM: 8.5 mg/dL (ref 8.4–10.5)
CO2: 27 mmol/L (ref 19–32)
Chloride: 106 mmol/L (ref 96–112)
Creatinine, Ser: 0.73 mg/dL (ref 0.50–1.10)
GFR calc Af Amer: >90 mL/min (ref >90)
GFR calc non Af Amer: >90 mL/min (ref >90)
GLUCOSE: 115 mg/dL — AB (ref 70–99)
POTASSIUM: 3.4 mmol/L — AB (ref 3.5–5.1)
Sodium: 138 mmol/L (ref 135–145)

## 2014-08-27 LAB — CBC
HEMATOCRIT: 25.5 % — AB (ref 36.0–46.0)
Hemoglobin: 8.7 g/dL — ABNORMAL LOW (ref 12.0–15.0)
MCH: 30.5 pg (ref 26.0–34.0)
MCHC: 34.1 g/dL (ref 30.0–36.0)
MCV: 89.5 fL (ref 78.0–100.0)
PLATELETS: 191 10*3/uL (ref 150–400)
RBC: 2.85 MIL/uL — AB (ref 3.87–5.11)
RDW: 13.3 % (ref 11.5–15.5)
WBC: 8.5 10*3/uL (ref 4.0–10.5)

## 2014-08-27 LAB — PHOSPHORUS: Phosphorus: 2.5 mg/dL (ref 2.3–4.6)

## 2014-08-27 NOTE — Progress Notes (Signed)
Patient ambulated lap around nurses station x1.  Tolerated well.  Pain meds given after ambulation.  Will continue to monitor patient.

## 2014-08-27 NOTE — Progress Notes (Signed)
[place] for Phos replacement Indication: hypophosphatemia  Allergies  Allergen Reactions  . Bee Venom     anaphylaxis  . Beesix [Pyridoxine Hcl] Anaphylaxis  . Penicillins Other (See Comments)    Makes hair fall out  . Aspirin Swelling and Rash  . Codeine Nausea And Vomiting and Rash    Patient Measurements:    Vital Signs: Temp: 97.5 F (36.4 C) (03/05 0651) Temp Source: Oral (03/05 0651) BP: 110/69 mmHg (03/05 0651) Pulse Rate: 79 (03/05 0651) Intake/Output from previous day: 03/04 0701 - 03/05 0700 In: 720 [P.O.:720] Out: 2100 [Urine:2100] Intake/Output from this shift:    Labs:  Recent Labs  08/25/14 0617 08/25/14 1354 08/26/14 0627 08/27/14 0647  WBC 10.3  --  8.3 8.5  HGB 8.4* 9.0* 8.3* 8.7*  HCT 24.9* 25.8* 23.8* 25.5*  PLT 181  --  158 191  CREATININE 0.87  --  0.67 0.73  MG 1.5  --  1.5  --   PHOS 3.3  --  1.7* 2.5   Estimated Creatinine Clearance: 81.6 mL/min (by C-G formula based on Cr of 0.73).   Microbiology: No results found for this or any previous visit (from the past 720 hour(s)).  Medical History: Past Medical History  Diagnosis Date  . Seasonal allergies   . BMI (body mass index) 20.0-29.01 Apr 2010 172 LBS  . 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 Headache(784.0)   . Anxiety   . PONV (postoperative nausea and vomiting)   . Chronic back pain   . Neuropathy   . Degenerative disc disease, thoracic     Medications:  Scheduled:  . alvimopan  12 mg Oral BID  . cycloSPORINE  1 drop Both Eyes BID  . enoxaparin (LOVENOX) injection  40 mg Subcutaneous Q24H  . gabapentin  300 mg Oral TID  . loteprednol  1 drop Both Eyes BID  . olopatadine  1 drop Both Eyes BID  . pantoprazole  40 mg Oral Q1200  . topiramate  50 mg Oral BID    Assessment: 44 yo F s/p total colectomy.  Phos level at goal today after repletion.   Goal of Therapy:  Phosphorus 2.3-4.6  Potassium 3.5-5.1  Plan:  Pharmacy  to sign off. Please re-consult as needed.    Biagio Borg 08/27/2014,11:30 AM

## 2014-08-27 NOTE — Progress Notes (Signed)
3 Days Post-Op  Subjective: Is passing gas and had bowel movement. Started on regular diet this morning.  Objective: Vital signs in last 24 hours: Temp:  [97 F (36.1 C)-98.9 F (37.2 C)] 97.5 F (36.4 C) (03/05 0651) Pulse Rate:  [75-79] 79 (03/05 0651) Resp:  [16-18] 16 (03/05 0651) BP: (100-110)/(65-69) 110/69 mmHg (03/05 0651) SpO2:  [100 %] 100 % (03/05 0651) Last BM Date: 08/26/14  Intake/Output from previous day: 03/04 0701 - 03/05 0700 In: 720 [P.O.:720] Out: 2100 [Urine:2100] Intake/Output this shift:    General appearance: alert, cooperative and no distress Resp: clear to auscultation bilaterally Cardio: regular rate and rhythm, S1, S2 normal, no murmur, click, rub or gallop GI: Soft. Incision healing well. Bowel sounds active.  Lab Results:   Recent Labs  08/26/14 0627 08/27/14 0647  WBC 8.3 8.5  HGB 8.3* 8.7*  HCT 23.8* 25.5*  PLT 158 191   BMET  Recent Labs  08/26/14 0627 08/27/14 0647  NA 133* 138  K 3.5 3.4*  CL 103 106  CO2 23 27  GLUCOSE 134* 115*  BUN 6 <5*  CREATININE 0.67 0.73  CALCIUM 8.3* 8.5   PT/INR No results for input(s): LABPROT, INR in the last 72 hours.  Studies/Results: No results found.  Anti-infectives: Anti-infectives    Start     Dose/Rate Route Frequency Ordered Stop   08/24/14 0627  metroNIDAZOLE (FLAGYL) IVPB 500 mg     500 mg 100 mL/hr over 60 Minutes Intravenous On call to O.R. 08/24/14 4081 08/24/14 0818   08/24/14 4481  ciprofloxacin (CIPRO) IVPB 400 mg     400 mg 200 mL/hr over 60 Minutes Intravenous On call to O.R. 08/24/14 8563 08/24/14 0834      Assessment/Plan: s/p Procedure(s): TOTAL COLECTOMY Impression: Bowel function is returning. Patient feels full when she initially eats. She does have a history of gastroparesis. Hemoglobin stable. Diet has been advanced to regular diet. Anticipate discharge in next 24-48 hours.  LOS: 3 days    Jamiya Nims A 08/27/2014

## 2014-08-28 LAB — BASIC METABOLIC PANEL
ANION GAP: 4 — AB (ref 5–15)
BUN: 7 mg/dL (ref 6–23)
CHLORIDE: 111 mmol/L (ref 96–112)
CO2: 25 mmol/L (ref 19–32)
Calcium: 8.1 mg/dL — ABNORMAL LOW (ref 8.4–10.5)
Creatinine, Ser: 0.7 mg/dL (ref 0.50–1.10)
GLUCOSE: 94 mg/dL (ref 70–99)
POTASSIUM: 3.7 mmol/L (ref 3.5–5.1)
Sodium: 140 mmol/L (ref 135–145)

## 2014-08-28 LAB — TYPE AND SCREEN
ABO/RH(D): A POS
ANTIBODY SCREEN: NEGATIVE
UNIT DIVISION: 0
Unit division: 0

## 2014-08-28 LAB — CBC
HCT: 22.9 % — ABNORMAL LOW (ref 36.0–46.0)
Hemoglobin: 7.8 g/dL — ABNORMAL LOW (ref 12.0–15.0)
MCH: 31 pg (ref 26.0–34.0)
MCHC: 34.1 g/dL (ref 30.0–36.0)
MCV: 90.9 fL (ref 78.0–100.0)
Platelets: 207 10*3/uL (ref 150–400)
RBC: 2.52 MIL/uL — ABNORMAL LOW (ref 3.87–5.11)
RDW: 13.5 % (ref 11.5–15.5)
WBC: 7.7 10*3/uL (ref 4.0–10.5)

## 2014-08-28 LAB — PREPARE RBC (CROSSMATCH)

## 2014-08-28 MED ORDER — SODIUM CHLORIDE 0.9 % IV SOLN
Freq: Once | INTRAVENOUS | Status: AC
  Administered 2014-08-28: 18:00:00 via INTRAVENOUS

## 2014-08-28 MED ORDER — FERROUS FUMARATE 325 (106 FE) MG PO TABS
1.0000 | ORAL_TABLET | Freq: Two times a day (BID) | ORAL | Status: DC
Start: 2014-08-28 — End: 2014-08-29
  Administered 2014-08-28 – 2014-08-29 (×3): 106 mg via ORAL
  Filled 2014-08-28 (×3): qty 1

## 2014-08-28 MED ORDER — OXYCODONE HCL ER 15 MG PO T12A
15.0000 mg | EXTENDED_RELEASE_TABLET | Freq: Two times a day (BID) | ORAL | Status: DC
  Administered 2014-08-28 – 2014-08-29 (×3): 15 mg via ORAL
  Filled 2014-08-28 (×3): qty 1

## 2014-08-28 NOTE — Progress Notes (Signed)
Patient ambulated  x 2 with assistance two laps on floor around nurses station

## 2014-08-28 NOTE — Progress Notes (Signed)
4 Days Post-Op  Subjective: Still having intermittent incisional pain. Is intermittently passing dark blood per rectum. She is having bowel movements.  Objective: Vital signs in last 24 hours: Temp:  [97.3 F (36.3 C)-98 F (36.7 C)] 97.3 F (36.3 C) (03/06 0620) Pulse Rate:  [80-84] 84 (03/06 0620) Resp:  [16-18] 16 (03/06 0620) BP: (107-111)/(66-72) 111/72 mmHg (03/06 0620) SpO2:  [100 %] 100 % (03/06 0620) Last BM Date: 08/27/14  Intake/Output from previous day: 03/05 0701 - 03/06 0700 In: 2788.3 [P.O.:240; I.V.:2548.3] Out: 1150 [Urine:1150] Intake/Output this shift:    General appearance: alert, cooperative and fatigued Resp: clear to auscultation bilaterally Cardio: regular rate and rhythm, S1, S2 normal, no murmur, click, rub or gallop GI: Soft, nondistended. Incision healing well. Bowel sounds active.  Lab Results:   Recent Labs  08/27/14 0647 08/28/14 0626  WBC 8.5 7.7  HGB 8.7* 7.8*  HCT 25.5* 22.9*  PLT 191 207   BMET  Recent Labs  08/27/14 0647 08/28/14 0626  NA 138 140  K 3.4* 3.7  CL 106 111  CO2 27 25  GLUCOSE 115* 94  BUN <5* 7  CREATININE 0.73 0.70  CALCIUM 8.5 8.1*   PT/INR No results for input(s): LABPROT, INR in the last 72 hours.  Studies/Results: No results found.  Anti-infectives: Anti-infectives    Start     Dose/Rate Route Frequency Ordered Stop   08/24/14 0627  metroNIDAZOLE (FLAGYL) IVPB 500 mg     500 mg 100 mL/hr over 60 Minutes Intravenous On call to O.R. 08/24/14 0165 08/24/14 0818   08/24/14 5374  ciprofloxacin (CIPRO) IVPB 400 mg     400 mg 200 mL/hr over 60 Minutes Intravenous On call to O.R. 08/24/14 8270 08/24/14 0834      Assessment/Plan: s/p Procedure(s): TOTAL COLECTOMY Impression: Postoperative day 4, status post total colectomy. Patient's hemoglobin remains low. Patient is fatigued, thus we'll give 2 units of packed red blood cells. The blood per rectum is probably from anastomotic bleeding which  should resolve. Will stop Lovenox at the present time.  LOS: 4 days    Alma Mohiuddin A 08/28/2014

## 2014-08-28 NOTE — Progress Notes (Signed)
Patient ambulated with assistance 1 lap around the nurses station.  Tolerated well.

## 2014-08-28 NOTE — Progress Notes (Signed)
Dr. Arnoldo Morale notified that patient having increased pain and bloody liquid stools.  Will continue to monitor patient.

## 2014-08-29 LAB — TYPE AND SCREEN
ABO/RH(D): A POS
Antibody Screen: NEGATIVE
UNIT DIVISION: 0
Unit division: 0

## 2014-08-29 LAB — CBC
HCT: 31.5 % — ABNORMAL LOW (ref 36.0–46.0)
Hemoglobin: 10.5 g/dL — ABNORMAL LOW (ref 12.0–15.0)
MCH: 29.7 pg (ref 26.0–34.0)
MCHC: 33.3 g/dL (ref 30.0–36.0)
MCV: 89 fL (ref 78.0–100.0)
PLATELETS: 222 10*3/uL (ref 150–400)
RBC: 3.54 MIL/uL — ABNORMAL LOW (ref 3.87–5.11)
RDW: 14.2 % (ref 11.5–15.5)
WBC: 7.8 10*3/uL (ref 4.0–10.5)

## 2014-08-29 LAB — BASIC METABOLIC PANEL
Anion gap: 6 (ref 5–15)
BUN: 7 mg/dL (ref 6–23)
CO2: 25 mmol/L (ref 19–32)
Calcium: 8.6 mg/dL (ref 8.4–10.5)
Chloride: 110 mmol/L (ref 96–112)
Creatinine, Ser: 0.76 mg/dL (ref 0.50–1.10)
GFR calc Af Amer: >90 mL/min (ref >90)
GFR calc non Af Amer: >90 mL/min (ref >90)
Glucose, Bld: 105 mg/dL — ABNORMAL HIGH (ref 70–99)
POTASSIUM: 3.5 mmol/L (ref 3.5–5.1)
Sodium: 141 mmol/L (ref 135–145)

## 2014-08-29 MED ORDER — OXYCODONE HCL ER 15 MG PO T12A: 15.0000 mg | EXTENDED_RELEASE_TABLET | Freq: Two times a day (BID) | ORAL | Status: DC

## 2014-08-29 MED ORDER — DIAZEPAM 10 MG PO TABS: 10.0000 mg | ORAL_TABLET | Freq: Three times a day (TID) | ORAL | Status: DC | PRN

## 2014-08-29 NOTE — Clinical Documentation Improvement (Signed)
Noted 08/28/14 Progr Note..."s/p Procedure(s): TOTAL COLECTOMY Impression: Postoperative day 4, status post total colectomy. Patient's hemoglobin remains low. Patient is fatigued, thus we'll give 2 units of packed red blood cells. The blood per rectum is probably from anastomotic bleeding which should resolve. Will stop Lovenox at the present time.".Marland Kitchen. 08/27/14 08/28/14 HGB 8.7*     7.8*  For accurate Dx specific & severity can noted condition with above tx be further specified for cond being eval'd, mon'd & tx'd.  Thank you  . Documentation of Anemia should include the type of anemia: --Nutritional --Hemolytic --Aplastic --Due to blood loss> --Acute Blood Loss Anemia --Chronic Blood Loss Anemia -Postoperative Blood Loss Anemia --Other (please specify) -Unable to determine  Supporting Information: See above note  Thank You, Ermelinda Das, RN, BSN, CCDS Certified Clinical Documentation Specialist Pager: [place]: Health Information Management

## 2014-08-29 NOTE — Discharge Summary (Signed)
Physician Discharge Summary  Patient ID: Aianna Fahs MRN: [id number] DOB/AGE: [birth year] 44 y.o.  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 This was performed on 08/24/2014. She tolerated the procedure well. Postoperative course was remarkable for anemia secondary to surgical blood loss. She did have some blood per rectum most likely secondary to her anastomosis. She ultimately received 2 units packed red blood cells. Her hemoglobin has been stable. She has had a bowel movement without blood in it. She is being discharged home on 08/29/2014 in good improving condition.  Treatments: surgery: Total colectomy on 08/24/2014  Discharge Exam: Blood pressure 94/60, pulse 76, temperature 98.3 F (36.8 C), temperature source Oral, resp. rate 16, last menstrual period 07/22/2011, SpO2 97 %. General appearance: alert, cooperative, fatigued and no distress Resp: clear to auscultation bilaterally Cardio: regular rate and rhythm, S1, S2 normal, no murmur, click, rub or gallop GI: Soft. Incision healing well. Bowel sounds active.  Disposition: 01-Home or Self Care     Medication List    TAKE these medications        ALPRAZolam 0.5 MG tablet  Commonly known as:  XANAX  Take 0.5 mg by mouth daily.     ALREX 0.2 % Susp  Generic drug:  loteprednol  Place 1 drop into both eyes 2 (two) times daily.     AMITIZA 24 MCG capsule  Generic drug:  lubiprostone  Take 1 capsule (24 mcg total) by mouth 2 (two) times daily with a meal.     Biotin 5000 MCG Caps  Take 5,000 mcg by mouth 2 (two) times daily.     cycloSPORINE 0.05 % ophthalmic emulsion   Commonly known as:  RESTASIS  Place 1 drop into both eyes 2 (two) times daily.     diazepam 10 MG tablet  Commonly known as:  VALIUM  Take 1 tablet (10 mg total) by mouth every 8 (eight) hours as needed (muscle spasms).     dicyclomine 10 MG capsule  Commonly known as:  BENTYL  1 po AT 8 AM, 12 NOON, AND 4 PM FOR 3 DAYS THEN 1 PO AT 8 AM AND 4 PM FOR 4 DAYS THEN USE AS NEEDED FOR ABDOMINAL PAIN/DIARRHEA.     DOCQLACE 100 MG capsule  Generic drug:  docusate sodium  TAKE 1 CAPSULE BY MOUTH THREE TIMES DAILY     FIBER SELECT GUMMIES PO  Take 2 tablets by mouth 2 (two) times daily.     furosemide 20 MG tablet  Commonly known as:  LASIX  Take 20 mg by mouth 2 (two) times a week. Takes on Mondays and Fridays     gabapentin 300 MG capsule  Commonly known as:  NEURONTIN  Take 300 mg by mouth 3 (three) times daily.     LINZESS 290 MCG Caps capsule  Generic drug:  Linaclotide  TAKE 1 CAPSULE BY MOUTH EVERY DAY 30 MINUTES PRIOR TO BREAKFAST     loratadine 10 MG tablet  Commonly known as:  CLARITIN  Take 10 mg by mouth daily.     meloxicam 15 MG tablet  Commonly known as:  MOBIC  Take 15 mg by mouth daily.     Olopatadine HCl 0.2 % Soln  Place 1 drop into both eyes 2 (two) times daily.     oxyCODONE 15 MG immediate release tablet  Commonly known as:  ROXICODONE  Take 15 mg by mouth every 4 (four) hours as needed.     OxyCODONE 15 mg T12a 12 hr tablet  Commonly known as:  OXYCONTIN  Take 1 tablet (15 mg total) by mouth every 12 (twelve) hours.     senna 8.6 MG tablet  Commonly known as:  SENOKOT  Take 4 tablets by mouth daily.     SUMAtriptan 100 MG tablet  Commonly known as:  IMITREX  Take 100 mg by mouth every 2 (two) hours as needed for migraine or headache. May repeat in 2 hours if headache persists or recurs.     topiramate 50 MG tablet  Commonly known as:  TOPAMAX  Take 50 mg by mouth 2 (two) times daily.     Vitamin D 2000 UNITS tablet  Take 2,000 Units by  mouth daily.           Follow-up Information    Follow up with Jamesetta So, MD. Schedule an appointment as soon as possible for a visit on 09/06/2014.   Specialty:  General Surgery   Contact information:   [address] [place] [address] [phone]       Signed: Aviva Signs A 08/29/2014, 9:04 AM

## 2014-08-29 NOTE — Progress Notes (Signed)
UR chart review completed.  

## 2014-08-29 NOTE — Discharge Instructions (Signed)
Open Colectomy, Care After Refer to this sheet in the next few weeks. These instructions provide you with information on caring for yourself after your procedure. Your health care provider may also give you more specific instructions. Your treatment has been planned according to current medical practices, but problems sometimes occur. Call your health care provider if you have any problems or questions after your procedure. WHAT TO EXPECT AFTER THE PROCEDURE After your procedure, it is typical to have the following:  Pain in your abdomen, especially along your incision. You will be given medicines to control the pain.  Tiredness. This is a normal part of the recovery process. Your energy level will return to normal over the next several weeks.  Constipation. You may be given a stool softener to prevent this. HOME CARE INSTRUCTIONS  Only take over-the-counter or prescription medicines as directed by your health care provider.  Ask your health care provider whether you may take a shower when you go home.  You may resume a normal diet and activities as directed. Eat plenty of fruits and vegetables to help prevent constipation.  Drink enough fluids to keep your urine clear or pale yellow. This also helps prevent constipation.  Take rest breaks during the day as needed.  Avoid lifting anything heavier than 25 pounds (11.3 kg) or driving for 4 weeks or until your health care provider says it is okay.  Follow up with your health care provider as directed. Ask your health care provider when you need to return to have your stitches or staples removed. SEEK MEDICAL CARE IF:  You have redness, swelling, or increasing pain in the incision area.  You see pus coming from the incision area.  You have a fever. SEEK IMMEDIATE MEDICAL CARE IF:   You have chest pain or shortness of breath.  You have pain or swelling in your legs.  You have persistent nausea and vomiting.  Your wound breaks open  after stitches or staples have been removed.  You have increasing abdominal pain that is not relieved with medicine. Document Released: 01/01/2011 Document Revised: 03/31/2013 Document Reviewed: 01/20/2013 [facility] Patient Information 2015 [place], [place]. This information is not intended to replace advice given to you by your health care provider. Make sure you discuss any questions you have with your health care provider.

## 2014-08-29 NOTE — Progress Notes (Signed)
AVS reviewed with patient, states understanding of discharge instructions, medications, and follow up appointments. Patient stable at time of discharge.

## 2014-09-02 ENCOUNTER — Inpatient Hospital Stay (HOSPITAL_COMMUNITY)
Admission: EM | Admit: 2014-09-02 | Discharge: 2014-09-08 | DRG: 683 | Disposition: A | Discharge status: Home / Self Care | Payer: Medicaid Other | Attending: General Surgery | Admitting: General Surgery

## 2014-09-02 ENCOUNTER — Emergency Department (HOSPITAL_COMMUNITY): Payer: Medicaid Other

## 2014-09-02 ENCOUNTER — Encounter (HOSPITAL_COMMUNITY): Payer: Self-pay | Admitting: *Deleted

## 2014-09-02 DIAGNOSIS — F17210 Nicotine dependence, cigarettes, uncomplicated: Secondary | ICD-10-CM | POA: Diagnosis present

## 2014-09-02 DIAGNOSIS — R112 Nausea with vomiting, unspecified: Secondary | ICD-10-CM

## 2014-09-02 DIAGNOSIS — N289 Disorder of kidney and ureter, unspecified: Secondary | ICD-10-CM

## 2014-09-02 DIAGNOSIS — K668 Other specified disorders of peritoneum: Secondary | ICD-10-CM | POA: Insufficient documentation

## 2014-09-02 DIAGNOSIS — Z9049 Acquired absence of other specified parts of digestive tract: Secondary | ICD-10-CM | POA: Diagnosis present

## 2014-09-02 DIAGNOSIS — N179 Acute kidney failure, unspecified: Principal | ICD-10-CM | POA: Diagnosis present

## 2014-09-02 DIAGNOSIS — R1084 Generalized abdominal pain: Secondary | ICD-10-CM

## 2014-09-02 DIAGNOSIS — E860 Dehydration: Secondary | ICD-10-CM | POA: Diagnosis present

## 2014-09-02 DIAGNOSIS — K3184 Gastroparesis: Secondary | ICD-10-CM | POA: Diagnosis present

## 2014-09-02 DIAGNOSIS — N390 Urinary tract infection, site not specified: Secondary | ICD-10-CM

## 2014-09-02 DIAGNOSIS — Z803 Family history of malignant neoplasm of breast: Secondary | ICD-10-CM

## 2014-09-02 DIAGNOSIS — E876 Hypokalemia: Secondary | ICD-10-CM | POA: Diagnosis present

## 2014-09-02 DIAGNOSIS — K529 Noninfective gastroenteritis and colitis, unspecified: Secondary | ICD-10-CM | POA: Diagnosis present

## 2014-09-02 DIAGNOSIS — R197 Diarrhea, unspecified: Secondary | ICD-10-CM

## 2014-09-02 LAB — CBC WITH DIFFERENTIAL/PLATELET
BASOS ABS: 0.1 10*3/uL (ref 0.0–0.1)
Basophils Relative: 1 % (ref 0–1)
Eosinophils Absolute: 0 10*3/uL (ref 0.0–0.7)
Eosinophils Relative: 0 % (ref 0–5)
HCT: 48.5 % — ABNORMAL HIGH (ref 36.0–46.0)
Hemoglobin: 17.2 g/dL — ABNORMAL HIGH (ref 12.0–15.0)
LYMPHS ABS: 1.6 10*3/uL (ref 0.7–4.0)
Lymphocytes Relative: 15 % (ref 12–46)
MCH: 31.7 pg (ref 26.0–34.0)
MCHC: 35.5 g/dL (ref 30.0–36.0)
MCV: 89.5 fL (ref 78.0–100.0)
Monocytes Absolute: 1.1 10*3/uL — ABNORMAL HIGH (ref 0.1–1.0)
Monocytes Relative: 11 % (ref 3–12)
NEUTROS ABS: 7.4 10*3/uL (ref 1.7–7.7)
NEUTROS PCT: 73 % (ref 43–77)
Platelets: 585 10*3/uL — ABNORMAL HIGH (ref 150–400)
RBC: 5.42 MIL/uL — AB (ref 3.87–5.11)
RDW: 13.7 % (ref 11.5–15.5)
WBC: 10.1 10*3/uL (ref 4.0–10.5)

## 2014-09-02 LAB — BASIC METABOLIC PANEL
ANION GAP: 17 — AB (ref 5–15)
BUN: 25 mg/dL — ABNORMAL HIGH (ref 6–23)
CHLORIDE: 97 mmol/L (ref 96–112)
CO2: 23 mmol/L (ref 19–32)
Calcium: 10.8 mg/dL — ABNORMAL HIGH (ref 8.4–10.5)
Creatinine, Ser: 1.41 mg/dL — ABNORMAL HIGH (ref 0.50–1.10)
GFR calc non Af Amer: 45 mL/min — ABNORMAL LOW (ref >90)
GFR, EST AFRICAN AMERICAN: 52 mL/min — AB (ref >90)
Glucose, Bld: 114 mg/dL — ABNORMAL HIGH (ref 70–99)
POTASSIUM: 4.1 mmol/L (ref 3.5–5.1)
Sodium: 137 mmol/L (ref 135–145)

## 2014-09-02 LAB — LACTIC ACID, PLASMA: Lactic Acid, Venous: 2 mmol/L (ref 0.5–2.0)

## 2014-09-02 MED ORDER — SODIUM CHLORIDE 0.9 % IV SOLN
1000.0000 mL | Freq: Once | INTRAVENOUS | Status: AC
  Administered 2014-09-03: 1000 mL via INTRAVENOUS

## 2014-09-02 MED ORDER — DIPHENHYDRAMINE HCL 50 MG/ML IJ SOLN
25.0000 mg | Freq: Once | INTRAMUSCULAR | Status: AC
  Administered 2014-09-03: 25 mg via INTRAVENOUS
  Filled 2014-09-02: qty 1

## 2014-09-02 MED ORDER — SODIUM CHLORIDE 0.9 % IV SOLN
1000.0000 mL | INTRAVENOUS | Status: DC
  Administered 2014-09-03: 1000 mL via INTRAVENOUS

## 2014-09-02 MED ORDER — FENTANYL CITRATE 0.05 MG/ML IJ SOLN
50.0000 ug | Freq: Once | INTRAMUSCULAR | Status: AC
  Administered 2014-09-03: 50 ug via INTRAVENOUS
  Filled 2014-09-02: qty 2

## 2014-09-02 MED ORDER — METOCLOPRAMIDE HCL 5 MG/ML IJ SOLN
10.0000 mg | Freq: Once | INTRAMUSCULAR | Status: AC
  Administered 2014-09-03: 10 mg via INTRAVENOUS
  Filled 2014-09-02: qty 2

## 2014-09-02 NOTE — ED Notes (Addendum)
Pt had a 80% cholectomy on March 2nd and came home March 7th. Pt began vomiting around 2:30 am on March 10th and has been vomiting almost every 2 hrs. Pt states Dr.Jenkins was not available today, he was in surgery all day. Pt states she can't keep her pain meds down due to the vomiting. Pt unable to keep anything down. Gas X with no relief.

## 2014-09-02 NOTE — ED Provider Notes (Signed)
CSN: [id number]     Arrival date & time 09/02/14  2159 History  This chart was scribed for Alicia Porter, MD by Chester Holstein, ED Scribe. This patient was seen in room APA08/APA08 and the patient's care was started at 11:36 PM.    No chief complaint on file.   The history is provided by the patient and a significant other. No language interpreter was used.   HPI Comments: Alicia Tyler is a 44 y.o. female who presents to the Emergency Department complaining of post-op vomiting as frequent as every  30 minutes to 2 hours with onset yesterday morning about 2:30 am. Pt is s/p 80% colectomy on 03/02 and discharged 03/07. Pt had a blood transfusion on 03/09. Pt notes associated nausea, dizziness, weakness, diarrhea  and feeling gassy and bloated. Pt unable to keep food down and tolerate PO intake. Pt's significant other notes pt's stool color changed from red to black to green to yellow since discharge.  Pt smokes 1.5 ppd and infrequent EtOH use. Pt denies fever and chills.   Pt's PCP is Dr. Manuella Ghazi.  Surgery  Dr Arnoldo Morale   Past Medical History  Diagnosis Date  . Seasonal allergies   . BMI (body mass index) 20.0-29.01 Apr 2010 172 LBS  . 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 Headache(784.0)   . Anxiety   . PONV (postoperative nausea and vomiting)   . Chronic back pain   . Neuropathy   . Degenerative disc disease, thoracic    Past Surgical History  Procedure Laterality Date  . Tubal ligation    . Ovarian cyst removal    . Breast lumpectomy      from the left breast  . Colonoscopy  NOV 2011 SCREENING     HYPERPLASTIC RECTAL POLYP, SML IH, incomplete due to discomfort  . Colonoscopy  DEC 2011 w/ PROPOFOL    SERRATED ADENOMA, HYPERPLASTIC POLY[S[  . Knee surgery Bilateral     2 on righ-1 scope and one open; and one on left  . Ablation      endometrial ablation  . Mouth surgery  07/21/2013  . Smart pill procedure N/A 07/04/2014    Procedure: SMART PILL PROCEDURE;  Surgeon: Danie Binder, MD;   Location: [facility];  Service: Endoscopy;  Laterality: N/A;  800  . Back surgery  05/14/2011    Lumbar Fusion and cage  . Colectomy N/A 08/24/2014    Procedure: TOTAL COLECTOMY;  Surgeon: Jamesetta So, MD;  Location: [facility];  Service: General;  Laterality: N/A;   Family History  Problem Relation Age of Onset  . Breast cancer Maternal Aunt   . Colon cancer Neg Hx   . Colon polyps Neg Hx    History  Substance Use Topics  . Smoking status: Current Every Day Smoker -- 1.00 packs/day for 36 years    Types: Cigarettes  . Smokeless tobacco: Current User     Comment: one pack per day  . Alcohol Use: Yes     Comment: once a year when they go to the beach   Live at home Live with spouse Smokes 1 1/2 ppd occasional ETOH On disability for lower back fusion  OB History    No data available     Review of Systems  Constitutional: Negative for fever and chills.  Gastrointestinal: Positive for nausea, vomiting and diarrhea.  Neurological: Positive for dizziness and weakness.  All other systems reviewed and are negative.     Allergies  Bee venom; Beesix; Penicillins; Aspirin; and Codeine  Home Medications   Prior to Admission medications   Medication Sig Start Date End Date Taking? Authorizing Provider  ALPRAZolam Duanne Moron) 0.5 MG tablet Take 0.5 mg by mouth daily.    Historical Provider, MD  AMITIZA 24 MCG capsule Take 1 capsule (24 mcg total) by mouth 2 (two) times daily with a meal. 04/03/14   Danie Binder, MD  Biotin 5000 MCG CAPS Take 5,000 mcg by mouth 2 (two) times daily.      Historical Provider, MD  Cholecalciferol (VITAMIN D) 2000 UNITS tablet Take 2,000 Units by mouth daily.      Historical Provider, MD  cycloSPORINE (RESTASIS) 0.05 % ophthalmic emulsion Place 1 drop into both eyes 2 (two) times daily.    Historical Provider, MD  diazepam (VALIUM) 10 MG tablet Take 1 tablet (10 mg total) by mouth every 8 (eight) hours as needed (muscle spasms). 08/29/14   Aviva Signs  Md, MD  dicyclomine (BENTYL) 10 MG capsule 1 po AT 8 AM, 12 NOON, AND 4 PM FOR 3 DAYS THEN 1 PO AT 8 AM AND 4 PM FOR 4 DAYS THEN USE AS NEEDED FOR ABDOMINAL PAIN/DIARRHEA. Patient not taking: Reported on 06/29/2014 03/16/14   Danie Binder, MD  DOCQLACE 100 MG capsule TAKE 1 CAPSULE BY MOUTH THREE TIMES DAILY    Orvil Feil, NP  FIBER SELECT GUMMIES PO Take 2 tablets by mouth 2 (two) times daily.     Historical Provider, MD  furosemide (LASIX) 20 MG tablet Take 20 mg by mouth 2 (two) times a week. Takes on Mondays and Fridays    Historical Provider, MD  gabapentin (NEURONTIN) 300 MG capsule Take 300 mg by mouth 3 (three) times daily.    Historical Provider, MD  LINZESS 290 MCG CAPS capsule TAKE 1 CAPSULE BY MOUTH EVERY DAY 30 MINUTES PRIOR TO BREAKFAST 08/24/14   Orvil Feil, NP  loratadine (CLARITIN) 10 MG tablet Take 10 mg by mouth daily.      Historical Provider, MD  loteprednol (ALREX) 0.2 % SUSP Place 1 drop into both eyes 2 (two) times daily.    Historical Provider, MD  meloxicam (MOBIC) 15 MG tablet Take 15 mg by mouth daily.    Historical Provider, MD  Olopatadine HCl 0.2 % SOLN Place 1 drop into both eyes 2 (two) times daily.    Historical Provider, MD  OxyCODONE (OXYCONTIN) 15 mg T12A 12 hr tablet Take 1 tablet (15 mg total) by mouth every 12 (twelve) hours. 08/29/14   Aviva Signs Md, MD  oxyCODONE (ROXICODONE) 15 MG immediate release tablet Take 15 mg by mouth every 4 (four) hours as needed.    Historical Provider, MD  senna (SENOKOT) 8.6 MG tablet Take 4 tablets by mouth daily.    Historical Provider, MD  SUMAtriptan (IMITREX) 100 MG tablet Take 100 mg by mouth every 2 (two) hours as needed for migraine or headache. May repeat in 2 hours if headache persists or recurs.    Historical Provider, MD  topiramate (TOPAMAX) 50 MG tablet Take 50 mg by mouth 2 (two) times daily.     Historical Provider, MD   BP 118/81 mmHg  Pulse 99  Temp(Src) 99.1 F (37.3 C) (Oral)  Resp 16  Ht 5\' 3"  (1.6 m)   Wt 130 lb (58.968 kg)  BMI 23.03 kg/m2  SpO2 100%  LMP 07/22/2011  Vital signs normal   Physical Exam  Constitutional: She is oriented to  person, place, and time. She appears well-developed and well-nourished.  Non-toxic appearance. She does not appear ill. No distress.  HENT:  Head: Normocephalic and atraumatic.  Right Ear: External ear normal.  Left Ear: External ear normal.  Nose: Nose normal. No mucosal edema or rhinorrhea.  Mouth/Throat: Oropharynx is clear and moist. Mucous membranes are dry. No dental abscesses or uvula swelling.  Tongue and lips are dry  Eyes: Conjunctivae and EOM are normal. Pupils are equal, round, and reactive to light.  Neck: Normal range of motion and full passive range of motion without pain. Neck supple.  Cardiovascular: Normal rate, regular rhythm and normal heart sounds.  Exam reveals no gallop and no friction rub.   No murmur heard. Pulmonary/Chest: Effort normal and breath sounds normal. No respiratory distress. She has no wheezes. She has no rhonchi. She has no rales. She exhibits no tenderness and no crepitus.  Abdominal: Soft. Normal appearance. She exhibits no distension. Bowel sounds are decreased. There is no tenderness. There is no rebound and no guarding.  Healing midline surgical star from the umbilicus inferiorly with staples intact.   Musculoskeletal: Normal range of motion. She exhibits no edema or tenderness.  Moves all extremities well.   Neurological: She is alert and oriented to person, place, and time. She has normal strength. No cranial nerve deficit.  Skin: Skin is warm, dry and intact. No rash noted. No erythema. No pallor.  Psychiatric: She has a normal mood and affect. Her speech is normal and behavior is normal. Her mood appears not anxious.  Nursing note and vitals reviewed.   ED Course  Procedures (including critical care time)  Medications  0.9 %  sodium chloride infusion (0 mLs Intravenous Stopped 09/03/14 0110)     Followed by  0.9 %  sodium chloride infusion (0 mLs Intravenous Stopped 09/03/14 0110)    Followed by  0.9 %  sodium chloride infusion (0 mLs Intravenous Stopped 09/03/14 0215)  metoCLOPramide (REGLAN) injection 10 mg (10 mg Intravenous Given 09/03/14 0002)  diphenhydrAMINE (BENADRYL) injection 25 mg (25 mg Intravenous Given 09/03/14 0003)  fentaNYL (SUBLIMAZE) injection 50 mcg (50 mcg Intravenous Given 09/03/14 0003)  ondansetron (ZOFRAN) injection 4 mg (4 mg Intravenous Given 09/03/14 0148)  iohexol (OMNIPAQUE) 300 MG/ML solution 50 mL (50 mLs Oral Contrast Given 09/03/14 0329)  iohexol (OMNIPAQUE) 300 MG/ML solution 100 mL (100 mLs Intravenous Contrast Given 09/03/14 0329)  ciprofloxacin (CIPRO) IVPB 400 mg (400 mg Intravenous New Bag/Given 09/03/14 0556)  clindamycin (CLEOCIN) IVPB 600 mg (0 mg Intravenous Stopped 09/03/14 0541)  sodium chloride 0.9 % bolus 1,000 mL (0 mLs Intravenous Stopped 09/03/14 0602)  promethazine (PHENERGAN) injection 12.5 mg (12.5 mg Intravenous Given 09/03/14 0515)  promethazine (PHENERGAN) 25 MG/ML injection (  Duplicate 7/86/76 7209)    DIAGNOSTIC STUDIES: Oxygen Saturation is 98% on room air, normal by my interpretation.    COORDINATION OF CARE: 11:43 PM Discussed treatment plan with patient at beside, the patient agrees with the plan and has no further questions at this time.patient was started on IV fluids for rehydration for her apparent dehydration seen on physical exam.  00:53 Radiology called Xray results.   01:00 discussed xray results and need for CT scan.   Nurse reports continued nausea and vomiting. She was given Zofran twice then given Phenergan IV for her nausea.  4:10 AM radiologist called her CT results to me. Her skin was worrisome for a perforation although the site of perforation is not seen. There was no extraluminal  contrast seen.  Patient and her husband given the results of her CT scan. They were advised she would be admitted.I started the  patient on IV Cipro and clindamycin for possible intestinal perforation.  4:29 AM patient discussed with Dr. Arnoldo Morale. He states he wants medicine to admit. He wants NG tube to be placed. I discussed diet started her on Cipro and clindamycin due to her penicillin allergy and he is in agreement with that. He states he's coming to the ED to see her this morning.    Labs Review Results for orders placed or performed during the hospital encounter of 09/02/14  CBC with Differential  Result Value Ref Range   WBC 10.1 4.0 - 10.5 K/uL   RBC 5.42 (H) 3.87 - 5.11 MIL/uL   Hemoglobin 17.2 (H) 12.0 - 15.0 g/dL   HCT 48.5 (H) 36.0 - 46.0 %   MCV 89.5 78.0 - 100.0 fL   MCH 31.7 26.0 - 34.0 pg   MCHC 35.5 30.0 - 36.0 g/dL   RDW 13.7 11.5 - 15.5 %   Platelets 585 (H) 150 - 400 K/uL   Neutrophils Relative % 73 43 - 77 %   Neutro Abs 7.4 1.7 - 7.7 K/uL   Lymphocytes Relative 15 12 - 46 %   Lymphs Abs 1.6 0.7 - 4.0 K/uL   Monocytes Relative 11 3 - 12 %   Monocytes Absolute 1.1 (H) 0.1 - 1.0 K/uL   Eosinophils Relative 0 0 - 5 %   Eosinophils Absolute 0.0 0.0 - 0.7 K/uL   Basophils Relative 1 0 - 1 %   Basophils Absolute 0.1 0.0 - 0.1 K/uL  Basic metabolic panel  Result Value Ref Range   Sodium 137 135 - 145 mmol/L   Potassium 4.1 3.5 - 5.1 mmol/L   Chloride 97 96 - 112 mmol/L   CO2 23 19 - 32 mmol/L   Glucose, Bld 114 (H) 70 - 99 mg/dL   BUN 25 (H) 6 - 23 mg/dL   Creatinine, Ser 1.41 (H) 0.50 - 1.10 mg/dL   Calcium 10.8 (H) 8.4 - 10.5 mg/dL   GFR calc non Af Amer 45 (L) >90 mL/min   GFR calc Af Amer 52 (L) >90 mL/min   Anion gap 17 (H) 5 - 15  Urinalysis, Routine w reflex microscopic  Result Value Ref Range   Color, Urine AMBER (A) YELLOW   APPearance CLEAR CLEAR   Specific Gravity, Urine >1.030 (H) 1.005 - 1.030   pH 5.5 5.0 - 8.0   Glucose, UA NEGATIVE NEGATIVE mg/dL   Hgb urine dipstick NEGATIVE NEGATIVE   Bilirubin Urine LARGE (A) NEGATIVE   Ketones, ur 40 (A) NEGATIVE mg/dL    Protein, ur 100 (A) NEGATIVE mg/dL   Urobilinogen, UA 1.0 0.0 - 1.0 mg/dL   Nitrite POSITIVE (A) NEGATIVE   Leukocytes, UA NEGATIVE NEGATIVE  Lactic acid, plasma  Result Value Ref Range   Lactic Acid, Venous 2.0 0.5 - 2.0 mmol/L  Lactic acid, plasma  Result Value Ref Range   Lactic Acid, Venous 1.5 0.5 - 2.0 mmol/L  Pregnancy, urine  Result Value Ref Range   Preg Test, Ur NEGATIVE NEGATIVE  Urine microscopic-add on  Result Value Ref Range   Squamous Epithelial / LPF MANY (A) RARE   WBC, UA 11-20 <3 WBC/hpf   RBC / HPF 3-6 <3 RBC/hpf   Bacteria, UA MANY (A) RARE   Urine-Other MUCOUS PRESENT    Laboratory interpretation all normal except renal insufficiency, elevated  Hb c/w dehydration   Imaging Review Dg Abd Acute W/chest  09/03/2014   CLINICAL DATA:  Generalized abdominal pain, vomiting, nausea, diarrhea and dizziness for 2 days. Recent colectomy. Initial encounter.  EXAM: ACUTE ABDOMEN SERIES (ABDOMEN 2 VIEW & CHEST 1 VIEW)  COMPARISON:  Lumbar spine radiographs from 07/08/2013  FINDINGS: Free intra-abdominal air is identified, more than expected 10 days status post colectomy.  Diffusely distended loops of small bowel are seen, measuring up to 5.1 cm in diameter, reflecting either ileus or some degree of small bowel obstruction. The remaining colon is not well characterized. Bowel suture lines are seen within the upper pelvis. Skin staples are seen overlying the pelvis. The patient is status post lumbar spinal fusion at L5-S1.  The lungs appear clear bilaterally. No focal consolidation, pleural effusion or pneumothorax is seen.  The cardiomediastinal silhouette is within normal limits. No acute osseous abnormalities are identified.  IMPRESSION: 1. Free intra-abdominal air seen, more than expected 10 days status post colectomy. CT of the abdomen and pelvis is recommended for further evaluation. 2. Diffusely distended loops of small bowel, measuring up to 5.1 cm in diameter, reflecting  either ileus or some degree of small bowel obstruction. 3. No acute cardiopulmonary process seen.  Critical Value/emergent results were called by telephone at the time of interpretation on 09/03/2014 at 12:58 am to Dr. Rolland Tyler, who verbally acknowledged these results.   Electronically Signed   By: Garald Balding M.D.   On: 09/03/2014 01:02    Ct Abdomen Pelvis W Contrast  09/03/2014   CLINICAL DATA:  Abdominal pain, nausea and vomiting. Right and left lower quadrant pain with frequent vomiting since yesterday. Post colectomy 10 days prior 08/24/2014.  EXAM: CT ABDOMEN AND PELVIS WITH CONTRAST  TECHNIQUE: Multidetector CT imaging of the abdomen and pelvis was performed using the standard protocol following bolus administration of intravenous contrast.  CONTRAST:  91mL OMNIPAQUE IOHEXOL 300 MG/ML SOLN, 115mL OMNIPAQUE IOHEXOL 300 MG/ML SOLN  COMPARISON:  Abdominal radiographs earlier this day.  FINDINGS: The included lung bases are clear.  As seen on prior radiographs there is moderate volume of free intra-abdominal air primarily in the upper abdomen. There is a moderate volume of intra-abdominal ascites. The stomach is distended with ingested oral contrast. There is diffuse fluid-filled dilated small bowel throughout. Distal small bowel with less dilated, however has bowel wall thickening and mild enhancement. There is no transition point. The ileal colonic anastomosis in the right lower quadrant is widely patent. The in situ sigmoid colon is fluid-filled. There is no definite air adjacent to the enteric suture line. There is no loculated abscess. There is no pneumatosis or portal venous gas.  The liver, gallbladder, spleen, pancreas, adrenal glands, and kidneys are normal. Abdominal aorta is normal in caliber.  Within the pelvis the bladder is physiologically distended. Uterus and adnexa are normal for age.  Midline abdominal skin staples noted. No subcutaneous fluid collection.  Postsurgical change in the  lumbosacral spine. No acute osseous abnormality.  IMPRESSION: Intra-abdominal free air and free fluid, this is greater than expected for 10 days post intra-abdominal surgery. Diffuse small bowel dilatation, with small bowel thickening and enhancement distally just proximal to the ileal colonic anastomosis. There is no extraluminal oral contrast. No loculated collection to suggest abscess. A site of bowel perforation is not definitively seen.  These results were called by telephone at the time of interpretation on 09/03/2014 at 4:09 am to Dr. Rolland Tyler , who verbally acknowledged these results.  Electronically Signed   By: Jeb Levering M.D.   On: 09/03/2014 04:14      EKG Interpretation None      MDM   Final diagnoses:  Nausea vomiting and diarrhea  Dehydration  Acute renal insufficiency  Generalized abdominal pain    Plan admission   Alicia Porter, MD, FACEP   I personally performed the services described in this documentation, which was scribed in my presence. The recorded information has been reviewed and considered.  Alicia Porter, MD, Barbette Or, MD 09/03/14 [phone]

## 2014-09-03 ENCOUNTER — Encounter (HOSPITAL_COMMUNITY): Payer: Self-pay | Admitting: *Deleted

## 2014-09-03 ENCOUNTER — Emergency Department (HOSPITAL_COMMUNITY): Payer: Medicaid Other

## 2014-09-03 DIAGNOSIS — Z9049 Acquired absence of other specified parts of digestive tract: Secondary | ICD-10-CM | POA: Diagnosis present

## 2014-09-03 DIAGNOSIS — F17210 Nicotine dependence, cigarettes, uncomplicated: Secondary | ICD-10-CM | POA: Diagnosis present

## 2014-09-03 DIAGNOSIS — N179 Acute kidney failure, unspecified: Secondary | ICD-10-CM | POA: Diagnosis present

## 2014-09-03 DIAGNOSIS — K3184 Gastroparesis: Secondary | ICD-10-CM | POA: Diagnosis present

## 2014-09-03 DIAGNOSIS — N390 Urinary tract infection, site not specified: Secondary | ICD-10-CM

## 2014-09-03 DIAGNOSIS — E860 Dehydration: Secondary | ICD-10-CM | POA: Diagnosis present

## 2014-09-03 DIAGNOSIS — Z803 Family history of malignant neoplasm of breast: Secondary | ICD-10-CM | POA: Diagnosis not present

## 2014-09-03 DIAGNOSIS — K529 Noninfective gastroenteritis and colitis, unspecified: Secondary | ICD-10-CM | POA: Diagnosis present

## 2014-09-03 DIAGNOSIS — K668 Other specified disorders of peritoneum: Secondary | ICD-10-CM | POA: Insufficient documentation

## 2014-09-03 DIAGNOSIS — R112 Nausea with vomiting, unspecified: Secondary | ICD-10-CM | POA: Diagnosis present

## 2014-09-03 DIAGNOSIS — E876 Hypokalemia: Secondary | ICD-10-CM | POA: Diagnosis present

## 2014-09-03 DIAGNOSIS — R1084 Generalized abdominal pain: Secondary | ICD-10-CM

## 2014-09-03 DIAGNOSIS — G43A0 Cyclical vomiting, not intractable: Secondary | ICD-10-CM

## 2014-09-03 LAB — CBC
HCT: 34.9 % — ABNORMAL LOW (ref 36.0–46.0)
Hemoglobin: 11.8 g/dL — ABNORMAL LOW (ref 12.0–15.0)
MCH: 30.4 pg (ref 26.0–34.0)
MCHC: 33.8 g/dL (ref 30.0–36.0)
MCV: 89.9 fL (ref 78.0–100.0)
Platelets: 430 10*3/uL — ABNORMAL HIGH (ref 150–400)
RBC: 3.88 MIL/uL (ref 3.87–5.11)
RDW: 13.7 % (ref 11.5–15.5)
WBC: 7.1 10*3/uL (ref 4.0–10.5)

## 2014-09-03 LAB — URINE MICROSCOPIC-ADD ON

## 2014-09-03 LAB — URINALYSIS, ROUTINE W REFLEX MICROSCOPIC
GLUCOSE, UA: NEGATIVE mg/dL
HGB URINE DIPSTICK: NEGATIVE
Ketones, ur: 40 mg/dL — AB
LEUKOCYTES UA: NEGATIVE
Nitrite: POSITIVE — AB
PROTEIN: 100 mg/dL — AB
Specific Gravity, Urine: >1.03 — ABNORMAL HIGH (ref 1.005–1.030)
Urobilinogen, UA: 1 mg/dL (ref 0.0–1.0)
pH: 5.5 (ref 5.0–8.0)

## 2014-09-03 LAB — MRSA PCR SCREENING: MRSA by PCR: NEGATIVE

## 2014-09-03 LAB — CREATININE, SERUM
Creatinine, Ser: 0.79 mg/dL (ref 0.50–1.10)
GFR calc non Af Amer: >90 mL/min (ref >90)

## 2014-09-03 LAB — PREGNANCY, URINE: PREG TEST UR: NEGATIVE

## 2014-09-03 LAB — LACTIC ACID, PLASMA: Lactic Acid, Venous: 1.5 mmol/L (ref 0.5–2.0)

## 2014-09-03 MED ORDER — LOTEPREDNOL ETABONATE 0.5 % OP SUSP
1.0000 [drp] | Freq: Two times a day (BID) | OPHTHALMIC | Status: DC
  Administered 2014-09-03 – 2014-09-07 (×9): 1 [drp] via OPHTHALMIC
  Filled 2014-09-03: qty 5

## 2014-09-03 MED ORDER — METRONIDAZOLE IN NACL 5-0.79 MG/ML-% IV SOLN: 500.0000 mg | Freq: Three times a day (TID) | INTRAVENOUS | Status: DC

## 2014-09-03 MED ORDER — PROMETHAZINE HCL 25 MG/ML IJ SOLN
12.5000 mg | Freq: Once | INTRAMUSCULAR | Status: AC
  Administered 2014-09-03: 12.5 mg via INTRAVENOUS
  Filled 2014-09-03: qty 1

## 2014-09-03 MED ORDER — OLOPATADINE HCL 0.1 % OP SOLN
1.0000 [drp] | Freq: Two times a day (BID) | OPHTHALMIC | Status: DC
  Administered 2014-09-04 – 2014-09-07 (×7): 1 [drp] via OPHTHALMIC
  Filled 2014-09-03: qty 5

## 2014-09-03 MED ORDER — METRONIDAZOLE IN NACL 5-0.79 MG/ML-% IV SOLN
500.0000 mg | Freq: Three times a day (TID) | INTRAVENOUS | Status: DC
  Administered 2014-09-03 – 2014-09-08 (×16): 500 mg via INTRAVENOUS
  Filled 2014-09-03 (×16): qty 100

## 2014-09-03 MED ORDER — PANTOPRAZOLE SODIUM 40 MG IV SOLR
40.0000 mg | INTRAVENOUS | Status: DC
  Administered 2014-09-03 – 2014-09-04 (×2): 40 mg via INTRAVENOUS
  Filled 2014-09-03 (×2): qty 40

## 2014-09-03 MED ORDER — SODIUM CHLORIDE 0.9 % IV BOLUS (SEPSIS)
1000.0000 mL | Freq: Once | INTRAVENOUS | Status: AC
  Administered 2014-09-03: 1000 mL via INTRAVENOUS

## 2014-09-03 MED ORDER — ONDANSETRON HCL 4 MG/2ML IJ SOLN: 4.0000 mg | Freq: Three times a day (TID) | INTRAMUSCULAR | Status: AC | PRN

## 2014-09-03 MED ORDER — ACETAMINOPHEN 650 MG RE SUPP
650.0000 mg | Freq: Four times a day (QID) | RECTAL | Status: DC | PRN
  Filled 2014-09-03: qty 1

## 2014-09-03 MED ORDER — PROMETHAZINE HCL 25 MG/ML IJ SOLN
INTRAMUSCULAR | Status: AC
  Filled 2014-09-03: qty 1

## 2014-09-03 MED ORDER — ENOXAPARIN SODIUM 40 MG/0.4ML ~~LOC~~ SOLN
40.0000 mg | SUBCUTANEOUS | Status: DC
  Administered 2014-09-04: 40 mg via SUBCUTANEOUS
  Filled 2014-09-03: qty 0.4

## 2014-09-03 MED ORDER — IOHEXOL 300 MG/ML  SOLN
50.0000 mL | Freq: Once | INTRAMUSCULAR | Status: AC | PRN
  Administered 2014-09-03: 50 mL via ORAL

## 2014-09-03 MED ORDER — LORAZEPAM 2 MG/ML IJ SOLN
INTRAMUSCULAR | Status: AC
  Filled 2014-09-03: qty 1

## 2014-09-03 MED ORDER — ONDANSETRON HCL 4 MG/2ML IJ SOLN
4.0000 mg | Freq: Once | INTRAMUSCULAR | Status: AC
  Administered 2014-09-03: 4 mg via INTRAVENOUS
  Filled 2014-09-03: qty 2

## 2014-09-03 MED ORDER — LORAZEPAM 2 MG/ML IJ SOLN
2.0000 mg | Freq: Once | INTRAMUSCULAR | Status: AC
  Administered 2014-09-03: 2 mg via INTRAVENOUS

## 2014-09-03 MED ORDER — FENTANYL CITRATE 0.05 MG/ML IJ SOLN: 50.0000 ug | INTRAMUSCULAR | Status: DC | PRN

## 2014-09-03 MED ORDER — MORPHINE SULFATE 2 MG/ML IJ SOLN
1.0000 mg | INTRAMUSCULAR | Status: DC | PRN
  Administered 2014-09-03 – 2014-09-05 (×11): 1 mg via INTRAVENOUS
  Filled 2014-09-03 (×11): qty 1

## 2014-09-03 MED ORDER — ACETAMINOPHEN 10 MG/ML IV SOLN
INTRAVENOUS | Status: AC
  Filled 2014-09-03: qty 300

## 2014-09-03 MED ORDER — CIPROFLOXACIN IN D5W 400 MG/200ML IV SOLN
400.0000 mg | Freq: Two times a day (BID) | INTRAVENOUS | Status: DC
  Administered 2014-09-03 – 2014-09-04 (×2): 400 mg via INTRAVENOUS
  Filled 2014-09-03 (×2): qty 200

## 2014-09-03 MED ORDER — IOHEXOL 300 MG/ML  SOLN
100.0000 mL | Freq: Once | INTRAMUSCULAR | Status: AC | PRN
  Administered 2014-09-03: 100 mL via INTRAVENOUS

## 2014-09-03 MED ORDER — ONDANSETRON HCL 4 MG/2ML IJ SOLN
4.0000 mg | Freq: Four times a day (QID) | INTRAMUSCULAR | Status: DC | PRN
  Administered 2014-09-03 – 2014-09-08 (×16): 4 mg via INTRAVENOUS
  Filled 2014-09-03 (×16): qty 2

## 2014-09-03 MED ORDER — CLINDAMYCIN PHOSPHATE 600 MG/50ML IV SOLN
600.0000 mg | Freq: Once | INTRAVENOUS | Status: AC
  Administered 2014-09-03: 600 mg via INTRAVENOUS
  Filled 2014-09-03: qty 50

## 2014-09-03 MED ORDER — CIPROFLOXACIN IN D5W 400 MG/200ML IV SOLN
400.0000 mg | Freq: Once | INTRAVENOUS | Status: AC
  Administered 2014-09-03: 400 mg via INTRAVENOUS
  Filled 2014-09-03: qty 200

## 2014-09-03 MED ORDER — ENOXAPARIN SODIUM 40 MG/0.4ML ~~LOC~~ SOLN: 40.0000 mg | SUBCUTANEOUS | Status: DC

## 2014-09-03 MED ORDER — ACETAMINOPHEN 325 MG PO TABS
650.0000 mg | ORAL_TABLET | Freq: Four times a day (QID) | ORAL | Status: DC | PRN
  Filled 2014-09-03: qty 2

## 2014-09-03 MED ORDER — SODIUM CHLORIDE 0.9 % IV SOLN
INTRAVENOUS | Status: DC
  Administered 2014-09-03 (×3): via INTRAVENOUS

## 2014-09-03 MED ORDER — CYCLOSPORINE 0.05 % OP EMUL
1.0000 [drp] | Freq: Two times a day (BID) | OPHTHALMIC | Status: DC
Start: 2014-09-03 — End: 2014-09-08
  Administered 2014-09-03 – 2014-09-07 (×10): 1 [drp] via OPHTHALMIC
  Filled 2014-09-03 (×13): qty 1

## 2014-09-03 MED ORDER — ACETAMINOPHEN 10 MG/ML IV SOLN
1000.0000 mg | Freq: Four times a day (QID) | INTRAVENOUS | Status: AC
  Administered 2014-09-03 – 2014-09-04 (×4): 1000 mg via INTRAVENOUS
  Filled 2014-09-03 (×4): qty 100

## 2014-09-03 MED ORDER — ONDANSETRON HCL 4 MG PO TABS
4.0000 mg | ORAL_TABLET | Freq: Four times a day (QID) | ORAL | Status: DC | PRN
  Administered 2014-09-06 (×3): 4 mg via ORAL
  Filled 2014-09-03 (×3): qty 1

## 2014-09-03 NOTE — Progress Notes (Signed)
ANTIBIOTIC CONSULT NOTE  Pharmacy Consult for Cipro Indication: Intra-abdominal Infection  Allergies  Allergen Reactions  . Bee Venom     anaphylaxis  . Beesix [Pyridoxine Hcl] Anaphylaxis  . Penicillins Other (See Comments)    Makes hair fall out  . Aspirin Swelling and Rash  . Codeine Nausea And Vomiting and Rash    Patient Measurements: Height: 5\' 3"  (160 cm) Weight: 137 lb 12.6 oz (62.5 kg) IBW/kg (Calculated) : 52.4  Vital Signs: Temp: 99.3 F (37.4 C) (03/12 0808) Temp Source: Oral (03/12 0808) BP: 114/59 mmHg (03/12 0647) Pulse Rate: 96 (03/12 0647) Intake/Output from previous day: 03/11 0701 - 03/12 0700 In: 200 [IV Piggyback:200] Out: 400 [Emesis/NG output:400] Intake/Output from this shift: Total I/O In: -  Out: 50 [Emesis/NG output:50]  Labs:  Recent Labs  09/02/14 2316 09/03/14 0806  WBC 10.1 7.1  HGB 17.2* 11.8*  PLT 585* 430*  CREATININE 1.41* 0.79   Estimated Creatinine Clearance: 74.2 mL/min (by C-G formula based on Cr of 0.79). No results for input(s): VANCOTROUGH, VANCOPEAK, VANCORANDOM, GENTTROUGH, GENTPEAK, GENTRANDOM, TOBRATROUGH, TOBRAPEAK, TOBRARND, AMIKACINPEAK, AMIKACINTROU, AMIKACIN in the last 72 hours.   Microbiology: No results found for this or any previous visit (from the past 720 hour(s)).  Anti-infectives    Start     Dose/Rate Route Frequency Ordered Stop   09/03/14 0800  metroNIDAZOLE (FLAGYL) IVPB 500 mg     500 mg 100 mL/hr over 60 Minutes Intravenous Every 8 hours 09/03/14 0652     09/03/14 0630  metroNIDAZOLE (FLAGYL) IVPB 500 mg  Status:  Discontinued     500 mg 100 mL/hr over 60 Minutes Intravenous Every 8 hours 09/03/14 0622 09/03/14 0652   09/03/14 0430  ciprofloxacin (CIPRO) IVPB 400 mg     400 mg 200 mL/hr over 60 Minutes Intravenous  Once 09/03/14 0419 09/03/14 0656   09/03/14 0430  clindamycin (CLEOCIN) IVPB 600 mg     600 mg 100 mL/hr over 30 Minutes Intravenous  Once 09/03/14 0419 09/03/14 0541      Assessment: Okay for Protocol, received initial doses.  Renal function improving.  Goal of Therapy:  Eradicate infection.  Plan:  Cipro 400mg  IV every 12 hours. Monitor labs, micro and vitals.   Pricilla Larsson 09/03/2014,9:23 AM

## 2014-09-03 NOTE — Progress Notes (Signed)
Patient seen and examined. Admitted earlier today for abd/pain/n/v/diarrhea. 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 CT scan showed free intraperitoneal air. Recommendation from surgery to admit for IVF and antibiotics and he will follow in am. IVF for her ARF. Will continue to follow.  Domingo Mend, MD [facility] Pager: [phone]

## 2014-09-03 NOTE — H&P (Addendum)
PCP:   Monico Blitz, MD   Chief Complaint:  Nausea and vomiting  HPI: 44 year old female who   has a past medical history of Seasonal allergies; BMI (body mass index) 20.0-29.9 (OCT 2011 172 LBS); Serrated adenoma of colon (DEC 2011); Headache(784.0); Anxiety; PONV (postoperative nausea and vomiting); Chronic back pain; Neuropathy; and Degenerative disc disease, thoracic. Patient underwent elective near total colectomy for colonic atonia, pseudoobstruction, constipation on 08/24/2014 and was discharged on 08/29/2014. Patient says that she was doing fine until yesterday when she started having nausea and vomiting and she also complained of diarrhea. Patient says that she could not keep anything down. Due to anemia from surgical blood loss patient also received 2 units of blood on 08/31/2014. Patient also complains of bloating and feeling gassy. She denies chest pain shortness of breath, no fever no dysuria urgency or frequency of urination. In the ED CT scan of the abdomen was done which showed free intra-abdominal air seen more than expected tenderness status post colectomy, diffuse small bowel dilation evident small bowel thickening and enhancement proximal to ileocolonic anastomosis. Site of bowel perforation not definitely seen on the CT scan. Gen. surgery was consulted by the ED physician and Dr. Arnoldo Morale saw the patient in the ED.  Allergies:   Allergies  Allergen Reactions  . Bee Venom     anaphylaxis  . Beesix [Pyridoxine Hcl] Anaphylaxis  . Penicillins Other (See Comments)    Makes hair fall out  . Aspirin Swelling and Rash  . Codeine Nausea And Vomiting and Rash      Past Medical History  Diagnosis Date  . Seasonal allergies   . BMI (body mass index) 20.0-29.01 Apr 2010 172 LBS  . 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 Headache(784.0)   . Anxiety   . PONV (postoperative nausea and vomiting)   . Chronic back pain   . Neuropathy   . Degenerative disc disease, thoracic      Past Surgical History  Procedure Laterality Date  . Tubal ligation    . Ovarian cyst removal    . Breast lumpectomy      from the left breast  . Colonoscopy  NOV 2011 SCREENING     HYPERPLASTIC RECTAL POLYP, SML IH, incomplete due to discomfort  . Colonoscopy  DEC 2011 w/ PROPOFOL    SERRATED ADENOMA, HYPERPLASTIC POLY[S[  . Knee surgery Bilateral     2 on righ-1 scope and one open; and one on left  . Ablation      endometrial ablation  . Mouth surgery  07/21/2013  . Smart pill procedure N/A 07/04/2014    Procedure: SMART PILL PROCEDURE;  Surgeon: Danie Binder, MD;  Location: [facility];  Service: Endoscopy;  Laterality: N/A;  800  . Back surgery  05/14/2011    Lumbar Fusion and cage  . Colectomy N/A 08/24/2014    Procedure: TOTAL COLECTOMY;  Surgeon: Jamesetta So, MD;  Location: [facility];  Service: General;  Laterality: N/A;    Prior to Admission medications   Medication Sig Start Date End Date Taking? Authorizing Provider  ALPRAZolam Duanne Moron) 0.5 MG tablet Take 0.5 mg by mouth daily.    Historical Provider, MD  AMITIZA 24 MCG capsule Take 1 capsule (24 mcg total) by mouth 2 (two) times daily with a meal. 04/03/14   Danie Binder, MD  Biotin 5000 MCG CAPS Take 5,000 mcg by mouth 2 (two) times daily.      Historical Provider, MD  Cholecalciferol (  VITAMIN D) 2000 UNITS tablet Take 2,000 Units by mouth daily.      Historical Provider, MD  cycloSPORINE (RESTASIS) 0.05 % ophthalmic emulsion Place 1 drop into both eyes 2 (two) times daily.    Historical Provider, MD  diazepam (VALIUM) 10 MG tablet Take 1 tablet (10 mg total) by mouth every 8 (eight) hours as needed (muscle spasms). 08/29/14   Aviva Signs Md, MD  dicyclomine (BENTYL) 10 MG capsule 1 po AT 8 AM, 12 NOON, AND 4 PM FOR 3 DAYS THEN 1 PO AT 8 AM AND 4 PM FOR 4 DAYS THEN USE AS NEEDED FOR ABDOMINAL PAIN/DIARRHEA. Patient not taking: Reported on 06/29/2014 03/16/14   Danie Binder, MD  DOCQLACE 100 MG capsule TAKE 1 CAPSULE  BY MOUTH THREE TIMES DAILY    Orvil Feil, NP  FIBER SELECT GUMMIES PO Take 2 tablets by mouth 2 (two) times daily.     Historical Provider, MD  furosemide (LASIX) 20 MG tablet Take 20 mg by mouth 2 (two) times a week. Takes on Mondays and Fridays    Historical Provider, MD  gabapentin (NEURONTIN) 300 MG capsule Take 300 mg by mouth 3 (three) times daily.    Historical Provider, MD  LINZESS 290 MCG CAPS capsule TAKE 1 CAPSULE BY MOUTH EVERY DAY 30 MINUTES PRIOR TO BREAKFAST 08/24/14   Orvil Feil, NP  loratadine (CLARITIN) 10 MG tablet Take 10 mg by mouth daily.      Historical Provider, MD  loteprednol (ALREX) 0.2 % SUSP Place 1 drop into both eyes 2 (two) times daily.    Historical Provider, MD  meloxicam (MOBIC) 15 MG tablet Take 15 mg by mouth daily.    Historical Provider, MD  Olopatadine HCl 0.2 % SOLN Place 1 drop into both eyes 2 (two) times daily.    Historical Provider, MD  OxyCODONE (OXYCONTIN) 15 mg T12A 12 hr tablet Take 1 tablet (15 mg total) by mouth every 12 (twelve) hours. 08/29/14   Aviva Signs Md, MD  oxyCODONE (ROXICODONE) 15 MG immediate release tablet Take 15 mg by mouth every 4 (four) hours as needed.    Historical Provider, MD  senna (SENOKOT) 8.6 MG tablet Take 4 tablets by mouth daily.    Historical Provider, MD  SUMAtriptan (IMITREX) 100 MG tablet Take 100 mg by mouth every 2 (two) hours as needed for migraine or headache. May repeat in 2 hours if headache persists or recurs.    Historical Provider, MD  topiramate (TOPAMAX) 50 MG tablet Take 50 mg by mouth 2 (two) times daily.     Historical Provider, MD    Social History:  reports that she has been smoking Cigarettes.  She has a 36 pack-year smoking history. She does not have any smokeless tobacco history on file. She reports that she drinks alcohol. She reports that she does not use illicit drugs.  Family History  Problem Relation Age of Onset  . Breast cancer Maternal Aunt   . Colon cancer Neg Hx   . Colon polyps Neg  Hx      All the positives are listed in BOLD  Review of Systems:  HEENT: Headache, blurred vision, runny nose, sore throat Neck: Hypothyroidism, hyperthyroidism,,lymphadenopathy Chest : Shortness of breath, history of COPD, Asthma Heart : Chest pain, history of coronary arterey disease GI:  Nausea, vomiting, diarrhea, constipation, GERD GU: Dysuria, urgency, frequency of urination, hematuria Neuro: Stroke, seizures, syncope Psych: Depression, anxiety, hallucinations   Physical Exam: Blood pressure 117/65,  pulse 90, temperature 99.1 F (37.3 C), temperature source Oral, resp. rate 20, height 5\' 3"  (1.6 m), weight 58.968 kg (130 lb), last menstrual period 07/22/2011, SpO2 100 %. Constitutional:   Patient is a well-developed and well-nourished female in no acute distress and cooperative with exam. Head: Normocephalic and atraumatic Mouth: Mucus membranes moist Eyes: PERRL, EOMI, conjunctivae normal Neck: Supple, No Thyromegaly Cardiovascular: RRR, S1 normal, S2 normal Pulmonary/Chest: CTAB, no wheezes, rales, or rhonchi Abdominal: Soft. Non-tender, non-distended, bowel sounds are normal, no masses, organomegaly, or guarding present.  Neurological: A&O x3, Strength is normal and symmetric bilaterally, cranial nerve II-XII are grossly intact, no focal motor deficit, sensory intact to light touch bilaterally.  Extremities : No Cyanosis, Clubbing or Edema  Labs on Admission:  Basic Metabolic Panel:  Recent Labs Lab 08/27/14 0647 08/28/14 0626 08/29/14 0609 09/02/14 2316  NA 138 140 141 137  K 3.4* 3.7 3.5 4.1  CL 106 111 110 97  CO2 27 25 25 23   GLUCOSE 115* 94 105* 114*  BUN <5* 7 7 25*  CREATININE 0.73 0.70 0.76 1.41*  CALCIUM 8.5 8.1* 8.6 10.8*  PHOS 2.5  --   --   --    Liver Function Tests: No results for input(s): AST, ALT, ALKPHOS, BILITOT, PROT, ALBUMIN in the last 168 hours. No results for input(s): LIPASE, AMYLASE in the last 168 hours. No results for input(s):  AMMONIA in the last 168 hours. CBC:  Recent Labs Lab 08/27/14 0647 08/28/14 0626 08/29/14 0609 09/02/14 2316  WBC 8.5 7.7 7.8 10.1  NEUTROABS  --   --   --  7.4  HGB 8.7* 7.8* 10.5* 17.2*  HCT 25.5* 22.9* 31.5* 48.5*  MCV 89.5 90.9 89.0 89.5  PLT 191 207 222 585*   Cardiac Enzymes: No results for input(s): CKTOTAL, CKMB, CKMBINDEX, TROPONINI in the last 168 hours.  BNP (last 3 results) No results for input(s): BNP in the last 8760 hours.  ProBNP (last 3 results) No results for input(s): PROBNP in the last 8760 hours.  CBG: No results for input(s): GLUCAP in the last 168 hours.  Radiological Exams on Admission: Ct Abdomen Pelvis W Contrast  09/03/2014   CLINICAL DATA:  Abdominal pain, nausea and vomiting. Right and left lower quadrant pain with frequent vomiting since yesterday. Post colectomy 10 days prior 08/24/2014.  EXAM: CT ABDOMEN AND PELVIS WITH CONTRAST  TECHNIQUE: Multidetector CT imaging of the abdomen and pelvis was performed using the standard protocol following bolus administration of intravenous contrast.  CONTRAST:  28mL OMNIPAQUE IOHEXOL 300 MG/ML SOLN, 142mL OMNIPAQUE IOHEXOL 300 MG/ML SOLN  COMPARISON:  Abdominal radiographs earlier this day.  FINDINGS: The included lung bases are clear.  As seen on prior radiographs there is moderate volume of free intra-abdominal air primarily in the upper abdomen. There is a moderate volume of intra-abdominal ascites. The stomach is distended with ingested oral contrast. There is diffuse fluid-filled dilated small bowel throughout. Distal small bowel with less dilated, however has bowel wall thickening and mild enhancement. There is no transition point. The ileal colonic anastomosis in the right lower quadrant is widely patent. The in situ sigmoid colon is fluid-filled. There is no definite air adjacent to the enteric suture line. There is no loculated abscess. There is no pneumatosis or portal venous gas.  The liver, gallbladder,  spleen, pancreas, adrenal glands, and kidneys are normal. Abdominal aorta is normal in caliber.  Within the pelvis the bladder is physiologically distended. Uterus and adnexa are normal  for age.  Midline abdominal skin staples noted. No subcutaneous fluid collection.  Postsurgical change in the lumbosacral spine. No acute osseous abnormality.  IMPRESSION: Intra-abdominal free air and free fluid, this is greater than expected for 10 days post intra-abdominal surgery. Diffuse small bowel dilatation, with small bowel thickening and enhancement distally just proximal to the ileal colonic anastomosis. There is no extraluminal oral contrast. No loculated collection to suggest abscess. A site of bowel perforation is not definitively seen.  These results were called by telephone at the time of interpretation on 09/03/2014 at 4:09 am to Dr. Rolland Porter , who verbally acknowledged these results.   Electronically Signed   By: Jeb Levering M.D.   On: 09/03/2014 04:14   Dg Abd Acute W/chest  09/03/2014   CLINICAL DATA:  Generalized abdominal pain, vomiting, nausea, diarrhea and dizziness for 2 days. Recent colectomy. Initial encounter.  EXAM: ACUTE ABDOMEN SERIES (ABDOMEN 2 VIEW & CHEST 1 VIEW)  COMPARISON:  Lumbar spine radiographs from 07/08/2013  FINDINGS: Free intra-abdominal air is identified, more than expected 10 days status post colectomy.  Diffusely distended loops of small bowel are seen, measuring up to 5.1 cm in diameter, reflecting either ileus or some degree of small bowel obstruction. The remaining colon is not well characterized. Bowel suture lines are seen within the upper pelvis. Skin staples are seen overlying the pelvis. The patient is status post lumbar spinal fusion at L5-S1.  The lungs appear clear bilaterally. No focal consolidation, pleural effusion or pneumothorax is seen.  The cardiomediastinal silhouette is within normal limits. No acute osseous abnormalities are identified.  IMPRESSION: 1. Free  intra-abdominal air seen, more than expected 10 days status post colectomy. CT of the abdomen and pelvis is recommended for further evaluation. 2. Diffusely distended loops of small bowel, measuring up to 5.1 cm in diameter, reflecting either ileus or some degree of small bowel obstruction. 3. No acute cardiopulmonary process seen.  Critical Value/emergent results were called by telephone at the time of interpretation on 09/03/2014 at 12:58 am to Dr. Rolland Porter, who verbally acknowledged these results.   Electronically Signed   By: Garald Balding M.D.   On: 09/03/2014 01:02        Assessment/Plan Principal Problem:   AKI (acute kidney injury) Active Problems:   Acute renal insufficiency  Acute kidney injury-patient has now developed acute kidney injury from intractable nausea vomiting. Will continue with IV fluids and check BMP in a.m.  Enteritis/diarrhea- will obtain C. difficile PCR and start IV Flagyl 500 every 8. Patient already given Cipro which will be continued.  Nausea vomiting- will start Zofran when necessary for nausea vomiting. NG tube has been ordered by the general surgeon which will be placed in the ED.  Intra-abdominal free air- CT scan shows free intra-abdominal air more than expected 10 days post colectomy. Gen. surgery Dr. Arnoldo Morale has seen the patient and recommend IV fluids and antibiotics. He'll follow the patient tomorrow morning.  DVT prophylaxis Lovenox   Code status: Full code  Family discussion: No family at bedside   Time Spent on Admission 60 minutes  Mozella Rexrode S [facility] Pager: [phone] 09/03/2014, 5:28 AM  If 7PM-7AM, please contact night-coverage  [email or web address]  Password [email or web address]

## 2014-09-03 NOTE — Consult Note (Signed)
Reason for Consult: Nausea and vomiting Referring Physician: ER  Alicia Tyler is an 44 y.o. female.  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 She states that while she was at home, her bowel movements were not bloody and she was eating. She did have some diarrhea. No fever, chills. The nausea and vomiting worsened and she presented emergency room for further evaluation treatment. CT scan of the abdomen does reveal ascites within the abdominal cavity with dilated small bowel and more free air than one would expect for being 10 days out, but there is no contrast leakage or air around the anastomosis to suggest anastomotic leak. She did receive 2 units of packed red blood cells in the postoperative period which was felt to be second ttoacute surgical blood loss.  Past Medical History  Diagnosis Date  . Seasonal allergies   . BMI (body mass index) 20.0-29.01 Apr 2010 172 LBS  . 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 Headache(784.0)   . Anxiety   . PONV (postoperative nausea and vomiting)   . Chronic back pain   . Neuropathy   . Degenerative disc disease, thoracic     Past Surgical History  Procedure Laterality Date  . Tubal ligation    . Ovarian cyst removal    . Breast lumpectomy      from the left breast  . Colonoscopy  NOV 2011 SCREENING     HYPERPLASTIC RECTAL POLYP, SML IH, incomplete due to discomfort  . Colonoscopy  DEC 2011 w/ PROPOFOL    SERRATED ADENOMA, HYPERPLASTIC POLY[S[  . Knee surgery Bilateral     2 on righ-1 scope and one open; and one on left  . Ablation      endometrial ablation  . Mouth surgery  07/21/2013  . Smart pill procedure N/A 07/04/2014    Procedure: SMART PILL PROCEDURE;  Surgeon: Danie Binder, MD;  Location: [facility];  Service: Endoscopy;   Laterality: N/A;  800  . Back surgery  05/14/2011    Lumbar Fusion and cage  . Colectomy N/A 08/24/2014    Procedure: TOTAL COLECTOMY;  Surgeon: Jamesetta So, MD;  Location: [facility];  Service: General;  Laterality: N/A;    Family History  Problem Relation Age of Onset  . Breast cancer Maternal Aunt   . Colon cancer Neg Hx   . Colon polyps Neg Hx     Social History:  reports that she has been smoking Cigarettes.  She has a 36 pack-year smoking history. She does not have any smokeless tobacco history on file. She reports that she drinks alcohol. She reports that she does not use illicit drugs.  Allergies:  Allergies  Allergen Reactions  . Bee Venom     anaphylaxis  . Beesix [Pyridoxine Hcl] Anaphylaxis  . Penicillins Other (See Comments)    Makes hair fall out  . Aspirin Swelling and Rash  . Codeine Nausea And Vomiting and Rash    Medications: I have reviewed the patient's current medications.  Results for orders placed or performed during the hospital encounter of 09/02/14 (from the past 48 hour(s))  CBC with Differential     Status: Abnormal   Collection Time: 09/02/14 11:16 PM  Result Value Ref Range   WBC 10.1 4.0 - 10.5 K/uL   RBC 5.42 (H) 3.87 -  5.11 MIL/uL   Hemoglobin 17.2 (H) 12.0 - 15.0 g/dL   HCT 48.5 (H) 36.0 - 46.0 %   MCV 89.5 78.0 - 100.0 fL   MCH 31.7 26.0 - 34.0 pg   MCHC 35.5 30.0 - 36.0 g/dL   RDW 13.7 11.5 - 15.5 %   Platelets 585 (H) 150 - 400 K/uL   Neutrophils Relative % 73 43 - 77 %   Neutro Abs 7.4 1.7 - 7.7 K/uL   Lymphocytes Relative 15 12 - 46 %   Lymphs Abs 1.6 0.7 - 4.0 K/uL   Monocytes Relative 11 3 - 12 %   Monocytes Absolute 1.1 (H) 0.1 - 1.0 K/uL   Eosinophils Relative 0 0 - 5 %   Eosinophils Absolute 0.0 0.0 - 0.7 K/uL   Basophils Relative 1 0 - 1 %   Basophils Absolute 0.1 0.0 - 0.1 K/uL  Basic metabolic panel     Status: Abnormal   Collection Time: 09/02/14 11:16 PM  Result Value Ref Range   Sodium 137 135 - 145 mmol/L    Potassium 4.1 3.5 - 5.1 mmol/L   Chloride 97 96 - 112 mmol/L   CO2 23 19 - 32 mmol/L   Glucose, Bld 114 (H) 70 - 99 mg/dL   BUN 25 (H) 6 - 23 mg/dL   Creatinine, Ser 1.41 (H) 0.50 - 1.10 mg/dL   Calcium 10.8 (H) 8.4 - 10.5 mg/dL   GFR calc non Af Amer 45 (L) >90 mL/min   GFR calc Af Amer 52 (L) >90 mL/min    Comment: (NOTE) The eGFR has been calculated using the CKD EPI equation. This calculation has not been validated in all clinical situations. eGFR's persistently <90 mL/min signify possible Chronic Kidney Disease.    Anion gap 17 (H) 5 - 15  Lactic acid, plasma     Status: None   Collection Time: 09/02/14 11:16 PM  Result Value Ref Range   Lactic Acid, Venous 2.0 0.5 - 2.0 mmol/L    Comment: SPECIMEN SLIGHTLY HEMOLYZED  Urinalysis, Routine w reflex microscopic     Status: Abnormal   Collection Time: 09/03/14 12:58 AM  Result Value Ref Range   Color, Urine AMBER (A) YELLOW    Comment: BIOCHEMICALS MAY BE AFFECTED BY COLOR   APPearance CLEAR CLEAR   Specific Gravity, Urine >1.030 (H) 1.005 - 1.030   pH 5.5 5.0 - 8.0   Glucose, UA NEGATIVE NEGATIVE mg/dL   Hgb urine dipstick NEGATIVE NEGATIVE   Bilirubin Urine LARGE (A) NEGATIVE   Ketones, ur 40 (A) NEGATIVE mg/dL   Protein, ur 100 (A) NEGATIVE mg/dL   Urobilinogen, UA 1.0 0.0 - 1.0 mg/dL   Nitrite POSITIVE (A) NEGATIVE   Leukocytes, UA NEGATIVE NEGATIVE  Urine microscopic-add on     Status: Abnormal   Collection Time: 09/03/14 12:58 AM  Result Value Ref Range   Squamous Epithelial / LPF MANY (A) RARE   WBC, UA 11-20 <3 WBC/hpf   RBC / HPF 3-6 <3 RBC/hpf   Bacteria, UA MANY (A) RARE   Urine-Other MUCOUS PRESENT   Pregnancy, urine     Status: None   Collection Time: 09/03/14  1:13 AM  Result Value Ref Range   Preg Test, Ur NEGATIVE NEGATIVE  Lactic acid, plasma     Status: None   Collection Time: 09/03/14  2:09 AM  Result Value Ref Range   Lactic Acid, Venous 1.5 0.5 - 2.0 mmol/L    Ct Abdomen Pelvis W  Contrast  09/03/2014   CLINICAL DATA:  Abdominal pain, nausea and vomiting. Right and left lower quadrant pain with frequent vomiting since yesterday. Post colectomy 10 days prior 08/24/2014.  EXAM: CT ABDOMEN AND PELVIS WITH CONTRAST  TECHNIQUE: Multidetector CT imaging of the abdomen and pelvis was performed using the standard protocol following bolus administration of intravenous contrast.  CONTRAST:  41m OMNIPAQUE IOHEXOL 300 MG/ML SOLN, 1096mOMNIPAQUE IOHEXOL 300 MG/ML SOLN  COMPARISON:  Abdominal radiographs earlier this day.  FINDINGS: The included lung bases are clear.  As seen on prior radiographs there is moderate volume of free intra-abdominal air primarily in the upper abdomen. There is a moderate volume of intra-abdominal ascites. The stomach is distended with ingested oral contrast. There is diffuse fluid-filled dilated small bowel throughout. Distal small bowel with less dilated, however has bowel wall thickening and mild enhancement. There is no transition point. The ileal colonic anastomosis in the right lower quadrant is widely patent. The in situ sigmoid colon is fluid-filled. There is no definite air adjacent to the enteric suture line. There is no loculated abscess. There is no pneumatosis or portal venous gas.  The liver, gallbladder, spleen, pancreas, adrenal glands, and kidneys are normal. Abdominal aorta is normal in caliber.  Within the pelvis the bladder is physiologically distended. Uterus and adnexa are normal for age.  Midline abdominal skin staples noted. No subcutaneous fluid collection.  Postsurgical change in the lumbosacral spine. No acute osseous abnormality.  IMPRESSION: Intra-abdominal free air and free fluid, this is greater than expected for 10 days post intra-abdominal surgery. Diffuse small bowel dilatation, with small bowel thickening and enhancement distally just proximal to the ileal colonic anastomosis. There is no extraluminal oral contrast. No loculated collection  to suggest abscess. A site of bowel perforation is not definitively seen.  These results were called by telephone at the time of interpretation on 09/03/2014 at 4:09 am to Dr. IVRolland Porter who verbally acknowledged these results.   Electronically Signed   By: MeJeb Levering.D.   On: 09/03/2014 04:14   Dg Abd Acute W/chest  09/03/2014   CLINICAL DATA:  Generalized abdominal pain, vomiting, nausea, diarrhea and dizziness for 2 days. Recent colectomy. Initial encounter.  EXAM: ACUTE ABDOMEN SERIES (ABDOMEN 2 VIEW & CHEST 1 VIEW)  COMPARISON:  Lumbar spine radiographs from 07/08/2013  FINDINGS: Free intra-abdominal air is identified, more than expected 10 days status post colectomy.  Diffusely distended loops of small bowel are seen, measuring up to 5.1 cm in diameter, reflecting either ileus or some degree of small bowel obstruction. The remaining colon is not well characterized. Bowel suture lines are seen within the upper pelvis. Skin staples are seen overlying the pelvis. The patient is status post lumbar spinal fusion at L5-S1.  The lungs appear clear bilaterally. No focal consolidation, pleural effusion or pneumothorax is seen.  The cardiomediastinal silhouette is within normal limits. No acute osseous abnormalities are identified.  IMPRESSION: 1. Free intra-abdominal air seen, more than expected 10 days status post colectomy. CT of the abdomen and pelvis is recommended for further evaluation. 2. Diffusely distended loops of small bowel, measuring up to 5.1 cm in diameter, reflecting either ileus or some degree of small bowel obstruction. 3. No acute cardiopulmonary process seen.  Critical Value/emergent results were called by telephone at the time of interpretation on 09/03/2014 at 12:58 am to Dr. IVRolland Porterwho verbally acknowledged these results.   Electronically Signed   By: JeFrancoise Schaumann.  On: 09/03/2014 01:02    ROS: See chart Blood pressure 117/65, pulse 90, temperature 99.1 F (37.3 C),  temperature source Oral, resp. rate 20, height _0  (1.6 m), weight 58.968 kg (130 lb), last menstrual period 07/22/2011, SpO2 100 %. Physical Exam: Fatigued white female in no acute distress. Abdomen is soft and not rigid. Her incision is healed very well. Minimal bowel sounds are appreciated. Nonspecific tenderness is noted to deep palpation around the incision line. She is not distended.  Assessment/Plan: Impression: Dehydration and diarrhea. Will check C. difficile toxin. She does have renal insufficiency due to dehydration. I suspect she may have had some intra-abdominal bleeding from the surgery in the past which has caused the ascites and her body has not fully resorb this. She does not have an intra-abdominal abscess. She does not have an active anastomotic leak. The hospitalist will admit the patient and monitor her for 24 hours as I am being covered by [facility] surgical. She does not need acute surgical prevention. Agree with Cipro and Flagyl for antibiotic coverage. Would aggressively hydrate. I told the family she may need a blood transfusion once her dehydration has been treated. She will need an NG tube due to history of gastroparesis. Patient and husband are fully aware and all questions were answered.  Randall Colden A 09/03/2014, 5:23 AM

## 2014-09-04 LAB — COMPREHENSIVE METABOLIC PANEL
ALT: 20 U/L (ref 0–35)
ANION GAP: 7 (ref 5–15)
AST: 17 U/L (ref 0–37)
Albumin: 2.7 g/dL — ABNORMAL LOW (ref 3.5–5.2)
Alkaline Phosphatase: 36 U/L — ABNORMAL LOW (ref 39–117)
BUN: 17 mg/dL (ref 6–23)
CHLORIDE: 112 mmol/L (ref 96–112)
CO2: 20 mmol/L (ref 19–32)
CREATININE: 0.81 mg/dL (ref 0.50–1.10)
Calcium: 8 mg/dL — ABNORMAL LOW (ref 8.4–10.5)
GFR calc Af Amer: >90 mL/min (ref >90)
GFR calc non Af Amer: 87 mL/min — ABNORMAL LOW (ref >90)
Glucose, Bld: 108 mg/dL — ABNORMAL HIGH (ref 70–99)
Potassium: 2.9 mmol/L — ABNORMAL LOW (ref 3.5–5.1)
Sodium: 139 mmol/L (ref 135–145)
Total Bilirubin: 0.7 mg/dL (ref 0.3–1.2)
Total Protein: 5.2 g/dL — ABNORMAL LOW (ref 6.0–8.3)

## 2014-09-04 LAB — CBC
HEMATOCRIT: 32 % — AB (ref 36.0–46.0)
Hemoglobin: 10.5 g/dL — ABNORMAL LOW (ref 12.0–15.0)
MCH: 29.9 pg (ref 26.0–34.0)
MCHC: 32.8 g/dL (ref 30.0–36.0)
MCV: 91.2 fL (ref 78.0–100.0)
Platelets: 401 10*3/uL — ABNORMAL HIGH (ref 150–400)
RBC: 3.51 MIL/uL — ABNORMAL LOW (ref 3.87–5.11)
RDW: 13.9 % (ref 11.5–15.5)
WBC: 10.3 10*3/uL (ref 4.0–10.5)

## 2014-09-04 LAB — CLOSTRIDIUM DIFFICILE BY PCR: CDIFFPCR: NEGATIVE

## 2014-09-04 LAB — MAGNESIUM: Magnesium: 1.8 mg/dL (ref 1.5–2.5)

## 2014-09-04 MED ORDER — ALUM & MAG HYDROXIDE-SIMETH 200-200-20 MG/5ML PO SUSP
30.0000 mL | ORAL | Status: DC | PRN
  Administered 2014-09-05 – 2014-09-06 (×2): 30 mL via ORAL
  Filled 2014-09-04 (×2): qty 30

## 2014-09-04 MED ORDER — DEXTROSE IN LACTATED RINGERS 5 % IV SOLN
INTRAVENOUS | Status: DC
  Administered 2014-09-04 – 2014-09-07 (×7): via INTRAVENOUS

## 2014-09-04 MED ORDER — POTASSIUM CHLORIDE 10 MEQ/100ML IV SOLN
10.0000 meq | INTRAVENOUS | Status: AC
  Administered 2014-09-04 (×6): 10 meq via INTRAVENOUS
  Filled 2014-09-04 (×7): qty 100

## 2014-09-04 MED ORDER — MENTHOL 3 MG MT LOZG: 1.0000 | LOZENGE | OROMUCOSAL | Status: DC | PRN

## 2014-09-04 NOTE — Progress Notes (Signed)
Paged midlevel  Fredirick Maudlin with patient temp increasing to 101.6; patient had po and rectal Tylenol ordered, currently patient is suffering from N/V with an NGT to LIWS and diarrhea; orders received for IV Tylenol

## 2014-09-04 NOTE — Progress Notes (Signed)
Subjective: Patient is fatigued but denies any significant abdominal pain. Complains of throat pain from the NG tube.  Objective: Vital signs in last 24 hours: Temp:  [98.4 F (36.9 C)-100.6 F (38.1 C)] 98.4 F (36.9 C) (03/13 0832) Pulse Rate:  [78-96] 79 (03/13 0813) Resp:  [14-25] 15 (03/13 0813) BP: (101-138)/(51-76) 105/51 mmHg (03/13 0813) SpO2:  [96 %-100 %] 100 % (03/13 0813) Weight:  [63 kg (138 lb 14.2 oz)] 63 kg (138 lb 14.2 oz) (03/13 0500) Last BM Date: 09/04/14  Intake/Output from previous day: 03/12 0701 - 03/13 0700 In: 3929.2 [I.V.:2929.2; IV Piggyback:1000] Out: 1250 [Urine:100; Emesis/NG output:1150] Intake/Output this shift: Total I/O In: -  Out: 300 [Urine:300]  General appearance: cooperative, fatigued and no distress Resp: clear to auscultation bilaterally Cardio: regular rate and rhythm, S1, S2 normal, no murmur, click, rub or gallop GI: Soft. Incision healing well. No rigidity noted. No distention noted.  Lab Results:   Recent Labs  09/03/14 0806 09/04/14 0508  WBC 7.1 10.3  HGB 11.8* 10.5*  HCT 34.9* 32.0*  PLT 430* 401*   BMET  Recent Labs  09/02/14 2316 09/03/14 0806 09/04/14 0508  NA 137  --  139  K 4.1  --  2.9*  CL 97  --  112  CO2 23  --  20  GLUCOSE 114*  --  108*  BUN 25*  --  17  CREATININE 1.41* 0.79 0.81  CALCIUM 10.8*  --  8.0*   PT/INR No results for input(s): LABPROT, INR in the last 72 hours.  Studies/Results: Ct Abdomen Pelvis W Contrast  09/03/2014   CLINICAL DATA:  Abdominal pain, nausea and vomiting. Right and left lower quadrant pain with frequent vomiting since yesterday. Post colectomy 10 days prior 08/24/2014.  EXAM: CT ABDOMEN AND PELVIS WITH CONTRAST  TECHNIQUE: Multidetector CT imaging of the abdomen and pelvis was performed using the standard protocol following bolus administration of intravenous contrast.  CONTRAST:  46mL OMNIPAQUE IOHEXOL 300 MG/ML SOLN, 152mL OMNIPAQUE IOHEXOL 300 MG/ML SOLN   COMPARISON:  Abdominal radiographs earlier this day.  FINDINGS: The included lung bases are clear.  As seen on prior radiographs there is moderate volume of free intra-abdominal air primarily in the upper abdomen. There is Tyler moderate volume of intra-abdominal ascites. The stomach is distended with ingested oral contrast. There is diffuse fluid-filled dilated small bowel throughout. Distal small bowel with less dilated, however has bowel wall thickening and mild enhancement. There is no transition point. The ileal colonic anastomosis in the right lower quadrant is widely patent. The in situ sigmoid colon is fluid-filled. There is no definite air adjacent to the enteric suture line. There is no loculated abscess. There is no pneumatosis or portal venous gas.  The liver, gallbladder, spleen, pancreas, adrenal glands, and kidneys are normal. Abdominal aorta is normal in caliber.  Within the pelvis the bladder is physiologically distended. Uterus and adnexa are normal for age.  Midline abdominal skin staples noted. No subcutaneous fluid collection.  Postsurgical change in the lumbosacral spine. No acute osseous abnormality.  IMPRESSION: Intra-abdominal free air and free fluid, this is greater than expected for 10 days post intra-abdominal surgery. Diffuse small bowel dilatation, with small bowel thickening and enhancement distally just proximal to the ileal colonic anastomosis. There is no extraluminal oral contrast. No loculated collection to suggest abscess. Tyler site of bowel perforation is not definitively seen.  These results were called by telephone at the time of interpretation on 09/03/2014 at 4:09 am  to Dr. Rolland Porter , who verbally acknowledged these results.   Electronically Signed   By: Jeb Levering M.D.   On: 09/03/2014 04:14   Dg Abd Acute W/chest  09/03/2014   CLINICAL DATA:  Generalized abdominal pain, vomiting, nausea, diarrhea and dizziness for 2 days. Recent colectomy. Initial encounter.  EXAM: ACUTE  ABDOMEN SERIES (ABDOMEN 2 VIEW & CHEST 1 VIEW)  COMPARISON:  Lumbar spine radiographs from 07/08/2013  FINDINGS: Free intra-abdominal air is identified, more than expected 10 days status post colectomy.  Diffusely distended loops of small bowel are seen, measuring up to 5.1 cm in diameter, reflecting either ileus or some degree of small bowel obstruction. The remaining colon is not well characterized. Bowel suture lines are seen within the upper pelvis. Skin staples are seen overlying the pelvis. The patient is status post lumbar spinal fusion at L5-S1.  The lungs appear clear bilaterally. No focal consolidation, pleural effusion or pneumothorax is seen.  The cardiomediastinal silhouette is within normal limits. No acute osseous abnormalities are identified.  IMPRESSION: 1. Free intra-abdominal air seen, more than expected 10 days status post colectomy. CT of the abdomen and pelvis is recommended for further evaluation. 2. Diffusely distended loops of small bowel, measuring up to 5.1 cm in diameter, reflecting either ileus or some degree of small bowel obstruction. 3. No acute cardiopulmonary process seen.  Critical Value/emergent results were called by telephone at the time of interpretation on 09/03/2014 at 12:58 am to Dr. Rolland Porter, who verbally acknowledged these results.   Electronically Signed   By: Garald Balding M.D.   On: 09/03/2014 01:02    Anti-infectives: Anti-infectives    Start     Dose/Rate Route Frequency Ordered Stop   09/03/14 1600  ciprofloxacin (CIPRO) IVPB 400 mg  Status:  Discontinued     400 mg 200 mL/hr over 60 Minutes Intravenous Every 12 hours 09/03/14 0926 09/04/14 1124   09/03/14 0800  metroNIDAZOLE (FLAGYL) IVPB 500 mg     500 mg 100 mL/hr over 60 Minutes Intravenous Every 8 hours 09/03/14 0652     09/03/14 0630  metroNIDAZOLE (FLAGYL) IVPB 500 mg  Status:  Discontinued     500 mg 100 mL/hr over 60 Minutes Intravenous Every 8 hours 09/03/14 0622 09/03/14 0652   09/03/14  0430  ciprofloxacin (CIPRO) IVPB 400 mg     400 mg 200 mL/hr over 60 Minutes Intravenous  Once 09/03/14 0419 09/03/14 0656   09/03/14 0430  clindamycin (CLEOCIN) IVPB 600 mg     600 mg 100 mL/hr over 30 Minutes Intravenous  Once 09/03/14 0419 09/03/14 0541      Assessment/Plan: Impression: Renal insufficiency secondary to dehydration has resolved. Hemoglobin is stable. She is having some diarrhea mixed in with her urine. This is secondary to her near total colectomy. She is hypokalemic and this will be treated. Will stop ciprofloxacin but continue Flagyl due to her diarrhea. I doubt she has had an anastomotic leak. We'll continue supportive care. Continue NG tube for another 24 hours.  LOS: 1 day    Alicia Tyler 09/04/2014

## 2014-09-05 LAB — BASIC METABOLIC PANEL
ANION GAP: 7 (ref 5–15)
BUN: 9 mg/dL (ref 6–23)
CO2: 22 mmol/L (ref 19–32)
Calcium: 8.2 mg/dL — ABNORMAL LOW (ref 8.4–10.5)
Chloride: 109 mmol/L (ref 96–112)
Creatinine, Ser: 0.55 mg/dL (ref 0.50–1.10)
GFR calc Af Amer: >90 mL/min (ref >90)
Glucose, Bld: 111 mg/dL — ABNORMAL HIGH (ref 70–99)
Potassium: 2.8 mmol/L — ABNORMAL LOW (ref 3.5–5.1)
SODIUM: 138 mmol/L (ref 135–145)

## 2014-09-05 LAB — CBC
HCT: 30.5 % — ABNORMAL LOW (ref 36.0–46.0)
Hemoglobin: 10.2 g/dL — ABNORMAL LOW (ref 12.0–15.0)
MCH: 30 pg (ref 26.0–34.0)
MCHC: 33.4 g/dL (ref 30.0–36.0)
MCV: 89.7 fL (ref 78.0–100.0)
Platelets: 418 10*3/uL — ABNORMAL HIGH (ref 150–400)
RBC: 3.4 MIL/uL — ABNORMAL LOW (ref 3.87–5.11)
RDW: 13.7 % (ref 11.5–15.5)
WBC: 9.6 10*3/uL (ref 4.0–10.5)

## 2014-09-05 MED ORDER — POTASSIUM CHLORIDE 10 MEQ/100ML IV SOLN
10.0000 meq | INTRAVENOUS | Status: AC
  Administered 2014-09-05 (×6): 10 meq via INTRAVENOUS
  Filled 2014-09-05 (×5): qty 100

## 2014-09-05 MED ORDER — PANTOPRAZOLE SODIUM 40 MG PO TBEC
40.0000 mg | DELAYED_RELEASE_TABLET | Freq: Every day | ORAL | Status: DC
  Administered 2014-09-05 – 2014-09-07 (×3): 40 mg via ORAL
  Filled 2014-09-05 (×3): qty 1

## 2014-09-05 MED ORDER — HYDROMORPHONE HCL 1 MG/ML IJ SOLN
1.0000 mg | INTRAMUSCULAR | Status: DC | PRN
  Administered 2014-09-05 – 2014-09-08 (×33): 1 mg via INTRAVENOUS
  Filled 2014-09-05 (×33): qty 1

## 2014-09-05 NOTE — Progress Notes (Signed)
  Subjective: Has had decreased appetite from NG tube. Is passing gas and having bowel movements.  Objective: Vital signs in last 24 hours: Temp:  [98.4 F (36.9 C)-99.4 F (37.4 C)] 98.8 F (37.1 C) (03/14 0631) Pulse Rate:  [72-84] 72 (03/14 0631) Resp:  [16-18] 18 (03/14 0631) BP: (105-129)/(55-73) 125/64 mmHg (03/14 0631) SpO2:  [98 %-100 %] 99 % (03/14 0631) Last BM Date: 09/05/14  Intake/Output from previous day: 03/13 0701 - 03/14 0700 In: -  Out: 850 [Urine:850] Intake/Output this shift:    General appearance: alert, cooperative and fatigued Resp: clear to auscultation bilaterally Cardio: regular rate and rhythm, S1, S2 normal, no murmur, click, rub or gallop GI: ft with bowel sounds present. No distention noted. Incision healing well.  Lab Results:   Recent Labs  09/04/14 0508 09/05/14 0654  WBC 10.3 9.6  HGB 10.5* 10.2*  HCT 32.0* 30.5*  PLT 401* 418*   BMET  Recent Labs  09/04/14 0508 09/05/14 0654  NA 139 138  K 2.9* 2.8*  CL 112 109  CO2 20 22  GLUCOSE 108* 111*  BUN 17 9  CREATININE 0.81 0.55  CALCIUM 8.0* 8.2*   PT/INR No results for input(s): LABPROT, INR in the last 72 hours.  Studies/Results: No results found.  Anti-infectives: Anti-infectives    Start     Dose/Rate Route Frequency Ordered Stop   09/03/14 1600  ciprofloxacin (CIPRO) IVPB 400 mg  Status:  Discontinued     400 mg 200 mL/hr over 60 Minutes Intravenous Every 12 hours 09/03/14 0926 09/04/14 1124   09/03/14 0800  metroNIDAZOLE (FLAGYL) IVPB 500 mg     500 mg 100 mL/hr over 60 Minutes Intravenous Every 8 hours 09/03/14 0652     09/03/14 0630  metroNIDAZOLE (FLAGYL) IVPB 500 mg  Status:  Discontinued     500 mg 100 mL/hr over 60 Minutes Intravenous Every 8 hours 09/03/14 0622 09/03/14 0652   09/03/14 0430  ciprofloxacin (CIPRO) IVPB 400 mg     400 mg 200 mL/hr over 60 Minutes Intravenous  Once 09/03/14 0419 09/03/14 0656   09/03/14 0430  clindamycin (CLEOCIN) IVPB  600 mg     600 mg 100 mL/hr over 30 Minutes Intravenous  Once 09/03/14 0419 09/03/14 0541      Assessment/Plan: Impression: Postoperative ileus slowly resolving. Hemoglobin stable. Still with hypokalemia. Plan: We will remove gastric tube and advance diet. Will supplement potassium. We'll try to get patient out of bed.  LOS: 2 days    Michelle Wnek A 09/05/2014

## 2014-09-05 NOTE — Care Management Note (Addendum)
    Page 1 of 1   09/08/2014     9:18:20 AM CARE MANAGEMENT NOTE 09/08/2014  Patient:  Alicia Tyler, Alicia Tyler   Account Number:  [id number]  Date Initiated:  09/05/2014  Documentation initiated by:  Theophilus Kinds  Subjective/Objective Assessment:   Pt admitted from home with nausea, vomiting. Pt lives with her husband and will return home at discharge. Pt is independent with ADL's.     Action/Plan:   No CM needs noted.   Anticipated DC Date:  09/08/2014   Anticipated DC Plan:  [place]  CM consult      Choice offered to / List presented to:             Status of service:  Completed, signed off Medicare Important Message given?   (If response is "NO", the following Medicare IM given date fields will be blank) Date Medicare IM given:   Medicare IM given by:   Date Additional Medicare IM given:   Additional Medicare IM given by:    Discharge Disposition:  HOME/SELF CARE  Per UR Regulation:    If discussed at Long Length of Stay Meetings, dates discussed:    Comments:  09/08/14 0915 Christinia Gully, RN BSN CM Pt discharged home today. No CM needs noted. 09/05/14 Asher, RN BSN CM

## 2014-09-05 NOTE — Progress Notes (Signed)
UR chart review completed.  

## 2014-09-06 NOTE — Progress Notes (Signed)
  Subjective: Patient feels better. She is less fatigued. She has had multiple bowel movements. They are green and soft.  Objective: Vital signs in last 24 hours: Temp:  [97.7 F (36.5 C)-98.6 F (37 C)] 97.7 F (36.5 C) (03/15 0621) Pulse Rate:  [60-69] 61 (03/15 0621) Resp:  [18] 18 (03/15 0621) BP: (91-128)/(59-85) 91/62 mmHg (03/15 0621) SpO2:  [98 %-100 %] 99 % (03/15 0621) Last BM Date: 09/05/14  Intake/Output from previous day: 03/14 0701 - 03/15 0700 In: 120 [P.O.:120] Out: -  Intake/Output this shift:    General appearance: alert, cooperative and no distress Resp: clear to auscultation bilaterally Cardio: regular rate and rhythm, S1, S2 normal, no murmur, click, rub or gallop GI: Soft. Bowel sounds active. Incision healing well. No distention noted.  Lab Results:   Recent Labs  09/04/14 0508 09/05/14 0654  WBC 10.3 9.6  HGB 10.5* 10.2*  HCT 32.0* 30.5*  PLT 401* 418*   BMET  Recent Labs  09/04/14 0508 09/05/14 0654  NA 139 138  K 2.9* 2.8*  CL 112 109  CO2 20 22  GLUCOSE 108* 111*  BUN 17 9  CREATININE 0.81 0.55  CALCIUM 8.0* 8.2*   PT/INR No results for input(s): LABPROT, INR in the last 72 hours.  Studies/Results: No results found.  Anti-infectives: Anti-infectives    Start     Dose/Rate Route Frequency Ordered Stop   09/03/14 1600  ciprofloxacin (CIPRO) IVPB 400 mg  Status:  Discontinued     400 mg 200 mL/hr over 60 Minutes Intravenous Every 12 hours 09/03/14 0926 09/04/14 1124   09/03/14 0800  metroNIDAZOLE (FLAGYL) IVPB 500 mg     500 mg 100 mL/hr over 60 Minutes Intravenous Every 8 hours 09/03/14 0652     09/03/14 0630  metroNIDAZOLE (FLAGYL) IVPB 500 mg  Status:  Discontinued     500 mg 100 mL/hr over 60 Minutes Intravenous Every 8 hours 09/03/14 0622 09/03/14 0652   09/03/14 0430  ciprofloxacin (CIPRO) IVPB 400 mg     400 mg 200 mL/hr over 60 Minutes Intravenous  Once 09/03/14 0419 09/03/14 0656   09/03/14 0430  clindamycin  (CLEOCIN) IVPB 600 mg     600 mg 100 mL/hr over 30 Minutes Intravenous  Once 09/03/14 0419 09/03/14 0541      Assessment/Plan: Impression: Nausea, vomiting, and diarrhea resolving. Plan: We'll advance to regular diet. Will check potassium and hemoglobin in a.m. Have encouraged patient to ambulate.  LOS: 3 days    Kaelei Wheeler A 09/06/2014

## 2014-09-07 LAB — BASIC METABOLIC PANEL
ANION GAP: 6 (ref 5–15)
BUN: <5 mg/dL — ABNORMAL LOW (ref 6–23)
CO2: 27 mmol/L (ref 19–32)
Calcium: 8.4 mg/dL (ref 8.4–10.5)
Chloride: 107 mmol/L (ref 96–112)
Creatinine, Ser: 0.69 mg/dL (ref 0.50–1.10)
GFR calc Af Amer: >90 mL/min (ref >90)
GFR calc non Af Amer: >90 mL/min (ref >90)
GLUCOSE: 110 mg/dL — AB (ref 70–99)
Potassium: 3.1 mmol/L — ABNORMAL LOW (ref 3.5–5.1)
Sodium: 140 mmol/L (ref 135–145)

## 2014-09-07 LAB — CBC
HEMATOCRIT: 34 % — AB (ref 36.0–46.0)
HEMOGLOBIN: 11.1 g/dL — AB (ref 12.0–15.0)
MCH: 29.4 pg (ref 26.0–34.0)
MCHC: 32.6 g/dL (ref 30.0–36.0)
MCV: 90.2 fL (ref 78.0–100.0)
Platelets: 456 10*3/uL — ABNORMAL HIGH (ref 150–400)
RBC: 3.77 MIL/uL — ABNORMAL LOW (ref 3.87–5.11)
RDW: 13.7 % (ref 11.5–15.5)
WBC: 8 10*3/uL (ref 4.0–10.5)

## 2014-09-07 LAB — MAGNESIUM: MAGNESIUM: 1.7 mg/dL (ref 1.5–2.5)

## 2014-09-07 MED ORDER — ALPRAZOLAM 0.5 MG PO TABS
0.5000 mg | ORAL_TABLET | Freq: Two times a day (BID) | ORAL | Status: DC | PRN
  Administered 2014-09-07 (×2): 0.5 mg via ORAL
  Filled 2014-09-07 (×2): qty 1

## 2014-09-07 MED ORDER — POTASSIUM CHLORIDE 10 MEQ/100ML IV SOLN
10.0000 meq | INTRAVENOUS | Status: AC
  Administered 2014-09-07 (×6): 10 meq via INTRAVENOUS
  Filled 2014-09-07: qty 100

## 2014-09-07 NOTE — Progress Notes (Signed)
  Subjective: Patient had 1 bowel movement yesterday. Her appetite is decreased, but no nausea or vomiting noted. Is very anxious about having staples removed.  Objective: Vital signs in last 24 hours: Temp:  [98.3 F (36.8 C)-98.6 F (37 C)] 98.6 F (37 C) (03/15 2307) Pulse Rate:  [66-81] 81 (03/15 2307) Resp:  [18-20] 20 (03/15 2307) BP: (104-123)/(70-84) 123/84 mmHg (03/15 2307) SpO2:  [99 %-100 %] 100 % (03/15 2307) Last BM Date: 09/07/14  Intake/Output from previous day: 03/15 0701 - 03/16 0700 In: 6604.6 [P.O.:360; I.V.:5344.6; IV Piggyback:900] Out: -  Intake/Output this shift:    General appearance: alert, cooperative and Anxious Resp: clear to auscultation bilaterally Cardio: regular rate and rhythm, S1, S2 normal, no murmur, click, rub or gallop GI: Soft, incision healing well. Abdominal wall ecchymosis present. Staples removed, Steri-Strips applied.  Lab Results:   Recent Labs  09/05/14 0654 09/07/14 0654  WBC 9.6 8.0  HGB 10.2* 11.1*  HCT 30.5* 34.0*  PLT 418* 456*   BMET  Recent Labs  09/05/14 0654 09/07/14 0654  NA 138 140  K 2.8* 3.1*  CL 109 107  CO2 22 27  GLUCOSE 111* 110*  BUN 9 <5*  CREATININE 0.55 0.69  CALCIUM 8.2* 8.4   PT/INR No results for input(s): LABPROT, INR in the last 72 hours.  Studies/Results: No results found.  Anti-infectives: Anti-infectives    Start     Dose/Rate Route Frequency Ordered Stop   09/03/14 1600  ciprofloxacin (CIPRO) IVPB 400 mg  Status:  Discontinued     400 mg 200 mL/hr over 60 Minutes Intravenous Every 12 hours 09/03/14 0926 09/04/14 1124   09/03/14 0800  metroNIDAZOLE (FLAGYL) IVPB 500 mg     500 mg 100 mL/hr over 60 Minutes Intravenous Every 8 hours 09/03/14 0652     09/03/14 0630  metroNIDAZOLE (FLAGYL) IVPB 500 mg  Status:  Discontinued     500 mg 100 mL/hr over 60 Minutes Intravenous Every 8 hours 09/03/14 0622 09/03/14 0652   09/03/14 0430  ciprofloxacin (CIPRO) IVPB 400 mg     400  mg 200 mL/hr over 60 Minutes Intravenous  Once 09/03/14 0419 09/03/14 0656   09/03/14 0430  clindamycin (CLEOCIN) IVPB 600 mg     600 mg 100 mL/hr over 30 Minutes Intravenous  Once 09/03/14 0419 09/03/14 0541      Assessment/Plan: Impression: Nausea, vomiting, diarrhea seemed to have resolved. Patient still has hypokalemia. We will give her IV potassium supplementation as I doubt she can tolerate by mouth potassium. Hemoglobin is stable. Anticipate discharge in next 24-48 hours.  LOS: 4 days    Enzio Buchler A 09/07/2014

## 2014-09-08 MED ORDER — ALPRAZOLAM 0.5 MG PO TABS: 0.5000 mg | ORAL_TABLET | Freq: Every day | ORAL | Status: DC

## 2014-09-08 MED ORDER — OMEPRAZOLE 20 MG PO CPDR: 20.0000 mg | DELAYED_RELEASE_CAPSULE | Freq: Every day | ORAL | Status: DC

## 2014-09-08 NOTE — Progress Notes (Signed)
Patient discharged home this morning.  Patient was given discharge instructions, prescriptions, and care notes.  Patient verbalized understanding with no complaints or concerns voiced at this time.  IV was removed with catheter intact, no bleeding or complications.  Patient left unit in stable condition with all of her belongings, by a staff member in a wheelchair.

## 2014-09-08 NOTE — Discharge Summary (Signed)
Physician Discharge Summary  Patient ID: Alicia Tyler MRN: [id number] DOB/AGE: [birth year] 44 y.o.  Admit date: 09/02/2014 Discharge date: 09/08/2014  Admission Diagnoses: Nausea, vomiting, diarrhea, hypokalemia, renal insufficiency, dehydration  Discharge Diagnoses: Same Principal Problem:   ARF (acute renal failure) Active Problems:   Free intraperitoneal air   Nausea and vomiting   UTI (urinary tract infection)   Generalized abdominal pain   Discharged Condition: good  Hospital Course: Patient is a 44 year old white female status post a near total colectomy on 08/23/2012 who presented to the hospital with nausea, vomiting, diarrhea, renal insufficiency, and hypokalemia. CT scan of the abdomen revealed fluid within the peritoneum along with some free air, though clinically the patient had no signs of peritonitis. This was felt to be secondary to old blood. No anastomotic leak was noted. She was started on Flagyl for diarrhea, although her C. difficile toxin was negative. She was admitted to the hospital for further evaluation and treatment. Her hypokalemia was treated. Her renal insufficiency was treated easily with fluids. She did not require surgical intervention. Her diet was advanced without difficulty once her bowel function returned. She is being discharged home on 09/08/2014 in good improving condition.  Discharge Exam: Blood pressure 116/72, pulse 72, temperature 98.6 F (37 C), temperature source Oral, resp. rate 20, height 5\' 3"  (1.6 m), weight 63 kg (138 lb 14.2 oz), last menstrual period 07/22/2011, SpO2 97 %. General appearance: alert, cooperative and no distress Resp: clear to auscultation bilaterally Cardio: regular rate and rhythm, S1, S2 normal, no murmur, click, rub or gallop GI: Soft. Incision healing well. No rigidity noted.  Disposition: 01-Home or Self Care     Medication List    STOP taking these medications        furosemide 20 MG tablet  Commonly known  as:  LASIX     LINZESS 290 MCG Caps capsule  Generic drug:  Linaclotide      TAKE these medications        ALPRAZolam 0.5 MG tablet  Commonly known as:  XANAX  Take 1 tablet (0.5 mg total) by mouth daily.     ALREX 0.2 % Susp  Generic drug:  loteprednol  Place 1 drop into both eyes 2 (two) times daily.     AMITIZA 24 MCG capsule  Generic drug:  lubiprostone  Take 1 capsule (24 mcg total) by mouth 2 (two) times daily with a meal.     Biotin 5000 MCG Caps  Take 5,000 mcg by mouth 2 (two) times daily.     cycloSPORINE 0.05 % ophthalmic emulsion  Commonly known as:  RESTASIS  Place 1 drop into both eyes 2 (two) times daily.     diazepam 10 MG tablet  Commonly known as:  VALIUM  Take 1 tablet (10 mg total) by mouth every 8 (eight) hours as needed (muscle spasms).     dicyclomine 10 MG capsule  Commonly known as:  BENTYL  1 po AT 8 AM, 12 NOON, AND 4 PM FOR 3 DAYS THEN 1 PO AT 8 AM AND 4 PM FOR 4 DAYS THEN USE AS NEEDED FOR ABDOMINAL PAIN/DIARRHEA.     DOCQLACE 100 MG capsule  Generic drug:  docusate sodium  TAKE 1 CAPSULE BY MOUTH THREE TIMES DAILY     FIBER SELECT GUMMIES PO  Take 2 tablets by mouth 2 (two) times daily.     gabapentin 300 MG capsule  Commonly known as:  NEURONTIN  Take 300 mg by mouth 3 (three)  times daily.     loratadine 10 MG tablet  Commonly known as:  CLARITIN  Take 10 mg by mouth daily.     meloxicam 15 MG tablet  Commonly known as:  MOBIC  Take 15 mg by mouth daily.     Olopatadine HCl 0.2 % Soln  Place 1 drop into both eyes 2 (two) times daily.     omeprazole 20 MG capsule  Commonly known as:  PRILOSEC  Take 1 capsule (20 mg total) by mouth daily.     oxyCODONE 15 MG immediate release tablet  Commonly known as:  ROXICODONE  Take 15 mg by mouth every 4 (four) hours as needed for pain.     OxyCODONE 15 mg T12a 12 hr tablet  Commonly known as:  OXYCONTIN  Take 1 tablet (15 mg total) by mouth every 12 (twelve) hours.     SUMAtriptan  100 MG tablet  Commonly known as:  IMITREX  Take 100 mg by mouth every 2 (two) hours as needed for migraine or headache. May repeat in 2 hours if headache persists or recurs.     topiramate 50 MG tablet  Commonly known as:  TOPAMAX  Take 50 mg by mouth 2 (two) times daily.     Vitamin D 2000 UNITS tablet  Take 2,000 Units by mouth daily.           Follow-up Information    Follow up with Jamesetta So, MD. Schedule an appointment as soon as possible for a visit on 09/15/2014.   Specialty:  General Surgery   Contact information:   [address] [address] [phone]       Signed: Aviva Signs A 09/08/2014, 9:04 AM

## 2014-09-08 NOTE — Progress Notes (Signed)
UR chart review completed.  

## 2014-09-08 NOTE — Discharge Instructions (Signed)

## 2014-09-14 ENCOUNTER — Encounter: Payer: Self-pay | Admitting: Gastroenterology

## 2014-11-16 ENCOUNTER — Other Ambulatory Visit: Payer: Self-pay | Admitting: Nurse Practitioner

## 2014-11-16 ENCOUNTER — Other Ambulatory Visit: Payer: Self-pay | Admitting: Neurosurgery

## 2014-11-16 DIAGNOSIS — R921 Mammographic calcification found on diagnostic imaging of breast: Secondary | ICD-10-CM

## 2014-11-16 DIAGNOSIS — M5416 Radiculopathy, lumbar region: Secondary | ICD-10-CM

## 2014-11-17 ENCOUNTER — Encounter: Payer: Self-pay | Admitting: General Practice

## 2014-11-22 ENCOUNTER — Ambulatory Visit
Admission: RE | Admit: 2014-11-22 | Discharge: 2014-11-22 | Disposition: A | Discharge status: Home / Self Care | Payer: Medicaid Other | Source: Ambulatory Visit | Attending: Neurosurgery | Admitting: Neurosurgery

## 2014-11-22 DIAGNOSIS — M5416 Radiculopathy, lumbar region: Secondary | ICD-10-CM

## 2014-11-22 MED ORDER — ONDANSETRON HCL 4 MG/2ML IJ SOLN: 4.0000 mg | Freq: Four times a day (QID) | INTRAMUSCULAR | Status: DC | PRN

## 2014-11-22 MED ORDER — DIAZEPAM 5 MG PO TABS
10.0000 mg | ORAL_TABLET | Freq: Once | ORAL | Status: AC
Start: 2014-11-22 — End: 2014-11-22
  Administered 2014-11-22: 10 mg via ORAL

## 2014-11-22 MED ORDER — IOHEXOL 180 MG/ML  SOLN
15.0000 mL | Freq: Once | INTRAMUSCULAR | Status: AC | PRN
  Administered 2014-11-22: 15 mL via INTRATHECAL

## 2014-11-22 NOTE — Discharge Instructions (Signed)
Myelogram Discharge Instructions  1. Go home and rest quietly for the next 24 hours.  It is important to lie flat for the next 24 hours.  Get up only to go to the restroom.  You may lie in the bed or on a couch on your back, your stomach, your left side or your right side.  You may have one pillow under your head.  You may have pillows between your knees while you are on your side or under your knees while you are on your back.  2. DO NOT drive today.  Recline the seat as far back as it will go, while still wearing your seat belt, on the way home.  3. You may get up to go to the bathroom as needed.  You may sit up for 10 minutes to eat.  You may resume your normal diet and medications unless otherwise indicated.  Drink lots of extra fluids today and tomorrow.  4. The incidence of headache, nausea, or vomiting is about 5% (one in 20 patients).  If you develop a headache, lie flat and drink plenty of fluids until the headache goes away.  Caffeinated beverages may be helpful.  If you develop severe nausea and vomiting or a headache that does not go away with flat bed rest, call [phone].  5. You may resume normal activities after your 24 hours of bed rest is over; however, do not exert yourself strongly or do any heavy lifting tomorrow. If when you get up you have a headache when standing, go back to bed and force fluids for another 24 hours.  6. Call your physician for a follow-up appointment.  The results of your myelogram will be sent directly to your physician by the following day.  7. If you have any questions or if complications develop after you arrive home, please call [phone].  Discharge instructions have been explained to the patient.  The patient, or the person responsible for the patient, fully understands these instructions.       May resume Sumatriptan on November 23, 2014, after 9:30 am.

## 2014-11-22 NOTE — Progress Notes (Signed)
Pt states she has been off Sumatriptan for the past 2 days.  Discharge instructions explained to pt and her husband

## 2014-11-23 ENCOUNTER — Ambulatory Visit
Admission: RE | Admit: 2014-11-23 | Discharge: 2014-11-23 | Disposition: A | Discharge status: Home / Self Care | Payer: Medicaid Other | Source: Ambulatory Visit | Attending: Nurse Practitioner | Admitting: Nurse Practitioner

## 2014-11-23 DIAGNOSIS — R921 Mammographic calcification found on diagnostic imaging of breast: Secondary | ICD-10-CM

## 2014-12-28 ENCOUNTER — Ambulatory Visit: Payer: Medicaid Other | Admitting: Gastroenterology

## 2014-12-29 ENCOUNTER — Ambulatory Visit: Payer: Medicaid Other | Admitting: Gastroenterology

## 2015-02-16 ENCOUNTER — Encounter: Payer: Self-pay | Admitting: Gastroenterology

## 2015-02-16 ENCOUNTER — Ambulatory Visit (INDEPENDENT_AMBULATORY_CARE_PROVIDER_SITE_OTHER): Payer: Medicaid Other | Admitting: Gastroenterology

## 2015-02-16 VITALS — BP 115/76 | HR 89 | Temp 98.4°F | Ht 63.0 in | Wt 128.8 lb

## 2015-02-16 DIAGNOSIS — K589 Irritable bowel syndrome without diarrhea: Secondary | ICD-10-CM

## 2015-02-16 DIAGNOSIS — K3184 Gastroparesis: Secondary | ICD-10-CM

## 2015-02-16 DIAGNOSIS — K219 Gastro-esophageal reflux disease without esophagitis: Secondary | ICD-10-CM | POA: Diagnosis not present

## 2015-02-16 DIAGNOSIS — D126 Benign neoplasm of colon, unspecified: Secondary | ICD-10-CM

## 2015-02-16 NOTE — Assessment & Plan Note (Signed)
NO WARNING SIGNS/SYMPTOMS-S/P SUBTOTAL COLECTOMY  FLEX SIG DEC 2016. DISCUSSED PROCEDURE, BENEFITS, & RISKS: < 1% chance of medication reaction, OR bleeding.

## 2015-02-16 NOTE — Patient Instructions (Addendum)
DRINK WATER TO KEEP YOUR URINE LIGHT YELLOW.  FOLLOW A LOW FAT DIET. MEATS SHOULD BE BAKED, BROILED, OR BOILED.  CONTINUE OMEPRAZOLE.  TAKE 30 MINUTES PRIOR TO YOUR FIRST MEAL.  RE-START FIBER GUMMIES TO REDUCE WATERY DIARRHEA.  FOLLOW UP IN 6 MOS. MERRY CHRISTMAS AND HAPPY NEW YEAR!

## 2015-02-16 NOTE — Assessment & Plan Note (Addendum)
CLINICALLY IMPROVED AFTER SUBTOTAL COLECTOMY  DRINK WATER ADD FIBER GUMMIES EXPLAINED TO PT BEST TO STOP LINZESS, BUT IT HELPS BOWELS MOVE. CONTINUE LINZESS. FOLLOW UP IN 6 MOS. MERRY CHRISTMAS AND HAPPY NEW YEAR!

## 2015-02-16 NOTE — Progress Notes (Signed)
ON RECALL  °

## 2015-02-16 NOTE — Assessment & Plan Note (Signed)
SYMPTOMS CONTROLLED/RESOLVED.  CONTINUE OMEPRAZOLE.  TAKE 30 MINUTES PRIOR TO YOUR FIRST MEAL. 

## 2015-02-16 NOTE — Assessment & Plan Note (Signed)
SYMPTOMS CONTROLLED/RESOLVED.  CONTINUE TO MONITOR SYMPTOMS. FOLLOW UP IN 6 MOS.  

## 2015-02-16 NOTE — Progress Notes (Signed)
CC'ED TO PCP 

## 2015-02-16 NOTE — Progress Notes (Signed)
Subjective:    Patient ID: Alicia Tyler, female    DOB: March 28, 1971, 44 y.o.   MRN: [id number]  [facility], MD   HPI HAD TOTAL COLECTOMY MAR 2016 POSTOP COURSE COMPLICATED BY ACUTE RENAL FAILURE/HOSPITAL STAY. DOING BETTER NOW. BMs: TAKES LINZESS BECAUSE STILL TAKING ROXICODONE(4/DAY). BMs: Q2X/DAY (WATERY).  IF DOESN'T TAKE LINZESS GETS ABDOMINAL PAIN AND BLOATED.   PT DENIES FEVER, CHILLS, HEMATOCHEZIA, nausea, vomiting, melena, CHEST PAIN, SHORTNESS OF BREATH,  CHANGE IN BOWEL IN HABITS, abdominal pain, problems swallowing, OR heartburn or indigestion.   Past Medical History  Diagnosis Date  . Seasonal allergies   . BMI (body mass index) 20.0-29.01 Apr 2010 172 LBS  . 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 Headache(784.0)   . Anxiety   . PONV (postoperative nausea and vomiting)   . Chronic back pain   . Neuropathy   . Degenerative disc disease, thoracic    Past Surgical History  Procedure Laterality Date  . Tubal ligation    . Ovarian cyst removal    . Breast lumpectomy      from the left breast  . Colonoscopy  NOV 2011 SCREENING     HYPERPLASTIC RECTAL POLYP, SML IH, incomplete due to discomfort  . Colonoscopy  DEC 2011 w/ PROPOFOL    SERRATED ADENOMA, HYPERPLASTIC POLY[S[  . Knee surgery Bilateral     2 on righ-1 scope and one open; and one on left  . Ablation      endometrial ablation  . Mouth surgery  07/21/2013  . Smart pill procedure N/A 07/04/2014    Procedure: SMART PILL PROCEDURE;  Surgeon: Danie Binder, MD;  Location: [facility];  Service: Endoscopy;  Laterality: N/A;  800  . Back surgery  05/14/2011    Lumbar Fusion and cage  . Colectomy N/A 08/24/2014    Procedure: TOTAL COLECTOMY;  Surgeon: Jamesetta So, MD;  Location: [facility] ORS;  Service: General;  Laterality: N/A;    Allergies  Allergen Reactions  . Bee Venom Anaphylaxis       . Beesix [Pyridoxine Hcl] Anaphylaxis  . Penicillins Other (See Comments)    Makes hair fall out  . Aspirin Swelling  and Rash  . Codeine Nausea And Vomiting and Rash    Current Outpatient Prescriptions  Medication Sig Dispense Refill  . ALPRAZolam (XANAX) 0.5 MG tablet Take 1 tablet (0.5 mg total) by mouth daily.    . Biotin 5000 MCG CAPS Take 5,000 mcg by mouth 2 (two) times daily.      . Cholecalciferol (VITAMIN D) 2000 UNITS tablet Take 2,000 Units by mouth daily.      . cycloSPORINE (RESTASIS) 0.05 % ophthalmic emulsion Place 1 drop into both eyes 2 (two) times daily.    . diazepam (VALIUM) 10 MG tablet Take 1 tablet (10 mg total) by mouth every 8 (eight) hours as needed (muscle spasms).    . furosemide (LASIX) 20 MG tablet Take 20 mg by mouth.    . gabapentin (NEURONTIN) 300 MG capsule Take 300 mg by mouth 3 (three) times daily.    . Linaclotide (LINZESS) 290 MCG CAPS capsule Take 290 mcg by mouth daily.    Marland Kitchen loratadine (CLARITIN) 10 MG tablet Take 10 mg by mouth daily.      Marland Kitchen loteprednol (ALREX) 0.2 % SUSP Place 1 drop into both eyes 2 (two) times daily.    . meloxicam (MOBIC) 15 MG tablet Take 15 mg by mouth daily.    . Olopatadine  HCl 0.2 % SOLN Place 1 drop into both eyes 2 (two) times daily.    Marland Kitchen omeprazole (PRILOSEC) 20 MG capsule Take 1 capsule (20 mg total) by mouth daily.    Marland Kitchen oxyCODONE (ROXICODONE) 15 MG immediate release tablet Take 15 mg by mouth every 4 (four) hours as needed for pain.     . SUMAtriptan (IMITREX) 100 MG tablet Take 100 mg by mouth every 2 (two) hours as needed for migraine or headache. May repeat in 2 hours if headache persists or recurs.    . topiramate (TOPAMAX) 50 MG tablet Take 50 mg by mouth 2 (two) times daily.     .      .      .      Marland Kitchen FIBER SELECT GUMMIES PO Take 2 tablets by mouth 2 (two) times daily.  RARE   .        Review of Systems PER HPI OTHERWISE ALL SYSTEMS ARE NEGATIVE.     Objective:   Physical Exam  Constitutional: She is oriented to person, place, and time. She appears well-developed and well-nourished. No distress.  HENT:  Head:  Normocephalic and atraumatic.  Mouth/Throat: Oropharynx is clear and moist. No oropharyngeal exudate.  Eyes: Pupils are equal, round, and reactive to light. No scleral icterus.  Neck: Normal range of motion. Neck supple.  Cardiovascular: Normal rate, regular rhythm and normal heart sounds.   Pulmonary/Chest: Effort normal and breath sounds normal. No respiratory distress.  Abdominal: Soft. Bowel sounds are normal. She exhibits no distension. There is no tenderness.  Musculoskeletal: She exhibits no edema.  Lymphadenopathy:    She has no cervical adenopathy.  Neurological: She is alert and oriented to person, place, and time.  NO FOCAL DEFICITS   Psychiatric: She has a normal mood and affect.  Vitals reviewed.         Assessment & Plan:

## 2015-05-08 ENCOUNTER — Encounter: Payer: Self-pay | Admitting: Gastroenterology

## 2015-05-09 ENCOUNTER — Telehealth: Payer: Self-pay | Admitting: Gastroenterology

## 2015-05-09 NOTE — Telephone Encounter (Signed)
DEC RECALL FOR FLEX SIG

## 2015-05-11 ENCOUNTER — Telehealth: Payer: Self-pay

## 2015-05-11 NOTE — Telephone Encounter (Signed)
errror

## 2015-05-11 NOTE — Telephone Encounter (Signed)
Called and spoke with pt. She is coming in for an office visit on 05/31/2015 to get set up for Flex sig

## 2015-05-31 ENCOUNTER — Encounter: Payer: Self-pay | Admitting: Gastroenterology

## 2015-05-31 ENCOUNTER — Other Ambulatory Visit: Payer: Self-pay

## 2015-05-31 ENCOUNTER — Ambulatory Visit (INDEPENDENT_AMBULATORY_CARE_PROVIDER_SITE_OTHER): Payer: Medicaid Other | Admitting: Gastroenterology

## 2015-05-31 VITALS — BP 126/86 | HR 86 | Temp 97.3°F | Ht 63.0 in | Wt 143.8 lb

## 2015-05-31 DIAGNOSIS — K219 Gastro-esophageal reflux disease without esophagitis: Secondary | ICD-10-CM | POA: Diagnosis not present

## 2015-05-31 DIAGNOSIS — K3184 Gastroparesis: Secondary | ICD-10-CM | POA: Diagnosis not present

## 2015-05-31 DIAGNOSIS — R1310 Dysphagia, unspecified: Secondary | ICD-10-CM | POA: Diagnosis not present

## 2015-05-31 DIAGNOSIS — D126 Benign neoplasm of colon, unspecified: Secondary | ICD-10-CM

## 2015-05-31 NOTE — Assessment & Plan Note (Signed)
SYMPTOMS FAIRLY WELL CONTROLLED.  CONTINUE TO MONITOR SYMPTOMS. CONTINUE OMEPRAZOLE.  TAKE 30 MINUTES PRIOR TO YOUR FIRST MEAL.

## 2015-05-31 NOTE — Progress Notes (Signed)
cc'ed to pcp °

## 2015-05-31 NOTE — Assessment & Plan Note (Addendum)
NO WARNING SIGNS/SYMPTOMS  FLEX SIG DEC 2016 WITH MAC. DISCUSSED PROCEDURE, BENEFITS, & RISKS: < 1% chance of medication reaction, OR bleeding. USE FIBER GUMMIES 1-3 TIMES A DAY. AVOID HIGHER DOSES IF IT CAUSES BLOATING & GAS.

## 2015-05-31 NOTE — Assessment & Plan Note (Signed)
SYMPTOMS FAIRLY WELL CONTROLLED.  CONTINUE TO MONITOR SYMPTOMS. FOLLOW UP IN 6 MOS.  

## 2015-05-31 NOTE — Progress Notes (Signed)
Subjective:    Patient ID: Alicia Tyler, female    DOB: [birth year], 44 y.o.   MRN: [id number]  [facility], MD  HPI HERE TO DISCUSS FLEX SIG. GOT HOT FLASHES. RARE NAUSEA/VOMTIING/ BMs: VARIES BETWEEN CONSTIPATION AND DIARRHEA. RARE ABD PAIN WHEN SHE FEELS CONSTIPATED OR HAS HARD STOOL. HEARTBURN: RARE 1-2X/MO. HAD TO HAVE THE [place] AFTER CHICKEN x3. HARD TO SEDATE. SCREWS IN RIGHT BACK ARE BROKEN. LOVES TO GO TO CONCERTS. LAST ONE WAS BEST ONE: GARTH BROOKS/TRISHA YEARWOOD. HAS CHAIN ON RIGHT CHAIN DUE TO ATTENDING AN AC/DC CONCERT.  PT DENIES FEVER, CHILLS, HEMATOCHEZIA, HEMATEMESIS, melena, CHEST PAIN, SHORTNESS OF BREATH,  CHANGE IN BOWEL IN HABITS, problems with sedation, heartburn or indigestion.   Past Medical History  Diagnosis Date  . Seasonal allergies   . BMI (body mass index) 20.0-29.01 Apr 2010 172 LBS  . 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 Headache(784.0)   . Anxiety   . PONV (postoperative nausea and vomiting)   . Chronic back pain   . Neuropathy   . Degenerative disc disease, thoracic   . 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 Tubal ligation    . Ovarian cyst removal    . Breast lumpectomy      from the left breast  . Colonoscopy  NOV 2011 SCREENING     HYPERPLASTIC RECTAL POLYP, SML IH, incomplete due to discomfort  . Colonoscopy  DEC 2011 w/ PROPOFOL    SERRATED ADENOMA, HYPERPLASTIC POLY[S[  . Knee surgery Bilateral     2 on righ-1 scope and one open; and one on left  . Ablation      endometrial ablation  . Mouth surgery  07/21/2013  . Smart pill procedure N/A 07/04/2014    Procedure: SMART PILL PROCEDURE;  Surgeon: Danie Binder, MD;  Location: [facility];  Service: Endoscopy;  Laterality: N/A;  800  . Back surgery  05/14/2011    Lumbar Fusion and cage  . Colectomy N/A 08/24/2014    Procedure: TOTAL COLECTOMY;  Surgeon: Jamesetta So, MD;  Location: [facility];  Service: General;  Laterality: N/A;     Allergies  Allergen Reactions  . Bee Venom Anaphylaxis       . Beesix [Pyridoxine Hcl] Anaphylaxis  . Penicillins Other (See Comments)    Makes hair fall out  . Aspirin Swelling and Rash  . Codeine Nausea And Vomiting and Rash    Current Outpatient Prescriptions  Medication Sig Dispense Refill  . ALPRAZolam (XANAX) 0.5 MG tablet Take 1 tablet (0.5 mg total) by mouth daily. 50 tablet 0  . Biotin 5000 MCG CAPS Take 5,000 mcg by mouth 2 (two) times daily.      . Cholecalciferol (VITAMIN D) 2000 UNITS tablet Take 2,000 Units by mouth daily.      . cycloSPORINE (RESTASIS) 0.05 % ophthalmic emulsion Place 1 drop into both eyes 2 (two) times daily.    . diazepam (VALIUM) 10 MG tablet Take 1 tablet (10 mg total) by mouth every 8 (eight) hours as needed (muscle spasms).    . FIBER SELECT GUMMIES PO Take 2 tablets by mouth 2 (two) times daily.     . fluticasone (FLONASE) 50 MCG/ACT nasal spray Place 1 spray into both nostrils 2 (two) times daily.    . furosemide (LASIX) 20 MG tablet Take 20 mg by mouth.    . gabapentin (NEURONTIN) 300 MG capsule Take 300 mg by mouth 3 (  three) times daily.    . Linaclotide (LINZESS) 290 MCG CAPS capsule Take 290 mcg by mouth daily.    Marland Kitchen loratadine (CLARITIN) 10 MG tablet Take 10 mg by mouth daily.      Marland Kitchen loteprednol (ALREX) 0.2 % SUSP Place 1 drop into both eyes 2 (two) times daily.    . meloxicam (MOBIC) 15 MG tablet Take 15 mg by mouth daily.    . NON FORMULARY Compound nasal spray    . Olopatadine HCl 0.2 % SOLN Place 1 drop into both eyes 2 (two) times daily.    Marland Kitchen omeprazole (PRILOSEC) 20 MG capsule Take 1 capsule (20 mg total) by mouth daily.    Marland Kitchen oxyCODONE (ROXICODONE) 15 MG immediate release tablet Take 15 mg by mouth every 4 (four) hours as needed for pain.     . SUMAtriptan (IMITREX) 100 MG tablet Take 100 mg by mouth every 2 (two) hours as needed for migraine or headache. May repeat in 2 hours if headache persists or recurs.    . topiramate  (TOPAMAX) 50 MG tablet Take 50 mg by mouth 2 (two) times daily.     . OxyCODONE (OXYCONTIN) 15 mg T12A 12 hr tablet Take 1 tablet (15 mg total) by mouth every 12 (twelve) hours. (Patient not taking: Reported on 02/16/2015)      Review of Systems PER HPI OTHERWISE ALL SYSTEMS ARE NEGATIVE.    Objective:   Physical Exam  Constitutional: She is oriented to person, place, and time. She appears well-developed and well-nourished. No distress.  HENT:  Head: Normocephalic and atraumatic.  Mouth/Throat: Oropharynx is clear and moist. No oropharyngeal exudate.  Eyes: Pupils are equal, round, and reactive to light. No scleral icterus.  Neck: Normal range of motion. Neck supple.  Cardiovascular: Normal rate, regular rhythm and normal heart sounds.   Pulmonary/Chest: Effort normal and breath sounds normal. No respiratory distress.  Abdominal: Soft. Bowel sounds are normal. She exhibits no distension. There is no tenderness.  Musculoskeletal: She exhibits no edema.  Lymphadenopathy:    She has no cervical adenopathy.  Neurological: She is alert and oriented to person, place, and time.  NO FOCAL DEFICITS  Psychiatric: She has a normal mood and affect.  Vitals reviewed.     Assessment & Plan:

## 2015-05-31 NOTE — Assessment & Plan Note (Addendum)
LIKELY DUE TO PEPTIC STRICTURE. SYMPTOMS NOT CONTROLLED.  UPPER ENDOSCOPY TO DILATE YOUR ESOPHAGUS AND FLEX SIG WITH PROPOFOL DEC 29. DISCUSSED PROCEDURE, BENEFITS, & RISKS: < 1% chance of medication reaction, OR bleeding. DRINK WATER TO KEEP YOUR URINE LIGHT YELLOW. FOLLOW UP IN 6 MOS. MERRY CHRISTMAS.

## 2015-05-31 NOTE — Patient Instructions (Addendum)
UPPER ENDOSCOPY TO DILATE YOUR ESOPHAGUS AND FLEX SIG WITH PROPOFOL DEC 29.  DRINK WATER TO KEEP YOUR URINE LIGHT YELLOW.  CONTINUE FIBER POWDER, GUMMIES, OR 1 PACKET ONCE TO THREE TIMES A DAY. AVOID HIGHER DOSES IF IT CAUSES BLOATING & GAS.  CONTINUE OMEPRAZOLE.  TAKE 30 MINUTES PRIOR TO YOUR FIRST MEAL.  FOLLOW UP IN 6 MOS. MERRY CHRISTMAS.

## 2015-06-14 NOTE — Patient Instructions (Signed)
Alicia Tyler  06/14/2015     @PREFPERIOPPHARMACY @   Your procedure is scheduled on  06/22/2015   Report to [facility] at  700  A.M.  Call this number if you have problems the morning of surgery:  [phone]   Remember:  Do not eat food or drink liquids after midnight.  Take these medicines the morning of surgery with A SIP OF WATER  Xanax, valium, neurontin, claritin, mobic, prilosec, oxycodone, topamax, imitrex(if needed).   Do not wear jewelry, make-up or nail polish.  Do not wear lotions, powders, or perfumes.  You may wear deodorant.  Do not shave 48 hours prior to surgery.  Men may shave face and neck.  Do not bring valuables to the hospital.  [facility] is not responsible for any belongings or valuables.  Contacts, dentures or bridgework may not be worn into surgery.  Leave your suitcase in the car.  After surgery it may be brought to your room.  For patients admitted to the hospital, discharge time will be determined by your treatment team.  Patients discharged the day of surgery will not be allowed to drive home.   Name and phone number of your driver:   family Special instructions:  Follow the diet and prep instructions given to you by Dr Nona Dell office.  Please read over the following fact sheets that you were given. Pain Booklet, Coughing and Deep Breathing, Surgical Site Infection Prevention, Anesthesia Post-op Instructions and Care and Recovery After Surgery      Esophagogastroduodenoscopy Esophagogastroduodenoscopy (EGD) is a procedure that is used to examine the lining of the esophagus, stomach, and first part of the small intestine (duodenum). A long, flexible, lighted tube with a camera attached (endoscope) is inserted down the throat to view these organs. This procedure is done to detect problems or abnormalities, such as inflammation, bleeding, ulcers, or growths, in order to treat them. The procedure lasts 5-20 minutes. It is usually an  outpatient procedure, but it may need to be performed in a hospital in emergency cases. LET [facility] CARE PROVIDER KNOW ABOUT:  Any allergies you have.  All medicines you are taking, including vitamins, herbs, eye drops, creams, and over-the-counter medicines.  Previous problems you or members of your family have had with the use of anesthetics.  Any blood disorders you have.  Previous surgeries you have had.  Medical conditions you have. RISKS AND COMPLICATIONS Generally, this is a safe procedure. However, problems can occur and include:  Infection.  Bleeding.  Tearing (perforation) of the esophagus, stomach, or duodenum.  Difficulty breathing or not being able to breathe.  Excessive sweating.  Spasms of the larynx.  Slowed heartbeat.  Low blood pressure. BEFORE THE PROCEDURE  Do not eat or drink anything after midnight on the night before the procedure or as directed by your health care provider.  Do not take your regular medicines before the procedure if your health care provider asks you not to. Ask your health care provider about changing or stopping those medicines.  If you wear dentures, be prepared to remove them before the procedure.  Arrange for someone to drive you home after the procedure. PROCEDURE  A numbing medicine (local anesthetic) may be sprayed in your throat for comfort and to stop you from gagging or coughing.  You will have an IV tube inserted in a vein in your hand or arm. You will receive medicines and fluids through this tube.  You  will be given a medicine to relax you (sedative).  A pain reliever will be given through the IV tube.  A mouth guard may be placed in your mouth to protect your teeth and to keep you from biting on the endoscope.  You will be asked to lie on your left side.  The endoscope will be inserted down your throat and into your esophagus, stomach, and duodenum.  Air will be put through the endoscope to allow your  health care provider to clearly view the lining of your esophagus.  The lining of your esophagus, stomach, and duodenum will be examined. During the exam, your health care provider may:  Remove tissue to be examined under a microscope (biopsy) for inflammation, infection, or other medical problems.  Remove growths.  Remove objects (foreign bodies) that are stuck.  Treat any bleeding with medicines or other devices that stop tissues from bleeding (hot cautery, clipping devices).  Widen (dilate) or stretch narrowed areas of your esophagus and stomach.  The endoscope will be withdrawn. AFTER THE PROCEDURE  You will be taken to a recovery area for observation. Your blood pressure, heart rate, breathing rate, and blood oxygen level will be monitored often until the medicines you were given have worn off.  Do not eat or drink anything until the numbing medicine has worn off and your gag reflex has returned. You may choke.  Your health care provider should be able to discuss his or her findings with you. It will take longer to discuss the test results if any biopsies were taken.   This information is not intended to replace advice given to you by your health care provider. Make sure you discuss any questions you have with your health care provider.   Document Released: 10/11/2004 Document Revised: 07/01/2014 Document Reviewed: 05/13/2012 Elsevier Interactive Patient Education 2016 [place]. Flexible Sigmoidoscopy Flexible sigmoidoscopy is a procedure to check your lower colon (sigmoid colon). The procedure is done using a short, flexible tube that has a tiny camera attached (sigmoidoscope). The sigmoidoscope is inserted into your anus and passed through your rectum into your sigmoid colon. The camera on the scope sends images to a television monitor.  You may need this exam if you have had changes in your bowel habits, bleeding from your rectum, or abdominal pain. You may also need this  test if your health care provider wants to check for abnormal growths in your rectum or lower colon. LET [facility] CARE PROVIDER KNOW ABOUT:  Any allergies you have.  All medicines you are taking, including vitamins, herbs, eye drops, creams, and over-the-counter medicines.  Previous problems you or members of your family have had with the use of anesthetics.  Any blood disorders you have.  Previous surgeries you have had.  Medical conditions you have. RISKS AND COMPLICATIONS Generally, this is a safe procedure. However, as with any procedure, problems can occur. Possible problems include:  Abdominal pain.  Bleeding.  Infection or inflammation in your colon.  A tear through your rectum or colon (perforation). BEFORE THE PROCEDURE  You will need to follow a special diet and use a laxative or an enema before the procedure (bowel prep). This is to clean out your colon. You may need to start your bowel prep 1-3 days before the procedure. Follow your health care provider's instructions exactly.  Do not eat or drink anything after midnight the night before the procedure or as directed by your health care provider.  You may need to have  a rectal suppository or enema in the morning before your procedure.  Arrange for someone to take you home after the procedure. PROCEDURE  Sigmoidoscopy takes about 20 minutes and may include the following steps:  You may get a medicine through an IV tube to make you relaxed and drowsy (sedation).  You will lie on your side on the examination table.  The sigmoidoscope will be lubricated and gently inserted into your anus.  Air will be injected into your colon and the sigmoidoscope will be moved into your sigmoid colon. You may feel some pressure or cramping.  You may be asked to change positions at times during the procedure.  Your health care provider will check the monitor for any abnormal findings.  Your health care provider may also want  to take a small piece of tissue to check under a microscope (biopsy).  Your health care provider takes a final look at your sigmoid colon and rectum as the scope is slowly removed. AFTER THE PROCEDURE  You will stay in a recovery area for a short while until your health care provider says you can go home.   This information is not intended to replace advice given to you by your health care provider. Make sure you discuss any questions you have with your health care provider.   Document Released: 06/07/2000 Document Revised: 06/15/2013 Document Reviewed: 05/13/2013 Elsevier Interactive Patient Education 2016 [employer] PATIENT INSTRUCTIONS POST-ANESTHESIA  IMMEDIATELY FOLLOWING SURGERY:  Do not drive or operate machinery for the first twenty four hours after surgery.  Do not make any important decisions for twenty four hours after surgery or while taking narcotic pain medications or sedatives.  If you develop intractable nausea and vomiting or a severe headache please notify your doctor immediately.  FOLLOW-UP:  Please make an appointment with your surgeon as instructed. You do not need to follow up with anesthesia unless specifically instructed to do so.  WOUND CARE INSTRUCTIONS (if applicable):  Keep a dry clean dressing on the anesthesia/puncture wound site if there is drainage.  Once the wound has quit draining you may leave it open to air.  Generally you should leave the bandage intact for twenty four hours unless there is drainage.  If the epidural site drains for more than 36-48 hours please call the anesthesia department.  QUESTIONS?:  Please feel free to call your physician or the hospital operator if you have any questions, and they will be happy to assist you.

## 2015-06-20 ENCOUNTER — Encounter (HOSPITAL_COMMUNITY)
Admission: RE | Admit: 2015-06-20 | Discharge: 2015-06-20 | Disposition: A | Discharge status: Home / Self Care | Payer: Medicaid Other | Source: Ambulatory Visit | Attending: Gastroenterology | Admitting: Gastroenterology

## 2015-06-20 ENCOUNTER — Encounter (HOSPITAL_COMMUNITY): Payer: Self-pay

## 2015-06-20 DIAGNOSIS — Z01818 Encounter for other preprocedural examination: Secondary | ICD-10-CM | POA: Diagnosis present

## 2015-06-20 DIAGNOSIS — R1310 Dysphagia, unspecified: Secondary | ICD-10-CM | POA: Diagnosis not present

## 2015-06-20 LAB — BASIC METABOLIC PANEL
Anion gap: 9 (ref 5–15)
BUN: 16 mg/dL (ref 6–20)
CHLORIDE: 106 mmol/L (ref 101–111)
CO2: 21 mmol/L — AB (ref 22–32)
Calcium: 10.3 mg/dL (ref 8.9–10.3)
Creatinine, Ser: 0.99 mg/dL (ref 0.44–1.00)
GFR calc Af Amer: >60 mL/min (ref >60)
GFR calc non Af Amer: >60 mL/min (ref >60)
Glucose, Bld: 97 mg/dL (ref 65–99)
Potassium: 4.2 mmol/L (ref 3.5–5.1)
Sodium: 136 mmol/L (ref 135–145)

## 2015-06-20 LAB — CBC
HEMATOCRIT: 42.2 % (ref 36.0–46.0)
HEMOGLOBIN: 14.2 g/dL (ref 12.0–15.0)
MCH: 30.3 pg (ref 26.0–34.0)
MCHC: 33.6 g/dL (ref 30.0–36.0)
MCV: 90.2 fL (ref 78.0–100.0)
Platelets: 267 10*3/uL (ref 150–400)
RBC: 4.68 MIL/uL (ref 3.87–5.11)
RDW: 12.7 % (ref 11.5–15.5)
WBC: 6.7 10*3/uL (ref 4.0–10.5)

## 2015-06-22 ENCOUNTER — Encounter (HOSPITAL_COMMUNITY): Payer: Self-pay

## 2015-06-22 ENCOUNTER — Ambulatory Visit (HOSPITAL_COMMUNITY): Payer: Medicaid Other | Admitting: Anesthesiology

## 2015-06-22 ENCOUNTER — Ambulatory Visit (HOSPITAL_COMMUNITY)
Admission: RE | Admit: 2015-06-22 | Discharge: 2015-06-22 | Disposition: A | Discharge status: Home / Self Care | Payer: Medicaid Other | Source: Ambulatory Visit | Attending: Gastroenterology | Admitting: Gastroenterology

## 2015-06-22 ENCOUNTER — Encounter (HOSPITAL_COMMUNITY)
Admission: RE | Disposition: A | Discharge status: Home / Self Care | Payer: Self-pay | Source: Ambulatory Visit | Attending: Gastroenterology

## 2015-06-22 DIAGNOSIS — Z86010 Personal history of colonic polyps: Secondary | ICD-10-CM | POA: Diagnosis not present

## 2015-06-22 DIAGNOSIS — Z1211 Encounter for screening for malignant neoplasm of colon: Secondary | ICD-10-CM | POA: Diagnosis not present

## 2015-06-22 DIAGNOSIS — R1013 Epigastric pain: Secondary | ICD-10-CM | POA: Insufficient documentation

## 2015-06-22 DIAGNOSIS — K2970 Gastritis, unspecified, without bleeding: Secondary | ICD-10-CM | POA: Diagnosis not present

## 2015-06-22 DIAGNOSIS — F419 Anxiety disorder, unspecified: Secondary | ICD-10-CM | POA: Diagnosis not present

## 2015-06-22 DIAGNOSIS — K317 Polyp of stomach and duodenum: Secondary | ICD-10-CM | POA: Diagnosis not present

## 2015-06-22 DIAGNOSIS — K648 Other hemorrhoids: Secondary | ICD-10-CM | POA: Insufficient documentation

## 2015-06-22 DIAGNOSIS — Z7951 Long term (current) use of inhaled steroids: Secondary | ICD-10-CM | POA: Insufficient documentation

## 2015-06-22 DIAGNOSIS — Z9049 Acquired absence of other specified parts of digestive tract: Secondary | ICD-10-CM | POA: Diagnosis not present

## 2015-06-22 DIAGNOSIS — R1310 Dysphagia, unspecified: Secondary | ICD-10-CM | POA: Diagnosis not present

## 2015-06-22 HISTORY — PX: ESOPHAGOGASTRODUODENOSCOPY (EGD) WITH PROPOFOL: SHX5813

## 2015-06-22 HISTORY — PX: FLEXIBLE SIGMOIDOSCOPY: SHX5431

## 2015-06-22 HISTORY — PX: BIOPSY: SHX5522

## 2015-06-22 SURGERY — SIGMOIDOSCOPY, FLEXIBLE
Anesthesia: Monitor Anesthesia Care

## 2015-06-22 MED ORDER — GLYCOPYRROLATE 0.2 MG/ML IJ SOLN
0.2000 mg | Freq: Once | INTRAMUSCULAR | Status: AC
  Administered 2015-06-22: 0.2 mg via INTRAVENOUS

## 2015-06-22 MED ORDER — ONDANSETRON HCL 4 MG/2ML IJ SOLN
4.0000 mg | Freq: Once | INTRAMUSCULAR | Status: AC
  Administered 2015-06-22: 4 mg via INTRAVENOUS

## 2015-06-22 MED ORDER — FENTANYL CITRATE (PF) 100 MCG/2ML IJ SOLN
25.0000 ug | INTRAMUSCULAR | Status: AC
  Administered 2015-06-22: 25 ug via INTRAVENOUS

## 2015-06-22 MED ORDER — MIDAZOLAM HCL 2 MG/2ML IJ SOLN
INTRAMUSCULAR | Status: AC
  Filled 2015-06-22: qty 2

## 2015-06-22 MED ORDER — DEXAMETHASONE SODIUM PHOSPHATE 4 MG/ML IJ SOLN
INTRAMUSCULAR | Status: AC
  Filled 2015-06-22: qty 1

## 2015-06-22 MED ORDER — LACTATED RINGERS IV SOLN
INTRAVENOUS | Status: DC
  Administered 2015-06-22: 08:00:00 via INTRAVENOUS

## 2015-06-22 MED ORDER — GLYCOPYRROLATE 0.2 MG/ML IJ SOLN
INTRAMUSCULAR | Status: AC
  Filled 2015-06-22: qty 1

## 2015-06-22 MED ORDER — DEXAMETHASONE SODIUM PHOSPHATE 4 MG/ML IJ SOLN
4.0000 mg | Freq: Once | INTRAMUSCULAR | Status: AC
  Administered 2015-06-22: 4 mg via INTRAVENOUS

## 2015-06-22 MED ORDER — LIDOCAINE VISCOUS 2 % MT SOLN
OROMUCOSAL | Status: AC
  Filled 2015-06-22: qty 15

## 2015-06-22 MED ORDER — PROPOFOL 500 MG/50ML IV EMUL
INTRAVENOUS | Status: DC | PRN
  Administered 2015-06-22: 150 ug/kg/min via INTRAVENOUS
  Administered 2015-06-22: 09:00:00 via INTRAVENOUS

## 2015-06-22 MED ORDER — FENTANYL CITRATE (PF) 100 MCG/2ML IJ SOLN
INTRAMUSCULAR | Status: AC
  Filled 2015-06-22: qty 2

## 2015-06-22 MED ORDER — LACTATED RINGERS IV BOLUS (SEPSIS)
400.0000 mL | Freq: Once | INTRAVENOUS | Status: AC
  Administered 2015-06-22: 400 mL via INTRAVENOUS

## 2015-06-22 MED ORDER — MIDAZOLAM HCL 2 MG/2ML IJ SOLN
1.0000 mg | INTRAMUSCULAR | Status: DC | PRN
  Administered 2015-06-22: 2 mg via INTRAVENOUS

## 2015-06-22 MED ORDER — FENTANYL CITRATE (PF) 100 MCG/2ML IJ SOLN: 25.0000 ug | INTRAMUSCULAR | Status: DC | PRN

## 2015-06-22 MED ORDER — FENTANYL CITRATE (PF) 100 MCG/2ML IJ SOLN
INTRAMUSCULAR | Status: DC | PRN
  Administered 2015-06-22 (×4): 25 ug via INTRAVENOUS

## 2015-06-22 MED ORDER — STERILE WATER FOR IRRIGATION IR SOLN
Status: DC | PRN
  Administered 2015-06-22: 08:00:00

## 2015-06-22 MED ORDER — ONDANSETRON HCL 4 MG/2ML IJ SOLN
INTRAMUSCULAR | Status: AC
  Filled 2015-06-22: qty 2

## 2015-06-22 MED ORDER — MIDAZOLAM HCL 5 MG/5ML IJ SOLN
INTRAMUSCULAR | Status: DC | PRN
  Administered 2015-06-22: 2 mg via INTRAVENOUS

## 2015-06-22 MED ORDER — LIDOCAINE HCL (CARDIAC) 10 MG/ML IV SOLN
INTRAVENOUS | Status: DC | PRN
  Administered 2015-06-22: 50 mg via INTRAVENOUS

## 2015-06-22 MED ORDER — LIDOCAINE HCL 2 % EX GEL
1.0000 "application " | Freq: Once | CUTANEOUS | Status: AC
  Administered 2015-06-22: 1 via TOPICAL

## 2015-06-22 MED ORDER — PROPOFOL 10 MG/ML IV BOLUS
INTRAVENOUS | Status: AC
  Filled 2015-06-22: qty 20

## 2015-06-22 MED ORDER — ONDANSETRON HCL 4 MG/2ML IJ SOLN: 4.0000 mg | Freq: Once | INTRAMUSCULAR | Status: DC | PRN

## 2015-06-22 NOTE — Anesthesia Procedure Notes (Signed)
Procedure Name: MAC Date/Time: 06/22/2015 8:17 AM Performed by: Andree Elk, AMY A Pre-anesthesia Checklist: Patient identified, Suction available, Emergency Drugs available, Patient being monitored and Timeout performed Patient Re-evaluated:Patient Re-evaluated prior to inductionOxygen Delivery Method: Simple face mask

## 2015-06-22 NOTE — Discharge Instructions (Signed)
I dilated your esophagus. I DID NOT SEE A DEFINITE NARROWING IN YOUR esophagus. You have gastritis. I biopsied your ESOPHAGUS AND stomach.   DRINK WATER TO KEEP YOUR URINE LIGHT YELLOW.  FOLLOW A HIGH FIBER/LOW FAT DIET. AVOID ITEMS THAT CAUSE BLOATING & GAS. SEE INFO BELOW.  CONTINUE OMEPRAZOLE.  YOUR BIOPSY RESULTS WILL BE AVAILABLE IN MY CHART TUES JAN 3 AND MY OFFICE WILL CONTACT YOU IN 10-14 DAYS WITH YOUR RESULTS.   PLEASE CALL IN 3 MOS IF YOU ARE STILL HAVING PROBLEMS SWALLOWING.  FOLLOW UP IN JUL 2017.   UPPER ENDOSCOPY AFTER CARE Read the instructions outlined below and refer to this sheet in the next week. These discharge instructions provide you with general information on caring for yourself after you leave the hospital. While your treatment has been planned according to the most current medical practices available, unavoidable complications occasionally occur. If you have any problems or questions after discharge, call DR. Hamdan Toscano, [phone].  ACTIVITY  You may resume your regular activity, but move at a slower pace for the next 24 hours.   Take frequent rest periods for the next 24 hours.   Walking will help get rid of the air and reduce the bloated feeling in your belly (abdomen).   No driving for 24 hours (because of the medicine (anesthesia) used during the test).   You may shower.   Do not sign any important legal documents or operate any machinery for 24 hours (because of the anesthesia used during the test).    NUTRITION  Drink plenty of fluids.   You may resume your normal diet as instructed by your doctor.   Begin with a light meal and progress to your normal diet. Heavy or fried foods are harder to digest and may make you feel sick to your stomach (nauseated).   Avoid alcoholic beverages for 24 hours or as instructed.    MEDICATIONS  You may resume your normal medications.   WHAT YOU CAN EXPECT TODAY  Some feelings of bloating in the  abdomen.   Passage of more gas than usual.    IF YOU HAD A BIOPSY TAKEN DURING THE UPPER ENDOSCOPY:  Eat a soft diet IF YOU HAVE NAUSEA, BLOATING, ABDOMINAL PAIN, OR VOMITING.    FINDING OUT THE RESULTS OF YOUR TEST Not all test results are available during your visit. DR. Oneida Alar WILL CALL YOU WITHIN 14 DAYS OF YOUR PROCEDUE WITH YOUR RESULTS. Do not assume everything is normal if you have not heard from DR. Valina Maes, CALL HER OFFICE AT [phone].  SEEK IMMEDIATE MEDICAL ATTENTION AND CALL THE OFFICE: [phone] IF:  You have more than a spotting of blood in your stool.   Your belly is swollen (abdominal distention).   You are nauseated or vomiting.   You have a temperature over 101F.   You have abdominal pain or discomfort that is severe or gets worse throughout the day.  Gastritis  Gastritis is an inflammation (the body's way of reacting to injury and/or infection) of the stomach. It is often caused by viral or bacterial (germ) infections. It can also be caused BY ASPIRIN, BC/GOODY POWDER'S, (IBUPROFEN) MOTRIN, OR ALEVE (NAPROXEN), chemicals (including alcohol), SPICY FOODS, and medications. This illness may be associated with generalized malaise (feeling tired, not well), UPPER ABDOMINAL STOMACH cramps, and fever. One common bacterial cause of gastritis is an organism known as H. Pylori. This can be treated with antibiotics.   DYSPHAGIA DYSPHAGIA can be caused by stomach acid backing  up into the tube that carries food from the mouth down to the stomach (lower esophagus).  TREATMENT There are a number of medicines used to treat DYSPHAGIA including: Antacids.  Proton-pump inhibitors: OMEPRAZOLE  HOME CARE INSTRUCTIONS Eat 2-3 hours before going to bed.  Try to reach and maintain a healthy weight.  Do not eat just a few very large meals. Instead, eat 4 TO 6 smaller meals throughout the day.  Try to identify foods and beverages that make your symptoms worse, and avoid  these.  Avoid tight clothing.  Do not exercise right after eating.

## 2015-06-22 NOTE — Transfer of Care (Signed)
Immediate Anesthesia Transfer of Care Note  Patient: Alicia Tyler  Procedure(s) Performed: Procedure(s) with comments: FLEXIBLE SIGMOIDOSCOPY WITH PROPOFOL (N/A) - 0830  ESOPHAGOGASTRODUODENOSCOPY (EGD) WITH PROPOFOL (N/A) BIOPSY - gastric and esophageal bx's  Patient Location: PACU  Anesthesia Type:MAC  Level of Consciousness: awake, alert , oriented and patient cooperative  Airway & Oxygen Therapy: Patient Spontanous Breathing and Patient connected to nasal cannula oxygen  Post-op Assessment: Report given to RN and Post -op Vital signs reviewed and stable  Post vital signs: Reviewed and stable  Last Vitals:  Filed Vitals:   06/22/15 0810 06/22/15 0815  BP: 115/79 112/82  Pulse:    Temp:    Resp: 16 17    Complications: No apparent anesthesia complications

## 2015-06-22 NOTE — Op Note (Signed)
[facility] [address] [place], [address]   ENDOSCOPY PROCEDURE REPORT  PATIENT: Alicia Tyler, Alicia Tyler  MR#: [id number] BIRTHDATE: 02/14/1971 , 66  yrs. old GENDER: female  ENDOSCOPIST: Danie Binder, MD REFFERED [id number] Manuella Ghazi, M.D. PROCEDURE DATE:  06/26/2015 PROCEDURE:   EGD with biopsy and EGD with dilatation over guidewire   INDICATIONS:1.  dysphagia.   2.  dyspepsia. MEDICATIONS: Monitored anesthesia care TOPICAL ANESTHETIC: Viscous Xylocaine  DESCRIPTION OF PROCEDURE:   After the risks benefits and alternatives of the procedure were thoroughly explained, informed consent was obtained.  The [id number]  endoscope was introduced through the mouth and advanced to the second portion of the duodenum. The instrument was slowly withdrawn as the mucosa was carefully examined.  Prior to withdrawal of the scope, the guidwire was placed.  The esophagus was dilated successfully.  The patient was recovered in endoscopy and discharged home in satisfactory condition. Estimated blood loss is zero unless otherwise noted in this procedure report.   ESOPHAGUS: The mucosa of the esophagus appeared normal.  Multiple biopsies were taken in the distal (35 CM FROM THE TEETH)and PROXIMAL esophagus 20 CM FROM THE TEETH) to rule out eosinophilic esophagitis.  GE JXN 40 CM FROM THE TEETH.  STOMACH: A small sessile polyp was found in the gastric fundus.  A biopsy was performed using cold forceps.   Mild non-erosive gastritis (inflammation) was found in the gastric antrum, gastric body, and gastric fundus.  Multiple biopsies were performed using cold forceps.   DUODENUM: The duodenal mucosa showed no abnormalities in the bulb and second portion of the duodenum.   Dilation was then performed at the proximal esophagus DUE TO POSSIBLE ESOPHAGEAL WEB. Dilator: Savary over guidewire Size(s): 14-17 mm Resistance: minimal Heme: yes trace    COMPLICATIONS: There were no  immediate complications.  ENDOSCOPIC IMPRESSION: 1.   MILD GASTRITIS 2.   Small GASTRIC polyp in the gastric fundus  RECOMMENDATIONS: DRINK WATER TO KEEP URINE LIGHT YELLOW. FOLLOW A HIGH FIBER/LOW FAT DIET. CONTINUE OMEPRAZOLE. AWAIT BIOPSY RESULTS. CALL IN 3 MOS IF STILL HAVING DYSPHAGIA, NEEDS BPE. FOLLOW UP IN JUL 2017. NEXT FLEX SIG IN 5 YEARS.   eSigned:  Danie Binder, MD 06-26-2015 1:19 PM  CPT CODES: ICD CODES:  The ICD and CPT codes recommended by this software are interpretations from the data that the clinical staff has captured with the software.  The verification of the translation of this report to the ICD and CPT codes and modifiers is the sole responsibility of the health care institution and practicing physician where this report was generated.  [employer]. will not be held responsible for the validity of the ICD and CPT codes included on this report.  AMA assumes no liability for data contained or not contained herein. CPT is a Designer, television/film set of the [employer].

## 2015-06-22 NOTE — Op Note (Signed)
[facility] [address] [place], [address]   FLEX SIGMOIDOSCOPY PROCEDURE REPORT  PATIENT: Alicia Tyler, Alicia Tyler  MR#: [id number] BIRTHDATE: 1970/07/30 , 44  yrs. old GENDER: female ENDOSCOPIST: Danie Binder, MD REFERRED [id number] Manuella Ghazi, M.D. 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 MEDICATIONS: Monitored anesthesia care  DESCRIPTION OF PROCEDURE:    Physical exam was performed.  Informed consent was obtained from the patient after explaining the benefits, risks, and alternatives to procedure.  The patient was connected to monitor and placed in left lateral position. Continuous oxygen was provided by nasal cannula and IV medicine administered through an indwelling cannula.  After administration of sedation and rectal exam, the patients rectum was intubated and the [id number]  colonoscope was advanced under direct visualization to the [place]. The scope was removed slowly by carefully examining the color, texture, anatomy, and integrity mucosa on the way out.  The patient was recovered in endoscopy and discharged home in satisfactory condition. Estimated blood loss is zero unless otherwise noted in this procedure report.    COLON FINDINGS: The colon mucosa was otherwise normal.  , There was evidence of a prior surgical anastomosis located 20 cm from the point of entry.  , and Small internal hemorrhoids were found.  PREP QUALITY: The overall prep quality was excellent.  COMPLICATIONS: None  ENDOSCOPIC IMPRESSION: 1.   Normal RECTUM 2.   NORMAL anastomosis located 20 cm from the point of entry 3.   Small internal hemorrhoids  RECOMMENDATIONS: DRINK WATER TO KEEP URINE LIGHT YELLOW. FOLLOW A HIGH FIBER/LOW FAT DIET. CONTINUE OMEPRAZOLE. AWAIT BIOPSY RESULTS. CALL IN 3 MOS IF STILL HAVING DYSPHAGIA, NEEDS BPE. FOLLOW UP IN JUL  2017. NEXT FLEX SIG IN 5 YEARS.  ALL FIRST DEGREE RELATIVES NEED TCS AT AGE 66.   _______________________________ eSignedDanie Binder, MD 08-Jul-2015 1:26 PM   CPT CODES: ICD CODES:  The ICD and CPT codes recommended by this software are interpretations from the data that the clinical staff has captured with the software.  The verification of the translation of this report to the ICD and CPT codes and modifiers is the sole responsibility of the health care institution and practicing physician where this report was generated.  [employer]. will not be held responsible for the validity of the ICD and CPT codes included on this report.  AMA assumes no liability for data contained or not contained herein. CPT is a Designer, television/film set of the [employer].

## 2015-06-22 NOTE — Anesthesia Preprocedure Evaluation (Signed)
Anesthesia Evaluation  Patient identified by MRN, date of birth, ID band Patient awake    Reviewed: Allergy & Precautions, H&P , NPO status , Patient's Chart, lab work & pertinent test results  History of Anesthesia Complications (+) PONV and history of anesthetic complications  Airway Mallampati: II  TM Distance: >3 FB Neck ROM: Full    Dental  (+) Dental Advisory Given, Teeth Intact   Pulmonary Current Smoker,    breath sounds clear to auscultation       Cardiovascular negative cardio ROS   Rhythm:Regular Rate:Normal     Neuro/Psych  Headaches, PSYCHIATRIC DISORDERS Anxiety    GI/Hepatic GERD  Medicated and Poorly Controlled,IBS, gastroparesis   Endo/Other    Renal/GU      Musculoskeletal  (+) Arthritis  (chronic LBP, lumbar fusion),   Abdominal   Peds  Hematology   Anesthesia Other Findings Chronic narcotics for back pain  Reproductive/Obstetrics                             Anesthesia Physical Anesthesia Plan  ASA: III  Anesthesia Plan: MAC   Post-op Pain Management:    Induction: Intravenous  Airway Management Planned: Simple Face Mask  Additional Equipment:   Intra-op Plan:   Post-operative Plan:   Informed Consent: I have reviewed the patients History and Physical, chart, labs and discussed the procedure including the risks, benefits and alternatives for the proposed anesthesia with the patient or authorized representative who has indicated his/her understanding and acceptance.     Plan Discussed with:   Anesthesia Plan Comments:         Anesthesia Quick Evaluation

## 2015-06-22 NOTE — H&P (View-Only) (Signed)
Subjective:    Patient ID: Alicia Tyler, female    DOB: [birth year], 44 y.o.   MRN: [id number]  [employer], MD  HPI HERE TO DISCUSS FLEX SIG. GOT HOT FLASHES. RARE NAUSEA/VOMTIING/ BMs: VARIES BETWEEN CONSTIPATION AND DIARRHEA. RARE ABD PAIN WHEN SHE FEELS CONSTIPATED OR HAS HARD STOOL. HEARTBURN: RARE 1-2X/MO. HAD TO HAVE THE [place] AFTER CHICKEN x3. HARD TO SEDATE. SCREWS IN RIGHT BACK ARE BROKEN. LOVES TO GO TO CONCERTS. LAST ONE WAS BEST ONE: GARTH BROOKS/TRISHA YEARWOOD. HAS CHAIN ON RIGHT CHAIN DUE TO ATTENDING AN AC/DC CONCERT.  PT DENIES FEVER, CHILLS, HEMATOCHEZIA, HEMATEMESIS, melena, CHEST PAIN, SHORTNESS OF BREATH,  CHANGE IN BOWEL IN HABITS, problems with sedation, heartburn or indigestion.   Past Medical History  Diagnosis Date  . Seasonal allergies   . BMI (body mass index) 20.0-29.01 Apr 2010 172 LBS  . 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 Headache(784.0)   . Anxiety   . PONV (postoperative nausea and vomiting)   . Chronic back pain   . Neuropathy   . Degenerative disc disease, thoracic   . 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 Tubal ligation    . Ovarian cyst removal    . Breast lumpectomy      from the left breast  . Colonoscopy  NOV 2011 SCREENING     HYPERPLASTIC RECTAL POLYP, SML IH, incomplete due to discomfort  . Colonoscopy  DEC 2011 w/ PROPOFOL    SERRATED ADENOMA, HYPERPLASTIC POLY[S[  . Knee surgery Bilateral     2 on righ-1 scope and one open; and one on left  . Ablation      endometrial ablation  . Mouth surgery  07/21/2013  . Smart pill procedure N/A 07/04/2014    Procedure: SMART PILL PROCEDURE;  Surgeon: Danie Binder, MD;  [facility];  Service: Endoscopy;  Laterality: N/A;  800  . Back surgery  05/14/2011    Lumbar Fusion and cage  . Colectomy N/A 08/24/2014    Procedure: TOTAL COLECTOMY;  Surgeon: Jamesetta So, MD;  Location: [facility];  Service: General;  Laterality: N/A;     Allergies  Allergen Reactions  . Bee Venom Anaphylaxis       . Beesix [Pyridoxine Hcl] Anaphylaxis  . Penicillins Other (See Comments)    Makes hair fall out  . Aspirin Swelling and Rash  . Codeine Nausea And Vomiting and Rash    Current Outpatient Prescriptions  Medication Sig Dispense Refill  . ALPRAZolam (XANAX) 0.5 MG tablet Take 1 tablet (0.5 mg total) by mouth daily. 50 tablet 0  . Biotin 5000 MCG CAPS Take 5,000 mcg by mouth 2 (two) times daily.      . Cholecalciferol (VITAMIN D) 2000 UNITS tablet Take 2,000 Units by mouth daily.      . cycloSPORINE (RESTASIS) 0.05 % ophthalmic emulsion Place 1 drop into both eyes 2 (two) times daily.    . diazepam (VALIUM) 10 MG tablet Take 1 tablet (10 mg total) by mouth every 8 (eight) hours as needed (muscle spasms).    . FIBER SELECT GUMMIES PO Take 2 tablets by mouth 2 (two) times daily.     . fluticasone (FLONASE) 50 MCG/ACT nasal spray Place 1 spray into both nostrils 2 (two) times daily.    . furosemide (LASIX) 20 MG tablet Take 20 mg by mouth.    . gabapentin (NEURONTIN) 300 MG capsule Take 300 mg by mouth 3 (  three) times daily.    . Linaclotide (LINZESS) 290 MCG CAPS capsule Take 290 mcg by mouth daily.    Marland Kitchen loratadine (CLARITIN) 10 MG tablet Take 10 mg by mouth daily.      Marland Kitchen loteprednol (ALREX) 0.2 % SUSP Place 1 drop into both eyes 2 (two) times daily.    . meloxicam (MOBIC) 15 MG tablet Take 15 mg by mouth daily.    . NON FORMULARY Compound nasal spray    . Olopatadine HCl 0.2 % SOLN Place 1 drop into both eyes 2 (two) times daily.    Marland Kitchen omeprazole (PRILOSEC) 20 MG capsule Take 1 capsule (20 mg total) by mouth daily.    Marland Kitchen oxyCODONE (ROXICODONE) 15 MG immediate release tablet Take 15 mg by mouth every 4 (four) hours as needed for pain.     . SUMAtriptan (IMITREX) 100 MG tablet Take 100 mg by mouth every 2 (two) hours as needed for migraine or headache. May repeat in 2 hours if headache persists or recurs.    . topiramate  (TOPAMAX) 50 MG tablet Take 50 mg by mouth 2 (two) times daily.     . OxyCODONE (OXYCONTIN) 15 mg T12A 12 hr tablet Take 1 tablet (15 mg total) by mouth every 12 (twelve) hours. (Patient not taking: Reported on 02/16/2015)      Review of Systems PER HPI OTHERWISE ALL SYSTEMS ARE NEGATIVE.    Objective:   Physical Exam  Constitutional: She is oriented to person, place, and time. She appears well-developed and well-nourished. No distress.  HENT:  Head: Normocephalic and atraumatic.  Mouth/Throat: Oropharynx is clear and moist. No oropharyngeal exudate.  Eyes: Pupils are equal, round, and reactive to light. No scleral icterus.  Neck: Normal range of motion. Neck supple.  Cardiovascular: Normal rate, regular rhythm and normal heart sounds.   Pulmonary/Chest: Effort normal and breath sounds normal. No respiratory distress.  Abdominal: Soft. Bowel sounds are normal. She exhibits no distension. There is no tenderness.  Musculoskeletal: She exhibits no edema.  Lymphadenopathy:    She has no cervical adenopathy.  Neurological: She is alert and oriented to person, place, and time.  NO FOCAL DEFICITS  Psychiatric: She has a normal mood and affect.  Vitals reviewed.     Assessment & Plan:

## 2015-06-22 NOTE — Interval H&P Note (Signed)
History and Physical Interval Note:  06/22/2015 8:03 AM  Alicia Tyler  has presented today for surgery, with the diagnosis of DYSPHAGIA/SCREENING  The various methods of treatment have been discussed with the patient and family. After consideration of risks, benefits and other options for treatment, the patient has consented to  Procedure(s) with comments: FLEXIBLE SIGMOIDOSCOPY WITH PROPOFOL (N/A) - 0830  ESOPHAGOGASTRODUODENOSCOPY (EGD) WITH PROPOFOL (N/A) as a surgical intervention .  The patient's history has been reviewed, patient examined, no change in status, stable for surgery.  I have reviewed the patient's chart and labs.  Questions were answered to the patient's satisfaction.     [employer]

## 2015-06-22 NOTE — Anesthesia Postprocedure Evaluation (Signed)
Anesthesia Post Note  Patient: [name]  Procedure(s) Performed: Procedure(s) (LRB): FLEXIBLE SIGMOIDOSCOPY WITH PROPOFOL (N/A) ESOPHAGOGASTRODUODENOSCOPY (EGD) WITH PROPOFOL (N/A) BIOPSY  Patient location during evaluation: PACU Anesthesia Type: MAC Level of consciousness: awake and alert and oriented Pain management: pain level controlled Vital Signs Assessment: post-procedure vital signs reviewed and stable Respiratory status: spontaneous breathing and respiratory function stable Cardiovascular status: stable Postop Assessment: no signs of nausea or vomiting Anesthetic complications: no    Last Vitals:  Filed Vitals:   06/22/15 0810 06/22/15 0815  BP: 115/79 112/82  Pulse:    Temp:    Resp: 16 17    Last Pain:  Filed Vitals:   06/22/15 0815  PainSc: 7                  Lindsi Bayliss A

## 2015-06-27 ENCOUNTER — Encounter (HOSPITAL_COMMUNITY): Payer: Self-pay | Admitting: Gastroenterology

## 2015-06-29 ENCOUNTER — Telehealth: Payer: Self-pay | Admitting: Gastroenterology

## 2015-06-29 NOTE — Telephone Encounter (Signed)
REMINDER IN EPIC °

## 2015-06-29 NOTE — Telephone Encounter (Signed)
Please call pt. HER ESOPHAGUS BIOPSIES SHOW REFLUX. HER stomach Bx shows mild gastritis. HER PROBLEMS SWALLOWING ARE MOTS LIKELY DUE TO UNCONTROLLED REFLUX.    DRINK WATER TO KEEP YOUR URINE LIGHT YELLOW.  FOLLOW A HIGH FIBER/LOW FAT DIET. AVOID ITEMS THAT CAUSE BLOATING & GAS.   AVOID REFLUX TRIGGERS.   CONTINUE OMEPRAZOLE 30 MINS BEFORE YOUR FIRST MEAL.  PLEASE CALL IN 3 MOS IF YOU ARE STILL HAVING PROBLEMS SWALLOWING.  FOLLOW UP IN JUL 2017 GERD/DYSPHAGIA.

## 2015-06-29 NOTE — Telephone Encounter (Signed)
PT is aware of results.  

## 2015-08-08 ENCOUNTER — Other Ambulatory Visit: Payer: Self-pay | Admitting: Gastroenterology

## 2015-09-11 ENCOUNTER — Other Ambulatory Visit: Payer: Self-pay | Admitting: Gastroenterology

## 2015-11-06 ENCOUNTER — Encounter: Payer: Self-pay | Admitting: Gastroenterology

## 2015-12-13 ENCOUNTER — Encounter: Payer: Self-pay | Admitting: Gastroenterology

## 2015-12-13 ENCOUNTER — Ambulatory Visit (INDEPENDENT_AMBULATORY_CARE_PROVIDER_SITE_OTHER): Payer: Medicaid Other | Admitting: Gastroenterology

## 2015-12-13 VITALS — BP 104/76 | HR 82 | Temp 96.3°F | Ht 63.0 in | Wt 139.0 lb

## 2015-12-13 DIAGNOSIS — R1310 Dysphagia, unspecified: Secondary | ICD-10-CM | POA: Diagnosis not present

## 2015-12-13 DIAGNOSIS — K219 Gastro-esophageal reflux disease without esophagitis: Secondary | ICD-10-CM | POA: Diagnosis not present

## 2015-12-13 DIAGNOSIS — K582 Mixed irritable bowel syndrome: Secondary | ICD-10-CM | POA: Diagnosis not present

## 2015-12-13 DIAGNOSIS — K3184 Gastroparesis: Secondary | ICD-10-CM | POA: Diagnosis not present

## 2015-12-13 NOTE — Progress Notes (Signed)
cc'ed to pcp °

## 2015-12-13 NOTE — Assessment & Plan Note (Signed)
SYMPTOMS FAIRLY WELL CONTROLLED.  Use Prilosec 30 minutes prior to your first meal. AVOID TRIGGERS. FOLLOW UP IN 6 MOS.

## 2015-12-13 NOTE — Progress Notes (Signed)
ON RECALL  °

## 2015-12-13 NOTE — Assessment & Plan Note (Signed)
SYMPTOMS FAIRLY WELL CONTROLLED.  CONTINUE TO MONITOR SYMPTOMS. IF PERSIST, PT NEEDS BPE. FOLLOW UP IN 6 MOS.

## 2015-12-13 NOTE — Progress Notes (Signed)
Subjective:    Patient ID: Alicia Tyler, female    DOB: February 02, 1971, 45 y.o.   MRN: [id number]  Monico Blitz, MD  HPI FEELS LIKE SHE OVER STRAINS.GOT HIT PRETTY BAD AFTER TORNADO MAY 5. LOST RIGHT MUCH STUFF. UNDER SWEATING STRAINING AND STRESS. WEIGHT STABLE: 143 LBS DEC 2016. 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 FEEL SLIKE SHE FEELS BLOATED AND KNOTS. NOT EATING A WHOLE LOW OF VEGETABLES. EATS WHITE RICE OR A WHOLE LOT. LAST SEEN DEC 2016 FOR EGD/DIL FLEX SIG. MAY TOLERATE SOME FOODS BETTER THAN OTHERS. RARE CONSTIPATION WITH CONSTIPATION. TAKING LINZESS TO HELP GET THROUGH THAT AND IT HELPS. GETS BURPING. RARE NAUSEA, HEARTBURN,  ABDOMINAL PAIN IF FEELS LIKE SHE NEEDS TO GET OUT STOOL AND AFTER A BM IT'S OK. CAN GET THICK THROATED AT TIMES AND SWALLOWING  LITTLE BETTER SINCE EGD/DIL. DRINKING MORE WATER. BOWELS: COMBO-SOFT, LOOSE, WATER, RARE FORMED STOOL. FEELS LIKE SHE HAS GAS AND HAPPENS IN LEFT CHEST AND THEN TO LEFT SHOULDER. CAN BE SHARP OFF AND ON FOR PAST MAY 2017. PT DENIES FEVER, CHILLS, HEMATOCHEZIA, HEMATEMESIS, nausea, vomiting, melena, diarrhea, SHORTNESS OF BREATH,  CHANGE IN BOWEL IN HABITS, OR problems with sedation.  Past Medical History  Diagnosis Date  . Seasonal allergies   . BMI (body mass index) 20.0-29.01 Apr 2010 172 LBS  . 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 Headache(784.0)   . Anxiety   . PONV (postoperative nausea and vomiting)   . Chronic back pain   . Neuropathy ([place])   . Degenerative disc disease, thoracic   . 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 Tubal ligation    . Ovarian cyst removal    . Breast lumpectomy      from the left breast  . Colonoscopy  NOV 2011 SCREENING     HYPERPLASTIC RECTAL POLYP, SML IH, incomplete due to discomfort  . Colonoscopy  DEC 2011 w/ PROPOFOL    SERRATED ADENOMA, HYPERPLASTIC POLY[S[  . Knee surgery Bilateral     2 on righ-1  scope and one open; and one on left  . Ablation      endometrial ablation  . Mouth surgery  07/21/2013  . Smart pill procedure N/A 07/04/2014    Procedure: SMART PILL PROCEDURE;  Surgeon: Danie Binder, MD;  [facility];  Service: Endoscopy;  Laterality: N/A;  800  . Colectomy N/A 08/24/2014    Procedure: TOTAL COLECTOMY;  Surgeon: Jamesetta So, MD;  Location: [facility];  Service: General;  Laterality: N/A;  . Appendectomy    . Back surgery  05/14/2011    Lumbar Fusion and cage  . Flexible sigmoidoscopy N/A 06/22/2015    Procedure: FLEXIBLE SIGMOIDOSCOPY WITH PROPOFOL;  Surgeon: Danie Binder, MD;  [facility];  Service: Endoscopy;  Laterality: N/A;  0830   . Esophagogastroduodenoscopy (egd) with propofol N/A 06/22/2015    Procedure: ESOPHAGOGASTRODUODENOSCOPY (EGD) WITH PROPOFOL;  Surgeon: Danie Binder, MD;  [facility];  Service: Endoscopy;  Laterality: N/A;  . Biopsy  06/22/2015    Procedure: BIOPSY;  Surgeon: Danie Binder, MD;  [facility];  Service: Endoscopy;;  gastric and esophageal bx's    Allergies  Allergen Reactions  . Bee Venom Anaphylaxis       . Beesix [Pyridoxine Hcl] Anaphylaxis  . Penicillins Other (See Comments)    Makes hair fall out  .  Aspirin Swelling and Rash  . Codeine Nausea And Vomiting and Rash    Current Outpatient Prescriptions  Medication Sig Dispense Refill  .      Marland Kitchen Biotin 5000 MCG CAPS Take 5,000 mcg by mouth 2 (two) times daily.      . Cholecalciferol (VITAMIN D) 2000 UNITS tablet Take 2,000 Units by mouth daily.      . cycloSPORINE (RESTASIS) 0.05 % ophthalmic emulsion Place 1 drop into both eyes 2 (two) times daily.    . diazepam (VALIUM) 10 MG tablet Take 1 tablet (10 mg total)Q8H as needed (muscle spasms). 3-4X/DAY   . FIBER POWDER 1 TSPBID    . fluticasone (FLONASE) 50 MCG/ACT nasal spray Place 1 spray into both nostrils 2 (two) times daily.    . furosemide (LASIX) 20 MG tablet Take 20 mg by  mouth.    . gabapentin (NEURONTIN) 300 MG capsule Take 300 mg by mouth 3 (three) times daily.    . Linaclotide (LINZESS) 290 MCG CAPS capsule Take 290 mcg by mouth daily.    Marland Kitchen LINZESS 290 MCG CAPS capsule TAKE 1  30 MINUTES PRIOR TO BREAKFAST    . loratadine (CLARITIN) 10 MG tablet Take 10 mg by mouth daily.      Marland Kitchen loteprednol (ALREX) 0.2 % SUSP Place 1 drop into both eyes 2 (two) times daily.    . meloxicam (MOBIC) 15 MG tablet Take 15 mg by mouth daily.    . NON FORMULARY Compound nasal spray    . Olopatadine HCl 0.2 % SOLN Place 1 drop into both eyes 2 (two) times daily.    Marland Kitchen omeprazole (PRILOSEC) 20 MG capsule Take 1 capsule (20 mg total) by mouth daily.    . Oxycodone HCl 10 MG TABS Take 10 mg by mouth every 4 (four) hours as needed (pain). 3X/DAY   . SUMAtriptan (IMITREX) 100 MG tablet Take 100 mg Q2H PRN as needed for migraine or headache.     . topiramate (TOPAMAX) 50 MG tablet Take 50 mg by mouth 2 (two) times daily.      Review of Systems PER HPI OTHERWISE ALL SYSTEMS ARE NEGATIVE.    Objective:   Physical Exam  Constitutional: She is oriented to person, place, and time. She appears well-developed and well-nourished. No distress.  HENT:  Head: Normocephalic and atraumatic.  Mouth/Throat: Oropharynx is clear and moist. No oropharyngeal exudate.  Eyes: Pupils are equal, round, and reactive to light. No scleral icterus.  Neck: Normal range of motion. Neck supple.  Cardiovascular: Normal rate, regular rhythm and normal heart sounds.   Pulmonary/Chest: Effort normal and breath sounds normal. No respiratory distress.  Abdominal: Soft. Bowel sounds are normal. She exhibits no distension. There is tenderness.  MILD TTP x4  Musculoskeletal: She exhibits no edema.  Lymphadenopathy:    She has no cervical adenopathy.  Neurological: She is alert and oriented to person, place, and time.  NO FOCAL DEFICITS  Psychiatric: She has a normal mood and affect.  Vitals reviewed.           Assessment & Plan:

## 2015-12-13 NOTE — Patient Instructions (Signed)
DRINK WATER TO KEEP YOUR URINE LIGHT YELLOW.  FOLLOW A HIGH FIBER DIET.   Use Prilosec 30 minutes prior to your first meal.  PLEASE CALL WITH QUESTIONS OR CONCERNS.  FOLLOW UP IN 6 MOS.

## 2015-12-13 NOTE — Assessment & Plan Note (Signed)
SYMPTOMS CONTROLLED/RESOLVED.  CONTINUE TO MONITOR SYMPTOMS. 

## 2015-12-13 NOTE — Assessment & Plan Note (Signed)
SYMPTOMS FAIRLY WELL CONTROLLED.  [place] WATER EAT FIBER CONTINUE LINZESS. FOLLOW UP IN 6 MOS.

## 2016-01-27 IMAGING — CT CT ABD-PELV W/ CM
2 of 5 series · 15 of 46 positions shown, 17 images · IV contrast (Omnipaque 300)
Comparison: Abdominal radiographs earlier this day.

CLINICAL DATA: Abdominal pain, nausea and vomiting. Right and left
lower quadrant pain with frequent vomiting since yesterday. Post
colectomy 10 days prior 08/24/2014.

EXAM:
CT ABDOMEN AND PELVIS WITH CONTRAST
TECHNIQUE: Multidetector CT imaging of the abdomen and pelvis was performed
using the standard protocol following bolus administration of
intravenous contrast.
CONTRAST:  50mL OMNIPAQUE IOHEXOL 300 MG/ML SOLN, 100mL OMNIPAQUE
IOHEXOL 300 MG/ML SOLN

[Series 2: abd_pel_with 5.0 b40f · axial · 0.67mm/px · z∈[-566,-186]mm · 12 of 88 slices shown, 14 images]
[im 6/88  soft-tissue]
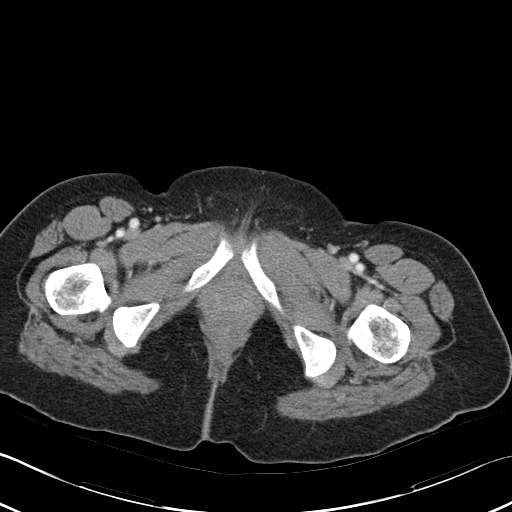
[im 6/88  bone]
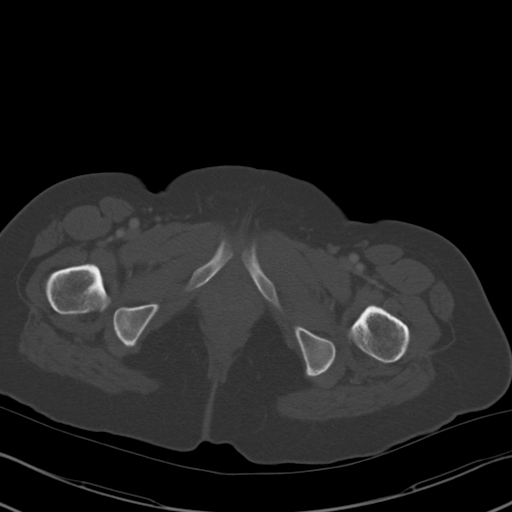
[im 16/88  soft-tissue]
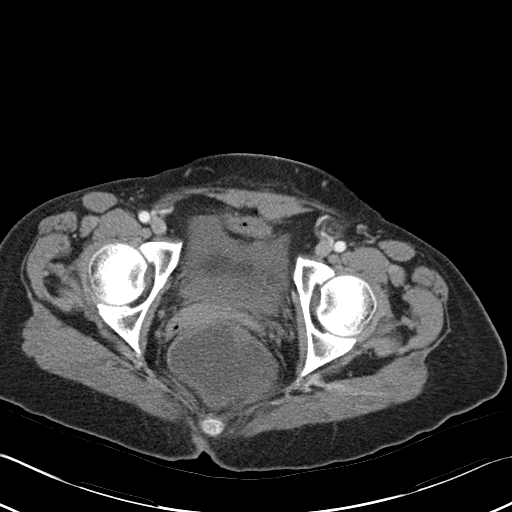
[im 21/88  soft-tissue]
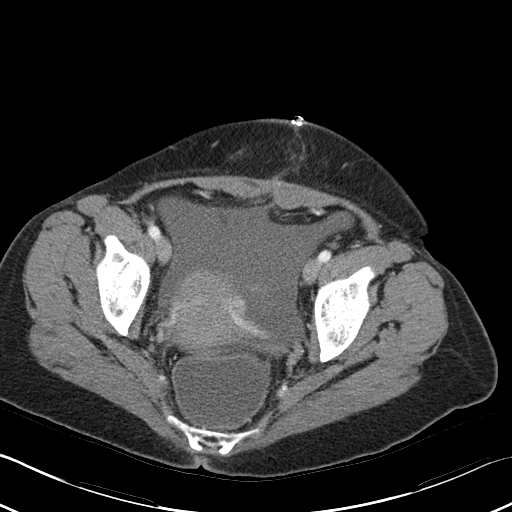
[im 26/88  soft-tissue]
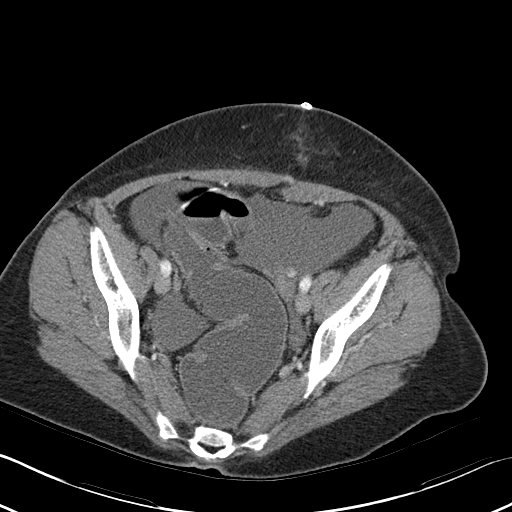
[im 36/88  soft-tissue]
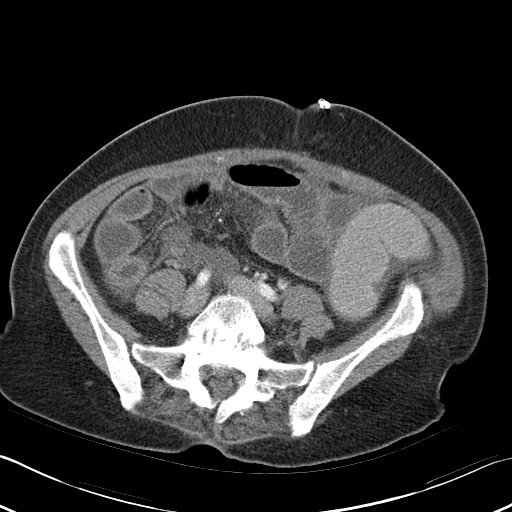
[im 41/88  soft-tissue]
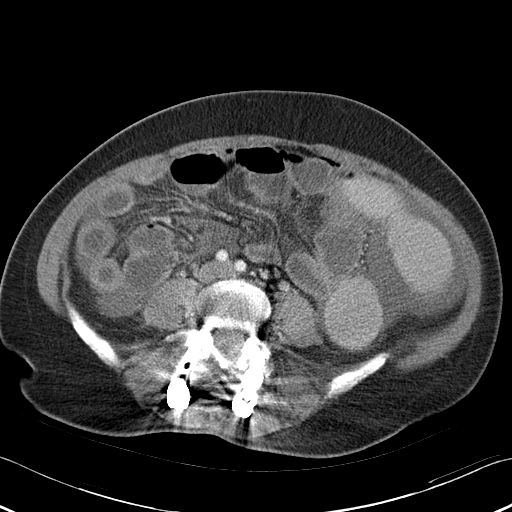
[im 47/88  soft-tissue]
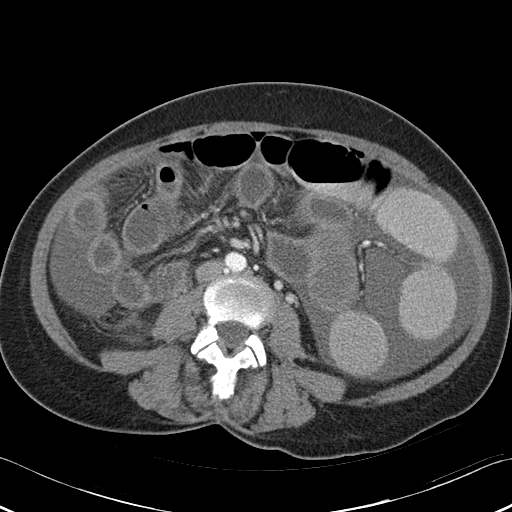
[im 57/88  soft-tissue]
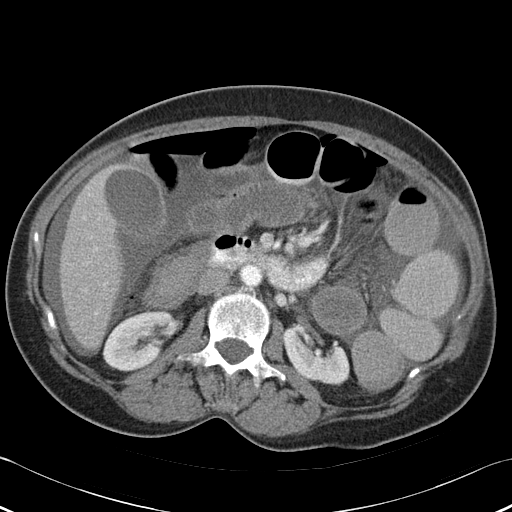
[im 62/88  soft-tissue]
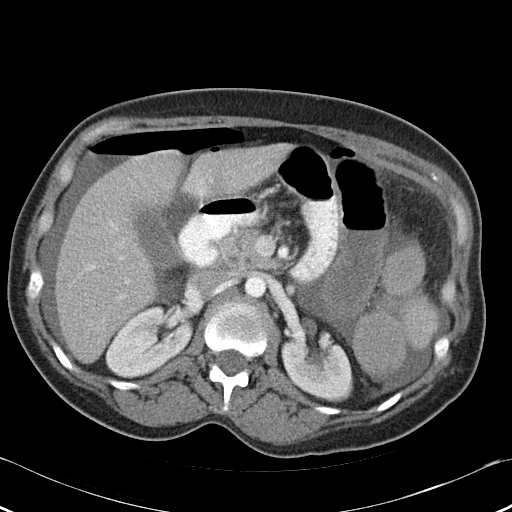
[im 62/88  bone]
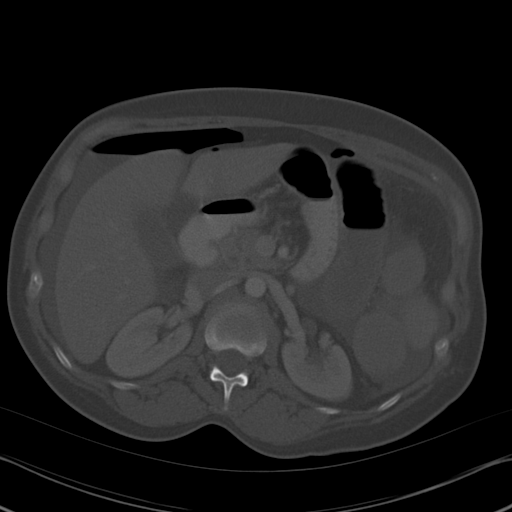
[im 67/88  soft-tissue]
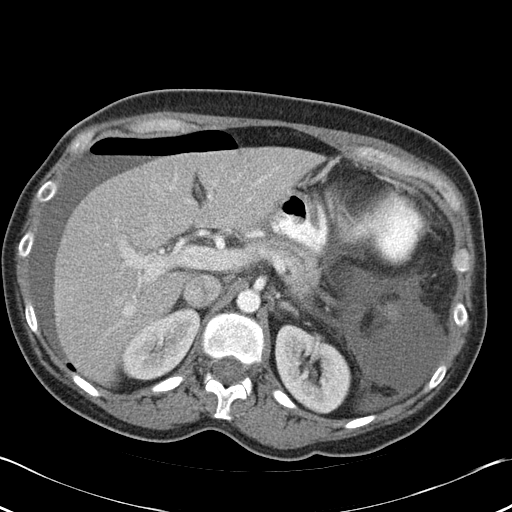
[im 77/88  soft-tissue]
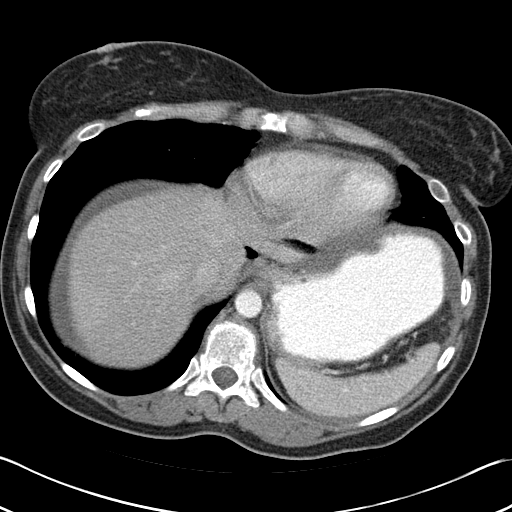
[im 82/88  soft-tissue]
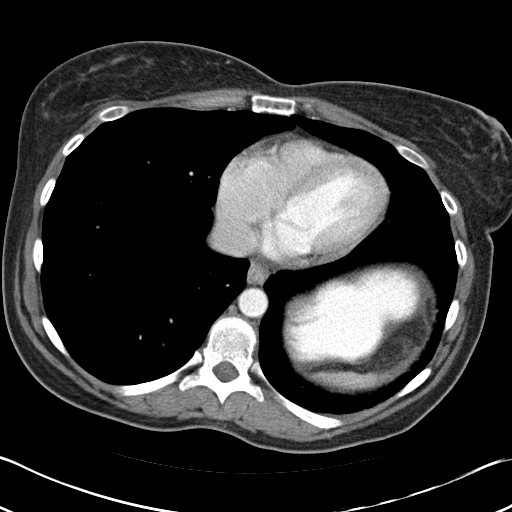

[Series 3: abd_pel_with 3.0 spo · coronal · 0.64mm/px · 3 of 92 slices shown]
[im 31/92  soft-tissue]
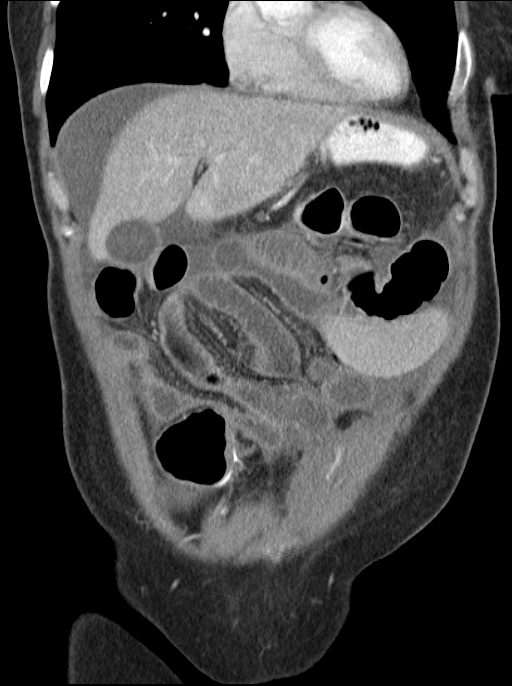
[im 41/92  soft-tissue]
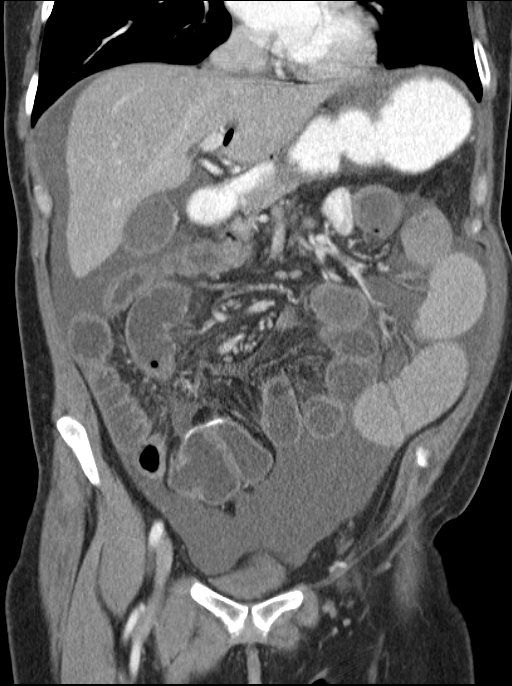
[im 51/92  soft-tissue]
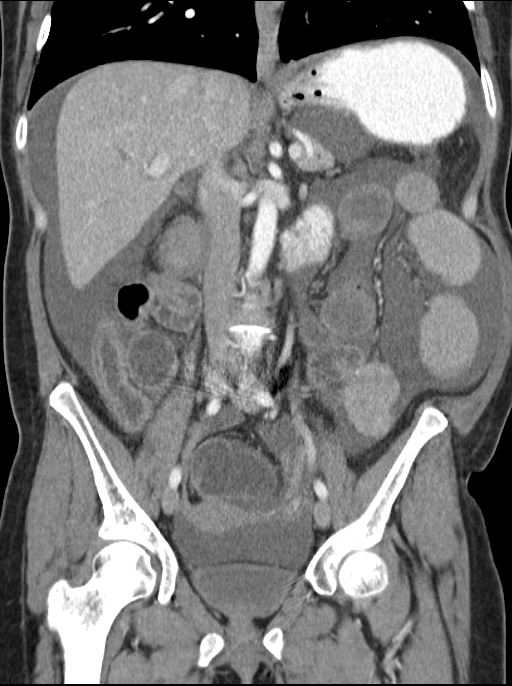

[15 of 46 positions shown; findings below may reference images not displayed]

FINDINGS: The included lung bases are clear.

As seen on prior radiographs there is moderate volume of free
intra-abdominal air primarily in the upper abdomen. There is a
moderate volume of intra-abdominal ascites. The stomach is distended
with ingested oral contrast. There is diffuse fluid-filled dilated
small bowel throughout. Distal small bowel with less dilated,
however has bowel wall thickening and mild enhancement. There is no
transition point. The ileal colonic anastomosis in the right lower
quadrant is widely patent. The in situ sigmoid colon is
fluid-filled. There is no definite air adjacent to the enteric
suture line. There is no loculated abscess. There is no pneumatosis
or portal venous gas.

The liver, gallbladder, spleen, pancreas, adrenal glands, and
kidneys are normal. Abdominal aorta is normal in caliber.

Within the pelvis the bladder is physiologically distended. Uterus
and adnexa are normal for age.

Midline abdominal skin staples noted. No subcutaneous fluid
collection.

Postsurgical change in the lumbosacral spine. No acute osseous
abnormality.
IMPRESSION: Intra-abdominal free air and free fluid, this is greater than
expected for 10 days post intra-abdominal surgery. Diffuse small
bowel dilatation, with small bowel thickening and enhancement
distally just proximal to the ileal colonic anastomosis. There is no
extraluminal oral contrast. No loculated collection to suggest
abscess. A site of bowel perforation is not definitively seen.

These results were called by telephone at the time of interpretation
on 09/03/2014 at [DATE] to Dr. DR.JESUS CHALPA , who verbally acknowledged
these results.

## 2016-04-30 ENCOUNTER — Encounter: Payer: Self-pay | Admitting: Gastroenterology

## 2016-06-05 ENCOUNTER — Encounter: Payer: Self-pay | Admitting: Gastroenterology

## 2016-06-05 ENCOUNTER — Ambulatory Visit (INDEPENDENT_AMBULATORY_CARE_PROVIDER_SITE_OTHER): Payer: Medicaid Other | Admitting: Gastroenterology

## 2016-06-05 DIAGNOSIS — R1310 Dysphagia, unspecified: Secondary | ICD-10-CM | POA: Diagnosis not present

## 2016-06-05 DIAGNOSIS — K3184 Gastroparesis: Secondary | ICD-10-CM | POA: Diagnosis not present

## 2016-06-05 DIAGNOSIS — K582 Mixed irritable bowel syndrome: Secondary | ICD-10-CM

## 2016-06-05 DIAGNOSIS — K219 Gastro-esophageal reflux disease without esophagitis: Secondary | ICD-10-CM

## 2016-06-05 LAB — TSH: TSH: 1.5 m[IU]/L

## 2016-06-05 NOTE — Assessment & Plan Note (Signed)
SYMPTOMS FAIRLY WELL CONTROLLED ON DIET.  CONTINUE TO MONITOR SYMPTOMS.

## 2016-06-05 NOTE — Assessment & Plan Note (Signed)
ASSOCIATED WITH WEIGHT GAIN AND SYMPTOMS NOT IDEALLY CONTROLLED AND LIKELY DUE TO LACTOSE INTOLERANCE . LESS LIKELY THYROID DISTURBANCE  DRINK WATER TO KEEP YOUR URINE LIGHT YELLOW. ADD LACTASE 3 PILLS WITH MEALS to reduce diarrhea. CONTINUE LINZESS and FIBER GUMMIES. COMPLETE YOUR LAB DRAW.  FOLLOW UP IN 6 MOS.

## 2016-06-05 NOTE — Assessment & Plan Note (Signed)
SYMPTOMS CONTROLLED/RESOLVED. WEIGHT UP- CONTINUE OMEPRAZOLE.  TAKE 30 MINUTES PRIOR TO YOUR FIRT MEAL. CONTINUE TO MONITOR SYMPTOMS.

## 2016-06-05 NOTE — Progress Notes (Signed)
Subjective:    Patient ID: Alicia Tyler, female    DOB: 08-17-1970, 45 y.o.   MRN: [id number]  Monico Blitz, MD  HPI BACK SURGERY DELAYED DUE TO TORNADO AND THEN HAD TO REPAIR AUNTS [place].  AUNT JUST GOT BACK HOME 5 MOS AGO. SCHOOL STARTED BACK UP AND NOW CAN'T HAVE IT BECAUSE NOONE TO DRIVE DAUGHTER TO SCHOOL. CONCERNED ABOUT HER WEIGHT. FLU SHOT UP TO DATE. BMs: SAME(3-4/DAY-#6-7). ACCIDENTS: 1-2X/MO(IN BED AT NIGHT). MILK: 2% 3-4X/WK. CHEESE: 3X/WEEK ICE CREAM: RARE. TRAVEL:NO. ABX: LAST MO-DOXYCYCLINE FOR KNOT ON BACK OF NECK. RECENT RUNNY NOSE, COUGH AND COLD-MUCINEX DM, CRAMPY ABDOMINAL BEFORE BMs AND AFTER HAS BM IT'S GONE. OCCASIONAL NAUSEA.  RARE VOMITING: 3X/MO. WEIGHT UP: AUG 2016 128 LBS, DEC 2016 143 LBS DEC 2017: 157 LBS.  PT DENIES FEVER, CHILLS, HEMATOCHEZIA, melena,CHEST PAIN, SHORTNESS OF BREATH, CHANGE IN BOWEL IN HABITS, constipation, problems swallowing, OR heartburn or indigestion.  Past Medical History:  Diagnosis Date  . Anxiety   . BMI (body mass index) 20.0-29.01 Apr 2010 172 LBS  . Chronic back pain   . Degenerative disc disease, thoracic   . Headache(784.0)   . Neuropathy ([place])   . PONV (postoperative nausea and vomiting)   . Pseudoobstruction of colon 08/24/2014  . Seasonal allergies   . Serrated adenoma of colon DEC 2011    Past Surgical History:  Procedure Laterality Date  . ABLATION     endometrial ablation  . APPENDECTOMY    . BACK SURGERY  05/14/2011   Lumbar Fusion and cage  . BIOPSY  06/22/2015   Procedure: BIOPSY;  Surgeon: Danie Binder, MD;  [facility];  Service: Endoscopy;;  gastric and esophageal bx's  . BREAST LUMPECTOMY     from the left breast  . COLECTOMY N/A 08/24/2014   Procedure: TOTAL COLECTOMY;  Surgeon: Jamesetta So, MD;  Location: [facility];  Service: General;  Laterality: N/A;  . COLONOSCOPY  NOV 2011 SCREENING    HYPERPLASTIC RECTAL POLYP, SML IH, incomplete due to discomfort  . COLONOSCOPY  DEC 2011  w/ PROPOFOL   SERRATED ADENOMA, HYPERPLASTIC POLY[S[  . ESOPHAGOGASTRODUODENOSCOPY (EGD) WITH PROPOFOL N/A 06/22/2015   Procedure: ESOPHAGOGASTRODUODENOSCOPY (EGD) WITH PROPOFOL;  Surgeon: Danie Binder, MD;  [facility];  Service: Endoscopy;  Laterality: N/A;  . FLEXIBLE SIGMOIDOSCOPY N/A 06/22/2015   Procedure: FLEXIBLE SIGMOIDOSCOPY WITH PROPOFOL;  Surgeon: Danie Binder, MD;  [facility];  Service: Endoscopy;  Laterality: N/A;  0830   . KNEE SURGERY Bilateral    2 on righ-1 scope and one open; and one on left  . MOUTH SURGERY  07/21/2013  . OVARIAN CYST REMOVAL    . SMART PILL PROCEDURE N/A 07/04/2014   Procedure: SMART PILL PROCEDURE;  Surgeon: Danie Binder, MD;  [facility];  Service: Endoscopy;  Laterality: N/A;  800  . TUBAL LIGATION     Allergies  Allergen Reactions  . Bee Venom Anaphylaxis       . Beesix [Pyridoxine Hcl] Anaphylaxis  . Penicillins Other (See Comments)    Makes hair fall out  . Aspirin Swelling and Rash  . Codeine Nausea And Vomiting and Rash    Current Outpatient Prescriptions  Medication Sig Dispense Refill  . ALPRAZolam (XANAX) 0.5 MG tablet Take 1 tablet (0.5 mg total) by mouth daily.    . Biotin 5000 MCG CAPS Take 5,000 mcg by mouth 2 (two) times daily.      Marland Kitchen  Cholecalciferol (VITAMIN D) 2000 UNITS tablet Take 2,000 Units by mouth daily.      . cycloSPORINE (RESTASIS) 0.05 % ophthalmic emulsion Place 1 drop into both eyes 2 (two) times daily.    . diazepam (VALIUM) 10 MG tablet Take 1 tablet (10 mg total) by mouth every 8 (eight) hours as needed (muscle spasms).    . FIBER SELECT GUMMIES PO Take 2 tablets by mouth 2 (two) times daily.     . fluticasone (FLONASE) 50 MCG/ACT nasal spray Place 1 spray into both nostrils 2 (two) times daily.    . furosemide (LASIX) 20 MG tablet Take 20 mg by mouth.    . gabapentin (NEURONTIN) 300 MG capsule Take 300 mg by mouth 3 (three) times daily.    Marland Kitchen LINZESS 290 MCG CAPS  capsule TAKE 1 CAPSULE BY MOUTH EVERY DAY 30 MINUTES PRIOR TO BREAKFAST    . loratadine (CLARITIN) 10 MG tablet Take 10 mg by mouth daily.      Marland Kitchen loteprednol (ALREX) 0.2 % SUSP Place 1 drop into both eyes 2 (two) times daily.    . meloxicam (MOBIC) 15 MG tablet Take 15 mg by mouth daily.    . NON FORMULARY Compound nasal spray    . Olopatadine HCl 0.2 % SOLN Place 1 drop into both eyes 2 (two) times daily.    Marland Kitchen omeprazole (PRILOSEC) 20 MG capsule Take 1 capsule (20 mg total) by mouth daily.    . Oxycodone HCl 10 MG TABS Take 10 mg by mouth every 4 (four) hours as needed (pain).    . SUMAtriptan (IMITREX) 100 MG tablet Take 100 mg by mouth every 2 (two) hours as needed for migraine or headache. May repeat in 2 hours if headache persists or recurs.    . topiramate (TOPAMAX) 50 MG tablet Take 50 mg by mouth 2 (two) times daily.     .       Review of Systems PER HPI OTHERWISE ALL SYSTEMS ARE NEGATIVE.    Objective:   Physical Exam  Constitutional: She is oriented to person, place, and time. She appears well-developed and well-nourished. No distress.  HENT:  Head: Normocephalic and atraumatic.  Mouth/Throat: Oropharynx is clear and moist. No oropharyngeal exudate.  Eyes: Pupils are equal, round, and reactive to light. No scleral icterus.  Neck: Normal range of motion. Neck supple.  Cardiovascular: Normal rate, regular rhythm and normal heart sounds.   Pulmonary/Chest: Effort normal and breath sounds normal. No respiratory distress.  Abdominal: Soft. Bowel sounds are normal. She exhibits no distension. There is no tenderness.  Musculoskeletal: She exhibits no edema.  Lymphadenopathy:    She has no cervical adenopathy.  Neurological: She is alert and oriented to person, place, and time.  NO FOCAL DEFICITS  Psychiatric: She has a normal mood and affect.  Vitals reviewed.     Assessment & Plan:

## 2016-06-05 NOTE — Patient Instructions (Signed)
DRINK WATER TO KEEP YOUR URINE LIGHT YELLOW.  ADD LACTASE 3 PILLS WITH MEALS to reduce diarrhea.  CONTINUE LINZESS and FIBER GUMMIES.  COMPLETE YOUR LAB DRAW.  FOLLOW UP IN 6 MOS.

## 2016-06-05 NOTE — Assessment & Plan Note (Signed)
SYMPTOMS CONTROLLED/RESOLVED.  CONTINUE TO MONITOR SYMPTOMS. 

## 2016-06-05 NOTE — Progress Notes (Signed)
ON RECALL  °

## 2016-08-21 ENCOUNTER — Other Ambulatory Visit: Payer: Self-pay | Admitting: Neurological Surgery

## 2016-08-26 ENCOUNTER — Encounter (HOSPITAL_COMMUNITY): Payer: Self-pay

## 2016-08-26 NOTE — Pre-Procedure Instructions (Signed)
Alicia Tyler  08/26/2016      [facility] - [place], [place] - [address] [place] [address] Phone: [phone] Fax: [phone]    Your procedure is scheduled on Tuesday, March 13.  Report to [facility] Admitting at 1:15 PM                 Your surgery or procedure is scheduled for 3:15 PM   Call this number if you have problems the morning of surgery: [phone]                For any other questions, please call [phone], Monday - Friday 8 AM - 4 PM.    Remember:  Do not eat food or drink liquids after midnight Monday, March 12.  Take these medicines the morning of surgery with A SIP OF WATER :gabapentin (NEURONTIN), loratadine (CLARITIN), omeprazole (PRILOSEC), topiramate (TOPAMAX).  May use Eye drops.                   Take if needed: SUMAtriptan (IMITREX), Oxycodone,  ALPRAZolam Duanne Moron).              1 Week prior to surgery STOP taking Aspirin, Aspirin Products (Goody Powder, Excedrin Migraine), Ibuprofen (Advil), Naproxen (Aleve),meloxicam (MOBIC),  Vitamins and Herbal Products (ie Fish Oil)               [facility]- Preparing For Surgery  Before surgery, you can play an important role. Because skin is not sterile, your skin needs to be as free of germs as possible. You can reduce the number of germs on your skin by washing with CHG (chlorahexidine gluconate) Soap before surgery.  CHG is an antiseptic cleaner which kills germs and bonds with the skin to continue killing germs even after washing.  Please do not use if you have an allergy to CHG or antibacterial soaps. If your skin becomes reddened/irritated stop using the CHG.  Do not shave (including legs and underarms) for at least 48 hours prior to first CHG shower. It is OK to shave your face.  Please follow these instructions carefully.   1. Shower the NIGHT BEFORE SURGERY and the MORNING OF SURGERY with CHG.   2. If you chose to wash your hair, wash your hair first as usual with your  normal shampoo.  3. After you shampoo, rinse your hair and body thoroughly to remove the shampoo.  4. Use CHG as you would any other liquid soap. You can apply CHG directly to the skin and wash gently with a scrungie or a clean washcloth.   5. Apply the CHG Soap to your body ONLY FROM THE NECK DOWN.  Do not use on open wounds or open sores. Avoid contact with your eyes, ears, mouth and genitals (private parts). Wash genitals (private parts) with your normal soap.  6. Wash thoroughly, paying special attention to the area where your surgery will be performed.  7. Thoroughly rinse your body with warm water from the neck down.  8. DO NOT shower/wash with your normal soap after using and rinsing off the CHG Soap.  9. Pat yourself dry with a CLEAN TOWEL.   10. Wear CLEAN PAJAMAS   11. Place CLEAN SHEETS on your bed the night of your first shower and DO NOT SLEEP WITH PETS. Day of Surgery: Do not apply any deodorants/lotions. Please wear clean clothes to the hospital/surgery center.    Do not wear  jewelry, make-up or nail polish.  Do not wear lotions, powders, or perfumes, or deodorant.  Do not shave 48 hours prior to surgery.  Men may shave face and neck.  Do not bring valuables to the hospital.  [facility] is not responsible for any belongings or valuables.  Contacts, dentures or bridgework may not be worn into surgery.  Leave your suitcase in the car.  After surgery it may be brought to your room.  For patients admitted to the hospital, discharge time will be determined by your treatment team.  Special instructions: -  Please read over the following fact sheets that you were given. [facility]- Preparing For Surgery and Patient Instructions for Mupirocin Application, Coughing and Deep Breathing, Pain Booklet

## 2016-08-27 ENCOUNTER — Encounter (HOSPITAL_COMMUNITY)
Admission: RE | Admit: 2016-08-27 | Discharge: 2016-08-27 | Disposition: A | Discharge status: Home / Self Care | Payer: Medicaid Other | Source: Ambulatory Visit | Attending: Neurological Surgery | Admitting: Neurological Surgery

## 2016-08-27 ENCOUNTER — Encounter (HOSPITAL_COMMUNITY): Payer: Self-pay

## 2016-08-27 DIAGNOSIS — M5416 Radiculopathy, lumbar region: Secondary | ICD-10-CM | POA: Diagnosis not present

## 2016-08-27 DIAGNOSIS — Z01812 Encounter for preprocedural laboratory examination: Secondary | ICD-10-CM | POA: Insufficient documentation

## 2016-08-27 HISTORY — DX: Full incontinence of feces: R15.9

## 2016-08-27 LAB — TYPE AND SCREEN
ABO/RH(D): A POS
ANTIBODY SCREEN: NEGATIVE

## 2016-08-27 LAB — CBC
HCT: 42.2 % (ref 36.0–46.0)
Hemoglobin: 13.7 g/dL (ref 12.0–15.0)
MCH: 29.2 pg (ref 26.0–34.0)
MCHC: 32.5 g/dL (ref 30.0–36.0)
MCV: 90 fL (ref 78.0–100.0)
PLATELETS: 268 10*3/uL (ref 150–400)
RBC: 4.69 MIL/uL (ref 3.87–5.11)
RDW: 13.4 % (ref 11.5–15.5)
WBC: 6.7 10*3/uL (ref 4.0–10.5)

## 2016-08-27 LAB — BASIC METABOLIC PANEL
Anion gap: 9 (ref 5–15)
BUN: 12 mg/dL (ref 6–20)
CO2: 23 mmol/L (ref 22–32)
CREATININE: 0.9 mg/dL (ref 0.44–1.00)
Calcium: 10.3 mg/dL (ref 8.9–10.3)
Chloride: 109 mmol/L (ref 101–111)
GFR calc Af Amer: >60 mL/min (ref >60)
GLUCOSE: 101 mg/dL — AB (ref 65–99)
Potassium: 4.2 mmol/L (ref 3.5–5.1)
SODIUM: 141 mmol/L (ref 135–145)

## 2016-08-27 LAB — SURGICAL PCR SCREEN
MRSA, PCR: POSITIVE — AB
Staphylococcus aureus: POSITIVE — AB

## 2016-09-02 MED ORDER — VANCOMYCIN HCL IN DEXTROSE 1-5 GM/200ML-% IV SOLN
1000.0000 mg | INTRAVENOUS | Status: DC
  Filled 2016-09-02: qty 200

## 2016-09-03 ENCOUNTER — Encounter (HOSPITAL_COMMUNITY)
Admission: RE | Disposition: A | Discharge status: Home / Self Care | Payer: Self-pay | Source: Ambulatory Visit | Attending: Neurological Surgery

## 2016-09-03 ENCOUNTER — Inpatient Hospital Stay (HOSPITAL_COMMUNITY): Payer: Medicaid Other

## 2016-09-03 ENCOUNTER — Inpatient Hospital Stay (HOSPITAL_COMMUNITY): Payer: Medicaid Other | Admitting: Anesthesiology

## 2016-09-03 ENCOUNTER — Observation Stay (HOSPITAL_COMMUNITY)
Admission: RE | Admit: 2016-09-03 | Discharge: 2016-09-04 | Disposition: A | Discharge status: Home / Self Care | Payer: Medicaid Other | Source: Ambulatory Visit | Attending: Neurological Surgery | Admitting: Neurological Surgery

## 2016-09-03 ENCOUNTER — Encounter (HOSPITAL_COMMUNITY): Payer: Self-pay | Admitting: *Deleted

## 2016-09-03 DIAGNOSIS — F17210 Nicotine dependence, cigarettes, uncomplicated: Secondary | ICD-10-CM | POA: Insufficient documentation

## 2016-09-03 DIAGNOSIS — G629 Polyneuropathy, unspecified: Secondary | ICD-10-CM | POA: Insufficient documentation

## 2016-09-03 DIAGNOSIS — Z791 Long term (current) use of non-steroidal anti-inflammatories (NSAID): Secondary | ICD-10-CM | POA: Diagnosis not present

## 2016-09-03 DIAGNOSIS — S32009K Unspecified fracture of unspecified lumbar vertebra, subsequent encounter for fracture with nonunion: Secondary | ICD-10-CM | POA: Diagnosis present

## 2016-09-03 DIAGNOSIS — M960 Pseudarthrosis after fusion or arthrodesis: Secondary | ICD-10-CM | POA: Diagnosis not present

## 2016-09-03 DIAGNOSIS — Y838 Other surgical procedures as the cause of abnormal reaction of the patient, or of later complication, without mention of misadventure at the time of the procedure: Secondary | ICD-10-CM | POA: Diagnosis not present

## 2016-09-03 DIAGNOSIS — J302 Other seasonal allergic rhinitis: Secondary | ICD-10-CM | POA: Insufficient documentation

## 2016-09-03 DIAGNOSIS — Z7951 Long term (current) use of inhaled steroids: Secondary | ICD-10-CM | POA: Insufficient documentation

## 2016-09-03 DIAGNOSIS — Z79899 Other long term (current) drug therapy: Secondary | ICD-10-CM | POA: Diagnosis not present

## 2016-09-03 DIAGNOSIS — R51 Headache: Secondary | ICD-10-CM | POA: Diagnosis not present

## 2016-09-03 DIAGNOSIS — F419 Anxiety disorder, unspecified: Secondary | ICD-10-CM | POA: Insufficient documentation

## 2016-09-03 DIAGNOSIS — Z9049 Acquired absence of other specified parts of digestive tract: Secondary | ICD-10-CM | POA: Insufficient documentation

## 2016-09-03 DIAGNOSIS — M549 Dorsalgia, unspecified: Secondary | ICD-10-CM | POA: Diagnosis present

## 2016-09-03 DIAGNOSIS — K219 Gastro-esophageal reflux disease without esophagitis: Secondary | ICD-10-CM | POA: Insufficient documentation

## 2016-09-03 DIAGNOSIS — Z419 Encounter for procedure for purposes other than remedying health state, unspecified: Secondary | ICD-10-CM

## 2016-09-03 HISTORY — PX: HARDWARE REMOVAL: SHX979

## 2016-09-03 SURGERY — REMOVAL, HARDWARE
Anesthesia: General | Site: Back

## 2016-09-03 MED ORDER — FENTANYL CITRATE (PF) 100 MCG/2ML IJ SOLN
INTRAMUSCULAR | Status: AC
  Filled 2016-09-03: qty 4

## 2016-09-03 MED ORDER — FENTANYL CITRATE (PF) 100 MCG/2ML IJ SOLN
INTRAMUSCULAR | Status: DC | PRN
Start: 2016-09-03 — End: 2016-09-03
  Administered 2016-09-03: 100 ug via INTRAVENOUS
  Administered 2016-09-03 (×2): 50 ug via INTRAVENOUS

## 2016-09-03 MED ORDER — FENTANYL CITRATE (PF) 100 MCG/2ML IJ SOLN: 25.0000 ug | INTRAMUSCULAR | Status: DC | PRN

## 2016-09-03 MED ORDER — MIDAZOLAM HCL 2 MG/2ML IJ SOLN
INTRAMUSCULAR | Status: AC
  Filled 2016-09-03: qty 2

## 2016-09-03 MED ORDER — HEMOSTATIC AGENTS (NO CHARGE) OPTIME
TOPICAL | Status: DC | PRN
  Administered 2016-09-03: 1 via TOPICAL

## 2016-09-03 MED ORDER — PHENYLEPHRINE 40 MCG/ML (10ML) SYRINGE FOR IV PUSH (FOR BLOOD PRESSURE SUPPORT)
PREFILLED_SYRINGE | INTRAVENOUS | Status: AC
  Filled 2016-09-03: qty 10

## 2016-09-03 MED ORDER — OLOPATADINE HCL 0.1 % OP SOLN
1.0000 [drp] | Freq: Every day | OPHTHALMIC | Status: DC
Start: 2016-09-04 — End: 2016-09-04
  Administered 2016-09-04: 1 [drp] via OPHTHALMIC
  Filled 2016-09-03: qty 5

## 2016-09-03 MED ORDER — POLYETHYLENE GLYCOL 3350 17 G PO PACK: 17.0000 g | PACK | Freq: Every day | ORAL | Status: DC | PRN

## 2016-09-03 MED ORDER — FUROSEMIDE 20 MG PO TABS: 20.0000 mg | ORAL_TABLET | ORAL | Status: DC

## 2016-09-03 MED ORDER — LINACLOTIDE 290 MCG PO CAPS
290.0000 ug | ORAL_CAPSULE | Freq: Every day | ORAL | Status: DC
  Administered 2016-09-04: 290 ug via ORAL
  Filled 2016-09-03: qty 1

## 2016-09-03 MED ORDER — METHOCARBAMOL 500 MG PO TABS: 500.0000 mg | ORAL_TABLET | Freq: Four times a day (QID) | ORAL | Status: DC | PRN

## 2016-09-03 MED ORDER — CHLORHEXIDINE GLUCONATE CLOTH 2 % EX PADS: 6.0000 | MEDICATED_PAD | Freq: Once | CUTANEOUS | Status: DC

## 2016-09-03 MED ORDER — SODIUM CHLORIDE 0.9 % IV SOLN: 250.0000 mL | INTRAVENOUS | Status: DC

## 2016-09-03 MED ORDER — SENNA 8.6 MG PO TABS
1.0000 | ORAL_TABLET | Freq: Two times a day (BID) | ORAL | Status: DC
  Filled 2016-09-03 (×2): qty 1

## 2016-09-03 MED ORDER — ONDANSETRON HCL 4 MG PO TABS: 4.0000 mg | ORAL_TABLET | Freq: Four times a day (QID) | ORAL | Status: DC | PRN

## 2016-09-03 MED ORDER — SUMATRIPTAN SUCCINATE 50 MG PO TABS
100.0000 mg | ORAL_TABLET | ORAL | Status: DC | PRN
  Filled 2016-09-03: qty 2

## 2016-09-03 MED ORDER — BISACODYL 10 MG RE SUPP: 10.0000 mg | Freq: Every day | RECTAL | Status: DC | PRN

## 2016-09-03 MED ORDER — LACTATED RINGERS IV SOLN
INTRAVENOUS | Status: DC
  Administered 2016-09-03 (×2): via INTRAVENOUS

## 2016-09-03 MED ORDER — LIDOCAINE HCL (CARDIAC) 20 MG/ML IV SOLN
INTRAVENOUS | Status: DC | PRN
  Administered 2016-09-03: 80 mg via INTRAVENOUS

## 2016-09-03 MED ORDER — ONDANSETRON HCL 4 MG/2ML IJ SOLN
INTRAMUSCULAR | Status: DC | PRN
  Administered 2016-09-03: 4 mg via INTRAVENOUS

## 2016-09-03 MED ORDER — METHOCARBAMOL 1000 MG/10ML IJ SOLN
500.0000 mg | Freq: Four times a day (QID) | INTRAVENOUS | Status: DC | PRN
  Filled 2016-09-03: qty 5

## 2016-09-03 MED ORDER — ALPRAZOLAM 0.5 MG PO TABS: 0.5000 mg | ORAL_TABLET | Freq: Every day | ORAL | Status: DC | PRN

## 2016-09-03 MED ORDER — LIDOCAINE-EPINEPHRINE 2 %-1:100000 IJ SOLN
INTRAMUSCULAR | Status: AC
  Filled 2016-09-03: qty 1

## 2016-09-03 MED ORDER — FLUTICASONE PROPIONATE 50 MCG/ACT NA SUSP
1.0000 | Freq: Two times a day (BID) | NASAL | Status: DC
  Administered 2016-09-04: 1 via NASAL
  Filled 2016-09-03: qty 16

## 2016-09-03 MED ORDER — PANTOPRAZOLE SODIUM 40 MG PO TBEC
40.0000 mg | DELAYED_RELEASE_TABLET | Freq: Every day | ORAL | Status: DC
  Administered 2016-09-04: 40 mg via ORAL
  Filled 2016-09-03: qty 1

## 2016-09-03 MED ORDER — OXYCODONE HCL 5 MG PO TABS: 5.0000 mg | ORAL_TABLET | ORAL | 0 refills | Status: DC | PRN

## 2016-09-03 MED ORDER — SUCCINYLCHOLINE CHLORIDE 200 MG/10ML IV SOSY
PREFILLED_SYRINGE | INTRAVENOUS | Status: AC
  Filled 2016-09-03: qty 20

## 2016-09-03 MED ORDER — PROPOFOL 10 MG/ML IV BOLUS
INTRAVENOUS | Status: AC
  Filled 2016-09-03: qty 20

## 2016-09-03 MED ORDER — ROCURONIUM BROMIDE 50 MG/5ML IV SOSY
PREFILLED_SYRINGE | INTRAVENOUS | Status: AC
  Filled 2016-09-03: qty 5

## 2016-09-03 MED ORDER — PROMETHAZINE HCL 25 MG/ML IJ SOLN: 6.2500 mg | INTRAMUSCULAR | Status: DC | PRN

## 2016-09-03 MED ORDER — ROCURONIUM BROMIDE 100 MG/10ML IV SOLN
INTRAVENOUS | Status: DC | PRN
  Administered 2016-09-03: 50 mg via INTRAVENOUS

## 2016-09-03 MED ORDER — VANCOMYCIN HCL 1000 MG IV SOLR
INTRAVENOUS | Status: DC | PRN
  Administered 2016-09-03: 1000 mg via INTRAVENOUS

## 2016-09-03 MED ORDER — OXYCODONE HCL 5 MG PO TABS
10.0000 mg | ORAL_TABLET | Freq: Three times a day (TID) | ORAL | Status: DC
  Administered 2016-09-03 – 2016-09-04 (×2): 10 mg via ORAL
  Filled 2016-09-03 (×2): qty 2

## 2016-09-03 MED ORDER — BUPIVACAINE HCL (PF) 0.5 % IJ SOLN
INTRAMUSCULAR | Status: DC | PRN
  Administered 2016-09-03: 5 mL

## 2016-09-03 MED ORDER — LIDOCAINE-EPINEPHRINE 1 %-1:100000 IJ SOLN
INTRAMUSCULAR | Status: DC | PRN
  Administered 2016-09-03: 5 mL

## 2016-09-03 MED ORDER — SODIUM CHLORIDE 0.9 % IR SOLN
Status: DC | PRN
  Administered 2016-09-03: 500 mL

## 2016-09-03 MED ORDER — DIAZEPAM 5 MG PO TABS
5.0000 mg | ORAL_TABLET | Freq: Four times a day (QID) | ORAL | Status: DC | PRN
  Administered 2016-09-03 – 2016-09-04 (×3): 5 mg via ORAL
  Filled 2016-09-03 (×3): qty 1

## 2016-09-03 MED ORDER — HYDROMORPHONE HCL 1 MG/ML IJ SOLN: 0.5000 mg | INTRAMUSCULAR | Status: DC | PRN

## 2016-09-03 MED ORDER — ALUM & MAG HYDROXIDE-SIMETH 200-200-20 MG/5ML PO SUSP: 30.0000 mL | Freq: Four times a day (QID) | ORAL | Status: DC | PRN

## 2016-09-03 MED ORDER — OXYCODONE HCL 5 MG PO TABS
5.0000 mg | ORAL_TABLET | ORAL | Status: DC | PRN
  Administered 2016-09-03 – 2016-09-04 (×3): 10 mg via ORAL
  Filled 2016-09-03 (×2): qty 2

## 2016-09-03 MED ORDER — 0.9 % SODIUM CHLORIDE (POUR BTL) OPTIME
TOPICAL | Status: DC | PRN
  Administered 2016-09-03: 1000 mL

## 2016-09-03 MED ORDER — THROMBIN 5000 UNITS EX SOLR
CUTANEOUS | Status: AC
  Filled 2016-09-03: qty 10000

## 2016-09-03 MED ORDER — DOCUSATE SODIUM 100 MG PO CAPS
100.0000 mg | ORAL_CAPSULE | Freq: Two times a day (BID) | ORAL | Status: DC
Start: 2016-09-03 — End: 2016-09-04
  Filled 2016-09-03 (×2): qty 1

## 2016-09-03 MED ORDER — SUCCINYLCHOLINE CHLORIDE 20 MG/ML IJ SOLN
INTRAMUSCULAR | Status: DC | PRN
  Administered 2016-09-03: 120 mg via INTRAVENOUS

## 2016-09-03 MED ORDER — ACETAMINOPHEN 650 MG RE SUPP: 650.0000 mg | RECTAL | Status: DC | PRN

## 2016-09-03 MED ORDER — MIDAZOLAM HCL 5 MG/5ML IJ SOLN
INTRAMUSCULAR | Status: DC | PRN
  Administered 2016-09-03: 2 mg via INTRAVENOUS

## 2016-09-03 MED ORDER — EPHEDRINE SULFATE 50 MG/ML IJ SOLN
INTRAMUSCULAR | Status: DC | PRN
  Administered 2016-09-03 (×2): 5 mg via INTRAVENOUS

## 2016-09-03 MED ORDER — GABAPENTIN 300 MG PO CAPS
300.0000 mg | ORAL_CAPSULE | Freq: Three times a day (TID) | ORAL | Status: DC
  Administered 2016-09-03 – 2016-09-04 (×2): 300 mg via ORAL
  Filled 2016-09-03 (×2): qty 1

## 2016-09-03 MED ORDER — PSYLLIUM 95 % PO PACK
PACK | Freq: Two times a day (BID) | ORAL | Status: DC
  Administered 2016-09-03: 22:00:00 via ORAL
  Filled 2016-09-03 (×3): qty 1

## 2016-09-03 MED ORDER — TOPIRAMATE 25 MG PO TABS
50.0000 mg | ORAL_TABLET | Freq: Two times a day (BID) | ORAL | Status: DC
  Administered 2016-09-03 – 2016-09-04 (×2): 50 mg via ORAL
  Filled 2016-09-03 (×2): qty 2

## 2016-09-03 MED ORDER — SODIUM CHLORIDE 0.9% FLUSH: 3.0000 mL | INTRAVENOUS | Status: DC | PRN

## 2016-09-03 MED ORDER — ONDANSETRON HCL 4 MG/2ML IJ SOLN
4.0000 mg | Freq: Four times a day (QID) | INTRAMUSCULAR | Status: DC | PRN
  Administered 2016-09-04: 4 mg via INTRAVENOUS
  Filled 2016-09-03: qty 2

## 2016-09-03 MED ORDER — VANCOMYCIN HCL 1000 MG IV SOLR
INTRAVENOUS | Status: AC
  Filled 2016-09-03: qty 1000

## 2016-09-03 MED ORDER — THROMBIN 5000 UNITS EX SOLR
CUTANEOUS | Status: DC | PRN
  Administered 2016-09-03 (×2): 5000 [IU] via TOPICAL

## 2016-09-03 MED ORDER — PHENOL 1.4 % MT LIQD
1.0000 | OROMUCOSAL | Status: DC | PRN
Start: 2016-09-03 — End: 2016-09-04

## 2016-09-03 MED ORDER — MENTHOL 3 MG MT LOZG: 1.0000 | LOZENGE | OROMUCOSAL | Status: DC | PRN

## 2016-09-03 MED ORDER — SUGAMMADEX SODIUM 200 MG/2ML IV SOLN
INTRAVENOUS | Status: DC | PRN
  Administered 2016-09-03: 150 mg via INTRAVENOUS

## 2016-09-03 MED ORDER — PHENYLEPHRINE HCL 10 MG/ML IJ SOLN
INTRAMUSCULAR | Status: DC | PRN
  Administered 2016-09-03 (×3): 80 ug via INTRAVENOUS
  Administered 2016-09-03: 120 ug via INTRAVENOUS
  Administered 2016-09-03 (×3): 80 ug via INTRAVENOUS
  Administered 2016-09-03 (×2): 120 ug via INTRAVENOUS
  Administered 2016-09-03: 80 ug via INTRAVENOUS
  Administered 2016-09-03: 120 ug via INTRAVENOUS

## 2016-09-03 MED ORDER — SODIUM CHLORIDE 0.9% FLUSH
3.0000 mL | Freq: Two times a day (BID) | INTRAVENOUS | Status: DC
  Administered 2016-09-03: 3 mL via INTRAVENOUS

## 2016-09-03 MED ORDER — FLEET ENEMA 7-19 GM/118ML RE ENEM: 1.0000 | ENEMA | Freq: Once | RECTAL | Status: DC | PRN

## 2016-09-03 MED ORDER — PROPOFOL 10 MG/ML IV BOLUS
INTRAVENOUS | Status: DC | PRN
  Administered 2016-09-03: 150 mg via INTRAVENOUS

## 2016-09-03 MED ORDER — BUPIVACAINE HCL (PF) 0.5 % IJ SOLN
INTRAMUSCULAR | Status: AC
  Filled 2016-09-03: qty 30

## 2016-09-03 MED ORDER — EPHEDRINE 5 MG/ML INJ
INTRAVENOUS | Status: AC
  Filled 2016-09-03: qty 20

## 2016-09-03 MED ORDER — MUPIROCIN CALCIUM 2 % EX CREA
TOPICAL_CREAM | CUTANEOUS | Status: AC
  Filled 2016-09-03: qty 15

## 2016-09-03 MED ORDER — MELOXICAM 7.5 MG PO TABS
15.0000 mg | ORAL_TABLET | Freq: Every day | ORAL | Status: DC
  Administered 2016-09-04: 15 mg via ORAL
  Filled 2016-09-03: qty 1

## 2016-09-03 MED ORDER — ACETAMINOPHEN 325 MG PO TABS: 650.0000 mg | ORAL_TABLET | ORAL | Status: DC | PRN

## 2016-09-03 MED ORDER — PHENYLEPHRINE 40 MCG/ML (10ML) SYRINGE FOR IV PUSH (FOR BLOOD PRESSURE SUPPORT)
PREFILLED_SYRINGE | INTRAVENOUS | Status: AC
  Filled 2016-09-03: qty 20

## 2016-09-03 MED ORDER — LORATADINE 10 MG PO TABS
10.0000 mg | ORAL_TABLET | Freq: Every day | ORAL | Status: DC
  Administered 2016-09-04: 10 mg via ORAL
  Filled 2016-09-03: qty 1

## 2016-09-03 MED ORDER — OXYCODONE HCL 5 MG PO TABS
ORAL_TABLET | ORAL | Status: AC
  Filled 2016-09-03: qty 2

## 2016-09-03 MED ORDER — MEPERIDINE HCL 25 MG/ML IJ SOLN: 6.2500 mg | INTRAMUSCULAR | Status: DC | PRN

## 2016-09-03 SURGICAL SUPPLY — 52 items
BAG DECANTER FOR FLEXI CONT (MISCELLANEOUS) ×3 IMPLANT
BENZOIN TINCTURE PRP APPL 2/3 (GAUZE/BANDAGES/DRESSINGS) IMPLANT
BLADE CLIPPER SURG (BLADE) IMPLANT
BUR MATCHSTICK NEURO 3.0 LAGG (BURR) ×3 IMPLANT
CANISTER SUCT 3000ML PPV (MISCELLANEOUS) ×3 IMPLANT
CARTRIDGE OIL MAESTRO DRILL (MISCELLANEOUS) IMPLANT
CLOSURE WOUND 1/2 X4 (GAUZE/BANDAGES/DRESSINGS)
DECANTER SPIKE VIAL GLASS SM (MISCELLANEOUS) ×3 IMPLANT
DERMABOND ADVANCED (GAUZE/BANDAGES/DRESSINGS) ×2
DERMABOND ADVANCED .7 DNX12 (GAUZE/BANDAGES/DRESSINGS) ×1 IMPLANT
DIFFUSER DRILL AIR PNEUMATIC (MISCELLANEOUS) IMPLANT
DRAPE C-ARM 42X72 X-RAY (DRAPES) IMPLANT
DRAPE LAPAROTOMY 100X72 PEDS (DRAPES) IMPLANT
DRAPE LAPAROTOMY 100X72X124 (DRAPES) IMPLANT
DRAPE POUCH INSTRU U-SHP 10X18 (DRAPES) ×3 IMPLANT
DURAPREP 26ML APPLICATOR (WOUND CARE) ×3 IMPLANT
ELECT BLADE 4.0 EZ CLEAN MEGAD (MISCELLANEOUS) ×3
ELECT REM PT RETURN 9FT ADLT (ELECTROSURGICAL) ×3
ELECTRODE BLDE 4.0 EZ CLN MEGD (MISCELLANEOUS) ×1 IMPLANT
ELECTRODE REM PT RTRN 9FT ADLT (ELECTROSURGICAL) ×1 IMPLANT
GAUZE SPONGE 4X4 12PLY STRL (GAUZE/BANDAGES/DRESSINGS) ×3 IMPLANT
GAUZE SPONGE 4X4 16PLY XRAY LF (GAUZE/BANDAGES/DRESSINGS) IMPLANT
GLOVE BIOGEL PI IND STRL 8.5 (GLOVE) ×1 IMPLANT
GLOVE BIOGEL PI INDICATOR 8.5 (GLOVE) ×2
GLOVE ECLIPSE 8.5 STRL (GLOVE) ×3 IMPLANT
GOWN STRL REUS W/ TWL LRG LVL3 (GOWN DISPOSABLE) ×1 IMPLANT
GOWN STRL REUS W/ TWL XL LVL3 (GOWN DISPOSABLE) ×1 IMPLANT
GOWN STRL REUS W/TWL 2XL LVL3 (GOWN DISPOSABLE) ×3 IMPLANT
GOWN STRL REUS W/TWL LRG LVL3 (GOWN DISPOSABLE) ×2
GOWN STRL REUS W/TWL XL LVL3 (GOWN DISPOSABLE) ×2
KIT BASIN OR (CUSTOM PROCEDURE TRAY) ×3 IMPLANT
KIT ROOM TURNOVER OR (KITS) ×3 IMPLANT
MARKER SKIN DUAL TIP RULER LAB (MISCELLANEOUS) ×3 IMPLANT
NEEDLE HYPO 22GX1.5 SAFETY (NEEDLE) ×3 IMPLANT
NS IRRIG 1000ML POUR BTL (IV SOLUTION) ×3 IMPLANT
OIL CARTRIDGE MAESTRO DRILL (MISCELLANEOUS)
PACK LAMINECTOMY NEURO (CUSTOM PROCEDURE TRAY) ×3 IMPLANT
PAD ARMBOARD 7.5X6 YLW CONV (MISCELLANEOUS) ×3 IMPLANT
RASP 3.0MM (RASP) IMPLANT
SPONGE LAP 4X18 X RAY DECT (DISPOSABLE) IMPLANT
SPONGE SURGIFOAM ABS GEL SZ50 (HEMOSTASIS) ×3 IMPLANT
STAPLER SKIN PROX WIDE 3.9 (STAPLE) IMPLANT
STRIP CLOSURE SKIN 1/2X4 (GAUZE/BANDAGES/DRESSINGS) IMPLANT
SUT VIC AB 0 CT1 18XCR BRD8 (SUTURE) ×1 IMPLANT
SUT VIC AB 0 CT1 8-18 (SUTURE) ×2
SUT VIC AB 2-0 CP2 18 (SUTURE) ×3 IMPLANT
SUT VIC AB 3-0 SH 8-18 (SUTURE) ×6 IMPLANT
SWAB COLLECTION DEVICE MRSA (MISCELLANEOUS) IMPLANT
SWAB CULTURE ESWAB REG 1ML (MISCELLANEOUS) IMPLANT
TOWEL GREEN STERILE (TOWEL DISPOSABLE) ×2 IMPLANT
TOWEL GREEN STERILE FF (TOWEL DISPOSABLE) ×3 IMPLANT
WATER STERILE IRR 1000ML POUR (IV SOLUTION) ×3 IMPLANT

## 2016-09-03 NOTE — H&P (Signed)
Alicia Tyler is an 46 y.o. female.   Chief Complaint: Chronic back pain status post fusion L5-S1 HPI: Alicia Tyler is a 46 year old individual who had surgery a few years back with Dr. Joya Salm is a decompression and fusion at L5-S1. She had a myelogram because she was having chronic back pain with alternating radicular symptoms. 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 She is seen now because of this pain advised simple hardware removal to see if that one lessens her pain and to doesn't allow this process to go on to fuse.  Past Medical History:  Diagnosis Date  . Anxiety   . BMI (body mass index) 20.0-29.01 Apr 2010 172 LBS  . Chronic back pain   . Degenerative disc disease, thoracic   . Headache(784.0)   . Incontinence of feces    WEARS DEPENDS AT HS    . Neuropathy ([place])   . PONV (postoperative nausea and vomiting)   . Pseudoobstruction of colon 08/24/2014  . Seasonal allergies   . Serrated adenoma of colon DEC 2011    Past Surgical History:  Procedure Laterality Date  . ABLATION     endometrial ablation  . APPENDECTOMY    . BACK SURGERY  05/14/2011   Lumbar Fusion and cage  . BIOPSY  06/22/2015   Procedure: BIOPSY;  Surgeon: Danie Binder, MD;  Location: [facility];  Service: Endoscopy;;  gastric and esophageal bx's  . BREAST LUMPECTOMY     from the left breast 2003,  2016  . COLECTOMY N/A 08/24/2014   Procedure: TOTAL COLECTOMY;  Surgeon: Jamesetta So, MD;  Location: [facility] OR[facility];  Service: General;  Laterality: N/A;  . COLONOSCOPY  NOV 2011 SCREENING    HYPERPLASTIC RECTAL POLYP, SML IH, incomplete due to discomfort  . COLONOSCOPY  DEC 2011 w/ PROPOFOL   SERRATED ADENOMA, HYPERPLASTIC POLY[S[  . ESOPHAGEAL DILATION    . ESOPHAGOGASTRODUODENOSCOPY (EGD) WITH PROPOFOL N/A 06/22/2015   Procedure: ESOPHAGOGASTRODUODENOSCOPY (EGD) WITH PROPOFOL;  Surgeon: Danie Binder, MD;  Location: [facility];  Service: Endoscopy;   Laterality: N/A;  . FLEXIBLE SIGMOIDOSCOPY N/A 06/22/2015   Procedure: FLEXIBLE SIGMOIDOSCOPY WITH PROPOFOL;  Surgeon: Danie Binder, MD;  Location: [facility];  Service: Endoscopy;  Laterality: N/A;  0830   . KNEE SURGERY Bilateral    2 on righ-1 scope and one open; and one on left  . MOUTH SURGERY  07/21/2013  . OVARIAN CYST REMOVAL    . SMART PILL PROCEDURE N/A 07/04/2014   Procedure: SMART PILL PROCEDURE;  Surgeon: Danie Binder, MD;  Location: [facility];  Service: Endoscopy;  Laterality: N/A;  800  . TUBAL LIGATION      Family History  Problem Relation Age of Onset  . Breast cancer Maternal Aunt   . Colon cancer Neg Hx   . Colon polyps Neg Hx    Social History:  reports that she has been smoking Cigarettes.  She has a 36.00 pack-year smoking history. She uses smokeless tobacco. She reports that she drinks alcohol. She reports that she does not use drugs.  Allergies:  Allergies  Allergen Reactions  . Aspirin Shortness Of Breath, Swelling and Rash  . Bee Venom Anaphylaxis       . Beesix [Pyridoxine Hcl] Anaphylaxis  . Codeine Nausea And Vomiting and Rash  . Penicillins Other (See Comments)    Makes hair fall out   Has patient had a PCN  reaction causing immediate rash, facial/tongue/throat swelling, SOB or lightheadedness with hypotension: unknown Has patient had a PCN reaction causing severe rash involving mucus membranes or skin necrosis: unknown Has patient had a PCN reaction that required hospitalization unknown Has patient had a PCN reaction occurring within the last 10 years: No If all of the above answers are "NO", then may proceed with Cephalosporin use.      Medications Prior to Admission  Medication Sig Dispense Refill  . diazepam (VALIUM) 5 MG tablet Take 5 mg by mouth every 6 (six) hours as needed for muscle spasms.    . fluticasone (FLONASE) 50 MCG/ACT nasal spray Place 1 spray into both nostrils 2 (two) times daily.    . furosemide (LASIX) 20 MG  tablet Take 20 mg by mouth 2 (two) times a week. Mondays and Fridays    . gabapentin (NEURONTIN) 300 MG capsule Take 300 mg by mouth 3 (three) times daily.    Marland Kitchen LINZESS 290 MCG CAPS capsule TAKE 1 CAPSULE BY MOUTH EVERY DAY 30 MINUTES PRIOR TO BREAKFAST 30 capsule 11  . loratadine (CLARITIN) 10 MG tablet Take 10 mg by mouth daily.      . meloxicam (MOBIC) 15 MG tablet Take 15 mg by mouth daily.    . Olopatadine HCl (PAZEO) 0.7 % SOLN Apply 1 drop to eye daily.    Marland Kitchen omeprazole (PRILOSEC) 20 MG capsule Take 1 capsule (20 mg total) by mouth daily. 60 capsule 1  . Oxycodone HCl 10 MG TABS Take 10 mg by mouth 3 (three) times daily.     . SUMAtriptan (IMITREX) 100 MG tablet Take 100 mg by mouth every 2 (two) hours as needed for migraine or headache. May repeat in 2 hours if headache persists or recurs.    . topiramate (TOPAMAX) 50 MG tablet Take 50 mg by mouth 2 (two) times daily.     . Wheat Dextrin (BENEFIBER DRINK MIX PO) Take 15 mLs by mouth 2 (two) times daily. Mixed with water. Healthy Weight    . ALPRAZolam (XANAX) 0.5 MG tablet Take 1 tablet (0.5 mg total) by mouth daily. (Patient taking differently: Take 0.5 mg by mouth daily as needed for anxiety. ) 50 tablet 0  . Cholecalciferol (VITAMIN D) 2000 UNITS tablet Take 2,000 Units by mouth daily.      . diazepam (VALIUM) 10 MG tablet Take 1 tablet (10 mg total) by mouth every 8 (eight) hours as needed (muscle spasms). (Patient not taking: Reported on 08/22/2016) 50 tablet 0    No results found for this or any previous visit (from the past 48 hour(s)). No results found.  Review of Systems  Constitutional: Negative.   HENT: Negative.   Eyes: Negative.   Respiratory: Negative.   Cardiovascular: Negative.   Musculoskeletal: Positive for back pain.  Skin: Negative.   Neurological: Negative.   Psychiatric/Behavioral: Negative.     Blood pressure 111/70, pulse 80, temperature 98.7 F (37.1 C), temperature source Oral, resp. rate 18, height 5'  3" (1.6 m), weight 73.9 kg (163 lb), last menstrual period 07/22/2011, SpO2 99 %. Physical Exam  Constitutional: She is oriented to person, place, and time. She appears well-developed and well-nourished.  HENT:  Head: Normocephalic and atraumatic.  Eyes: EOM are normal. Pupils are equal, round, and reactive to light.  Neck: Normal range of motion.  Cardiovascular: Normal rate and regular rhythm.   Respiratory: Effort normal and breath sounds normal.  GI: Soft. Bowel sounds are normal.  Neurological: She is  alert and oriented to person, place, and time. She has normal reflexes.  Station and gait are normal  Skin: Skin is warm and dry.  Psychiatric: She has a normal mood and affect. Her behavior is normal. Judgment and thought content normal.     Assessment/Plan Arthrodesis L5-S1 with chronic back pain  Plan: Hardware removal using a Metrix technique  Earleen Newport, MD 09/03/2016, 2:53 PM

## 2016-09-03 NOTE — Op Note (Signed)
Date of surgery: 09/03/2016 Preoperative diagnosis: Pseudoarthrosis L5-S1 with broken hardware in sacrum Postoperative diagnosis: Pseudoarthrosis L5-S1 with broken hardware in the sacrum Procedure: Removal of hardware from L5-S1 fusion using a Metrix retractor system and fluoroscopic visualization Surgeon: Kristeen Miss Anesthesia: Gen. endotracheal Indications: Alicia Tyler is a 46 year old individual had a fusion done at L5-S1 some years ago she has a pseudoarthrosis at the L5-S1 level however she fractured the screws in the sacrum and there is likely some motion across the L5-S1 segment the hardware is being removed at this time.  Procedure: The patient was brought to the operating supine on a stretcher. After the smooth induction of general endotracheal anesthesia, she was carefully turned prone. The bony prominences were appropriately padded and protected. The back was prepped without wall DuraPrep and draped in a sterile fashion. Fluoroscopic guidance was used to localize the hardware and the lines for the incision were marked on the left than the right. There was infiltrated with 1% lidocaine with epinephrine and then a small vertical incision was made for starting a month right side. A K wire was passed down to the hardware which was visualized fluoroscopically and then a series of dilators was passed over this with a 22 mm to being plastered over the hardware itself. A 3 cm deep cannula was used on the right side and through this aperture the surface of the hardware had his fascial covering opened with monopolar cautery. Center of the screw Was identified and then loosened using appropriate tools. The other screw Was then removed and then the rod could be uncovered and it was removed then the individual screws were removed. The broken S1 screw on the right side then was in vision deep in its opening. A high-speed drill and suction were used to open a larger opening around this opening and through  this opening a trephine was passed. The first or 5 was 5 mm in width slightly small and this was enlarged with a 6 mm trestle. This covered the opening over the screw. Then a stud extract her was used to extract the stud. The similar procedure was completed on the opposite side also and the hardware was removed with relative ease. The incisions were copiously irrigated and then the fascia was closed with 3-0 Vicryl interrupted fashion and 3-0 Vicryl was used to close subcutaneous take her skin. Dermabond was used on the skin area blood loss is estimated less than 20 mL.

## 2016-09-03 NOTE — Progress Notes (Signed)
Patient ID: Alicia Tyler, female   DOB: 1971/03/13, 46 y.o.   MRN: [id number] Vital signs are stable Motor function is intact Patient may be discharged if alert and awake

## 2016-09-03 NOTE — Transfer of Care (Signed)
Immediate Anesthesia Transfer of Care Note  Patient: Alicia Tyler  Procedure(s) Performed: Procedure(s): Removal of Lumbar five-Sacrum one  hardware with Metrex (N/A)  Patient Location: PACU  Anesthesia Type:General  Level of Consciousness: awake, alert , oriented and patient cooperative  Airway & Oxygen Therapy: Patient Spontanous Breathing and Patient connected to nasal cannula oxygen  Post-op Assessment: Report given to RN and Post -op Vital signs reviewed and stable  Post vital signs: Reviewed and stable  Last Vitals:  Vitals:   09/03/16 1346  BP: 111/70  Pulse: 80  Resp: 18  Temp: 37.1 C    Last Pain:  Vitals:   09/03/16 1346  TempSrc: Oral      Patients Stated Pain Goal: 3 (06/25/70 5366)  Complications: No apparent anesthesia complications

## 2016-09-03 NOTE — Discharge Summary (Addendum)
Physician Discharge Summary  Patient ID: Alicia Tyler MRN: [id number] DOB/AGE: 47-May-1972 47 y.o.  Admit date: 09/03/2016 Discharge date: 09/04/2016  Admission Diagnoses:Pseudoarthrosis L5-S1 with fractured hardware  Discharge Diagnoses: Pseudoarthrosis L5-S1 with fractured hardware Active Problems:   Lumbar pseudoarthrosis   Discharged Condition: good  Hospital Course: Patient was admitted to undergo surgical removal of hardware which she tolerated well  Consults: None  Significant Diagnostic Studies: None  Treatments: surgery: Surgical removal of L5-S1 pedicle screws and rods using a Metrix technique  Discharge Exam: Blood pressure 116/69, pulse 89, temperature 97 F (36.1 C), resp. rate 15, height 5\' 3"  (1.6 m), weight 73.9 kg (163 lb), last menstrual period 07/22/2011, SpO2 99 %. Incision is clean and dry station and gait are intact  Disposition: 01-Home or Self Care  Discharge Instructions    Call MD for:  redness, tenderness, or signs of infection (pain, swelling, redness, odor or green/yellow discharge around incision site)    Complete by:  As directed    Call MD for:  severe uncontrolled pain    Complete by:  As directed    Call MD for:  temperature >100.4    Complete by:  As directed    Diet - low sodium heart healthy    Complete by:  As directed    Incentive spirometry RT    Complete by:  As directed    Increase activity slowly    Complete by:  As directed      Allergies as of 09/03/2016      Reactions   Aspirin Shortness Of Breath, Swelling, Rash   Bee Venom Anaphylaxis       Beesix [pyridoxine Hcl] Anaphylaxis   Codeine Nausea And Vomiting, Rash   Penicillins Other (See Comments)   Makes hair fall out  Has patient had a PCN reaction causing immediate rash, facial/tongue/throat swelling, SOB or lightheadedness with hypotension: unknown Has patient had a PCN reaction causing severe rash involving mucus membranes or skin necrosis: unknown Has patient  had a PCN reaction that required hospitalization unknown Has patient had a PCN reaction occurring within the last 10 years: No If all of the above answers are "NO", then may proceed with Cephalosporin use.      Medication List    TAKE these medications   ALPRAZolam 0.5 MG tablet Commonly known as:  XANAX Take 1 tablet (0.5 mg total) by mouth daily. What changed:  when to take this  reasons to take this   BENEFIBER DRINK MIX PO Take 15 mLs by mouth 2 (two) times daily. Mixed with water. Healthy Weight   diazepam 5 MG tablet Commonly known as:  VALIUM Take 5 mg by mouth every 6 (six) hours as needed for muscle spasms.   diazepam 10 MG tablet Commonly known as:  VALIUM Take 1 tablet (10 mg total) by mouth every 8 (eight) hours as needed (muscle spasms).   fluticasone 50 MCG/ACT nasal spray Commonly known as:  FLONASE Place 1 spray into both nostrils 2 (two) times daily.   furosemide 20 MG tablet Commonly known as:  LASIX Take 20 mg by mouth 2 (two) times a week. Mondays and Fridays   gabapentin 300 MG capsule Commonly known as:  NEURONTIN Take 300 mg by mouth 3 (three) times daily.   LINZESS 290 MCG Caps capsule Generic drug:  linaclotide TAKE 1 CAPSULE BY MOUTH EVERY DAY 30 MINUTES PRIOR TO BREAKFAST   loratadine 10 MG tablet Commonly known as:  CLARITIN Take 10 mg by mouth  daily.   meloxicam 15 MG tablet Commonly known as:  MOBIC Take 15 mg by mouth daily.   omeprazole 20 MG capsule Commonly known as:  PRILOSEC Take 1 capsule (20 mg total) by mouth daily.   Oxycodone HCl 10 MG Tabs Take 10 mg by mouth 3 (three) times daily. What changed:  Another medication with the same name was added. Make sure you understand how and when to take each.   oxyCODONE 5 MG immediate release tablet Commonly known as:  Oxy IR/ROXICODONE Take 1-2 tablets (5-10 mg total) by mouth every 3 (three) hours as needed for severe pain. What changed:  You were already taking a  medication with the same name, and this prescription was added. Make sure you understand how and when to take each.   PAZEO 0.7 % Soln Generic drug:  Olopatadine HCl Apply 1 drop to eye daily.   SUMAtriptan 100 MG tablet Commonly known as:  IMITREX Take 100 mg by mouth every 2 (two) hours as needed for migraine or headache. May repeat in 2 hours if headache persists or recurs.   topiramate 50 MG tablet Commonly known as:  TOPAMAX Take 50 mg by mouth 2 (two) times daily.   Vitamin D 2000 units tablet Take 2,000 Units by mouth daily.        SignedEarleen Newport 09/03/2016, 6:17 PM

## 2016-09-03 NOTE — Anesthesia Procedure Notes (Signed)
Procedure Name: Intubation Date/Time: 09/03/2016 3:07 PM Performed by: Shirlyn Goltz Pre-anesthesia Checklist: Patient identified, Emergency Drugs available, Suction available and Patient being monitored Patient Re-evaluated:Patient Re-evaluated prior to inductionOxygen Delivery Method: Circle system utilized Preoxygenation: Pre-oxygenation with 100% oxygen Intubation Type: IV induction, Rapid sequence and Cricoid Pressure applied Grade View: Grade I Tube type: Oral Tube size: 7.0 mm Number of attempts: 1 Airway Equipment and Method: Stylet Placement Confirmation: ETT inserted through vocal cords under direct vision,  positive ETCO2 and breath sounds checked- equal and bilateral Secured at: 21 cm Tube secured with: Tape Dental Injury: Teeth and Oropharynx as per pre-operative assessment

## 2016-09-03 NOTE — Anesthesia Preprocedure Evaluation (Addendum)
Anesthesia Evaluation    History of Anesthesia Complications (+) PONV and history of anesthetic complications  Airway Mallampati: II  TM Distance: >3 FB     Dental   Pulmonary Current Smoker,    breath sounds clear to auscultation       Cardiovascular  Rhythm:Regular Rate:Normal     Neuro/Psych  Headaches, PSYCHIATRIC DISORDERS Anxiety    GI/Hepatic GERD  ,  Endo/Other    Renal/GU Renal disease     Musculoskeletal  (+) Arthritis ,   Abdominal   Peds  Hematology   Anesthesia Other Findings   Reproductive/Obstetrics                            Anesthesia Physical Anesthesia Plan  ASA: II  Anesthesia Plan: General   Post-op Pain Management:    Induction: Intravenous and Rapid sequence  Airway Management Planned: Oral ETT  Additional Equipment:   Intra-op Plan:   Post-operative Plan: Extubation in OR  Informed Consent: I have reviewed the patients History and Physical, chart, labs and discussed the procedure including the risks, benefits and alternatives for the proposed anesthesia with the patient or authorized representative who has indicated his/her understanding and acceptance.   Dental advisory given  Plan Discussed with: CRNA  Anesthesia Plan Comments: (Patient reports having loose stools on a regular basis and wishes colostomy to be well covered during procedure. On Narcotics for chronic pain)     Anesthesia Quick Evaluation

## 2016-09-04 ENCOUNTER — Encounter (HOSPITAL_COMMUNITY): Payer: Self-pay | Admitting: Neurological Surgery

## 2016-09-04 DIAGNOSIS — M960 Pseudarthrosis after fusion or arthrodesis: Secondary | ICD-10-CM | POA: Diagnosis not present

## 2016-09-04 MED ORDER — CHLORHEXIDINE GLUCONATE CLOTH 2 % EX PADS
6.0000 | MEDICATED_PAD | Freq: Every day | CUTANEOUS | Status: DC
  Administered 2016-09-04: 6 via TOPICAL

## 2016-09-04 NOTE — Care Management (Signed)
Patient does not have a qualifying diagnosis to receive Home Health services through [facility]. Patient states that she can get a aide through [facility] if CM would contact them. CM called [place] services and was informed that they would need a form completed ASAP by the attending doctor. Case manager received form and faxed it to [facility] at Dr. Clarice Pole -[phone]. Requested that form be completed today as requested by the [facility] department and return to them via fax: [phone], expedited fax line.   Ricki Miller, RN BSN Case Manager

## 2016-09-04 NOTE — Anesthesia Postprocedure Evaluation (Signed)
Anesthesia Post Note  Patient: [name]  Procedure(s) Performed: Procedure(s) (LRB): Removal of Lumbar five-Sacrum one  hardware with Metrex (N/A)  Patient location during evaluation: PACU Anesthesia Type: General Level of consciousness: awake and alert Pain management: pain level controlled Vital Signs Assessment: post-procedure vital signs reviewed and stable Respiratory status: spontaneous breathing, nonlabored ventilation, respiratory function stable and patient connected to nasal cannula oxygen Cardiovascular status: blood pressure returned to baseline and stable Postop Assessment: no signs of nausea or vomiting Anesthetic complications: no       Last Vitals:  Vitals:   09/04/16 0752 09/04/16 1223  BP: (!) 102/43 (!) 96/59  Pulse: 92 85  Resp: 18 18  Temp: 37.5 C 36.7 C    Last Pain:  Vitals:   09/04/16 1223  TempSrc: Oral  PainSc:                  Catalina Gravel

## 2016-09-04 NOTE — Progress Notes (Signed)
Physical Therapy Treatment Patient Details Name: Alicia Tyler MRN: [id number] DOB: 14-Mar-1971 Today's Date: 09/04/2016    History of Present Illness 46 yo female s/p Removal of hardware from L5-S1 fusion using a Metrix retractor system and fluoroscopic visualization    PT Comments    Focus of session was stair training as pt to d/c home today. Increased cues provided for more natural gait pattern/gait speed, and up to a heavy min assist provided for support as pt negotiated stairs. Will continue to follow and progress as able per POC.    Follow Up Recommendations  Outpatient PT;Supervision for mobility/OOB     Equipment Recommendations  Rolling walker with 5" wheels;3in1 (PT)    Recommendations for Other Services       Precautions / Restrictions Precautions Precautions: Fall;Back Precaution Comments: Reviewed precautions verbally Required Braces or Orthoses: Spinal Brace Spinal Brace: Lumbar corset;Applied in standing position Restrictions Weight Bearing Restrictions: No    Mobility  Bed Mobility Overal bed mobility: Needs Assistance Bed Mobility: Rolling;Sidelying to Sit Rolling: Supervision Sidelying to sit: Supervision       General bed mobility comments: VC's for proper log roll technique  Transfers Overall transfer level: Needs assistance Equipment used: Rolling walker (2 wheeled) Transfers: Sit to/from Stand Sit to Stand: Supervision         General transfer comment: Increased time. VC's provided for safety as pt reaching unsafely for external support to stand.   Ambulation/Gait Ambulation/Gait assistance: Min guard Ambulation Distance (Feet): 75 Feet Assistive device: Rolling walker (2 wheeled) Gait Pattern/deviations: Step-through pattern;Decreased stride length;Shuffle;Drifts right/left;Narrow base of support;Trunk flexed Gait velocity: Decreased Gait velocity interpretation: Below normal speed for age/gender General Gait Details: Very slow and  guarded, however was able to improve gait speed with cues (unable to maintain).   Stairs Stairs: Yes   Stair Management: One rail Right;No rails;Forwards;Step to pattern Number of Stairs: 6 General stair comments: VC's for sequencing and safety. Pt required HHA but no real physical assistance to ascend stairs, and heavy min assist to descend stairs. Pt was edcucated on general safety and how family can assist at home.   Wheelchair Mobility    Modified Rankin (Stroke Patients Only)       Balance Overall balance assessment: Needs assistance Sitting-balance support: Feet supported;No upper extremity supported Sitting balance-Leahy Scale: Fair     Standing balance support: No upper extremity supported;During functional activity Standing balance-Leahy Scale: Fair Standing balance comment: Statically                    Cognition Arousal/Alertness: Awake/alert Behavior During Therapy: WFL for tasks assessed/performed Overall Cognitive Status: Within Functional Limits for tasks assessed                      Exercises      General Comments        Pertinent Vitals/Pain Pain Assessment: Faces Faces Pain Scale: Hurts little more Pain Location: back Pain Descriptors / Indicators: Sore;Operative site guarding Pain Intervention(s): Limited activity within patient's tolerance;Monitored during session;Repositioned    Home Living Family/patient expects to be discharged to:: Private residence Living Arrangements: Spouse/significant other Available Help at Discharge: Family Type of Home: House Home Access: Stairs to enter Entrance Stairs-Rails: None Home Layout: One level Home Equipment: Environmental consultant - 2 wheels;Hand held shower head;Adaptive equipment      Prior Function Level of Independence: Independent          PT Goals (current goals can now be found in  the care plan section) Acute Rehab PT Goals PT Goal Formulation: With patient Time For Goal Achievement:  09/11/16 Potential to Achieve Goals: Good Progress towards PT goals: Progressing toward goals    Frequency    Min 5X/week      PT Plan Current plan remains appropriate    Co-evaluation             End of Session Equipment Utilized During Treatment: Gait belt;Back brace Activity Tolerance: Patient limited by pain;Patient limited by fatigue Patient left: in chair;with call bell/phone within reach Nurse Communication: Mobility status PT Visit Diagnosis: Other abnormalities of gait and mobility (R26.89);Pain     Time: 6073-7106 PT Time Calculation (min) (ACUTE ONLY): 24 min  Charges:  $Gait Training: 23-37 mins                    G Codes:     Thelma Comp 10/04/2016, 1:18 PM  Rolinda Roan, PT, DPT Acute Rehabilitation Services Pager: [phone]

## 2016-09-04 NOTE — Evaluation (Signed)
Physical Therapy Evaluation Patient Details Name: Alicia Tyler MRN: [id number] DOB: 29-Nov-1970 Today's Date: 09/04/2016   History of Present Illness  46 yo female s/p Removal of hardware from L5-S1 fusion using a Metrix retractor system and fluoroscopic visualization.  Clinical Impression  Pt admitted with above diagnosis. Pt currently with functional limitations due to the deficits listed below (see PT Problem List). At the time of PT eval pt was able to perform transfers and ambulation with gross min guard for safety. Pt with complaints of pain throughout session and states she cannot complete stair training due to fatigue and feeling that LE's were going to give out on her. Pt will benefit from skilled PT to increase their independence and safety with mobility to allow discharge to the venue listed below.       Follow Up Recommendations Outpatient PT;Supervision for mobility/OOB    Equipment Recommendations  Rolling walker with 5" wheels;3in1 (PT)    Recommendations for Other Services       Precautions / Restrictions Precautions Precautions: Back Precaution Comments: handout provided and reviewed in detail Required Braces or Orthoses: Spinal Brace Spinal Brace: Lumbar corset;Applied in standing position Restrictions Weight Bearing Restrictions: No      Mobility  Bed Mobility               General bed mobility comments: Pt up in the chair upon PT arrival   Transfers Overall transfer level: Needs assistance   Transfers: Sit to/from Stand Sit to Stand: Min guard         General transfer comment: incr time due to surgerical discomfort. Pt reaching unsafely for external support and required min guard to achieve full stand.   Ambulation/Gait Ambulation/Gait assistance: Min guard Ambulation Distance (Feet): 75 Feet Assistive device: Rolling walker (2 wheeled) Gait Pattern/deviations: Step-through pattern;Decreased stride length;Shuffle;Drifts right/left;Narrow base  of support;Trunk flexed Gait velocity: Decreased Gait velocity interpretation: Below normal speed for age/gender General Gait Details: Very slow and guarded gait. Pt initially gripping the walker extensively with increased tension in her shoulders. When cued to loosen her grip on the walker she unsafely let go and required assistance to steady.   Stairs            Wheelchair Mobility    Modified Rankin (Stroke Patients Only)       Balance Overall balance assessment: Needs assistance Sitting-balance support: Feet supported;No upper extremity supported Sitting balance-Leahy Scale: Fair     Standing balance support: No upper extremity supported;During functional activity Standing balance-Leahy Scale: Fair Standing balance comment: Statically                             Pertinent Vitals/Pain Pain Assessment: Faces Faces Pain Scale: Hurts little more Pain Location: back Pain Descriptors / Indicators: Sore;Operative site guarding Pain Intervention(s): Monitored during session;Limited activity within patient's tolerance;Repositioned    Home Living Family/patient expects to be discharged to:: Private residence Living Arrangements: Spouse/significant other Available Help at Discharge: Family Type of Home: House Home Access: Stairs to enter Entrance Stairs-Rails: None Entrance Stairs-Number of Steps: 6 Home Layout: One level Home Equipment: Walker - 2 wheels;Hand held shower head;Adaptive equipment      Prior Function Level of Independence: Independent               Hand Dominance   Dominant Hand: Right    Extremity/Trunk Assessment   Upper Extremity Assessment Upper Extremity Assessment: Overall WFL for tasks assessed  Lower Extremity Assessment Lower Extremity Assessment: Generalized weakness    Cervical / Trunk Assessment Cervical / Trunk Assessment: Other exceptions (s/p surg)  Communication   Communication: No difficulties   Cognition Arousal/Alertness: Awake/alert Behavior During Therapy: WFL for tasks assessed/performed Overall Cognitive Status: Within Functional Limits for tasks assessed                      General Comments      Exercises     Assessment/Plan    PT Assessment Patient needs continued PT services  PT Problem List Decreased range of motion;Decreased strength;Decreased activity tolerance;Decreased balance;Decreased mobility;Decreased knowledge of use of DME;Decreased safety awareness;Decreased knowledge of precautions;Pain       PT Treatment Interventions DME instruction;Gait training;Stair training;Functional mobility training;Therapeutic activities;Therapeutic exercise;Neuromuscular re-education;Patient/family education    PT Goals (Current goals can be found in the Care Plan section)  Acute Rehab PT Goals Patient Stated Goal: Home tomorrow PT Goal Formulation: With patient Time For Goal Achievement: 09/11/16 Potential to Achieve Goals: Good    Frequency Min 5X/week   Barriers to discharge        Co-evaluation               End of Session Equipment Utilized During Treatment: Gait belt;Back brace Activity Tolerance: Patient limited by pain;Patient limited by fatigue Patient left: in chair;with call bell/phone within reach Nurse Communication: Mobility status PT Visit Diagnosis: Other abnormalities of gait and mobility (R26.89);Pain    Functional Assessment Tool Used: AM-PAC 6 Clicks Basic Mobility Functional Limitation: Mobility: Walking and moving around Mobility: Walking and Moving Around Current Status [phone]): At least 40 percent but less than 60 percent impaired, limited or restricted Mobility: Walking and Moving Around Goal Status [phone]): At least 20 percent but less than 40 percent impaired, limited or restricted    Time: 0827-0851 PT Time Calculation (min) (ACUTE ONLY): 24 min   Charges:   PT Evaluation $PT Eval Moderate Complexity: 1  Procedure PT Treatments $Gait Training: 8-22 mins   PT G Codes:   PT G-Codes **NOT FOR INPATIENT CLASS** Functional Assessment Tool Used: AM-PAC 6 Clicks Basic Mobility Functional Limitation: Mobility: Walking and moving around Mobility: Walking and Moving Around Current Status (D6644): At least 40 percent but less than 60 percent impaired, limited or restricted Mobility: Walking and Moving Around Goal Status [phone]): At least 20 percent but less than 40 percent impaired, limited or restricted     Thelma Comp 09/04/2016, 1:06 PM   Rolinda Roan, PT, DPT Acute Rehabilitation Services Pager: [phone]

## 2016-09-04 NOTE — Evaluation (Signed)
Occupational Therapy Evaluation Patient Details Name: Alicia Tyler MRN: [id number] DOB: 1971-01-14 Today's Date: 09/04/2016    History of Present Illness 46 yo female s/p Removal of hardware from L5-S1 fusion using a Metrix retractor system and fluoroscopic visualization  Past Medical History:  Diagnosis Date  . Anxiety   . BMI (body mass index) 20.0-29.01 Apr 2010 172 LBS  . Chronic back pain   . Degenerative disc disease, thoracic   . Headache(784.0)   . Incontinence of feces    WEARS DEPENDS AT HS    . Neuropathy ([place])   . PONV (postoperative nausea and vomiting)   . Pseudoobstruction of colon 08/24/2014  . Seasonal allergies   . Serrated adenoma of colon DEC 2011      Clinical Impression   Patient evaluated by Occupational Therapy with no further acute OT needs identified. All education has been completed and the patient has no further questions. See below for any follow-up Occupational Therapy or equipment needs. OT to sign off. Thank you for referral.      Follow Up Recommendations  No OT follow up    Equipment Recommendations  None recommended by OT    Recommendations for Other Services       Precautions / Restrictions Precautions Precautions: Back Precaution Comments: handout provided and reviewd in detail for adls. Required Braces or Orthoses: Spinal Brace Spinal Brace: Lumbar corset;Applied in standing position      Mobility Bed Mobility Overal bed mobility: Independent                Transfers Overall transfer level: Modified independent               General transfer comment: incr time due to surgerical discomfort    Balance                                            ADL Overall ADL's : Needs assistance/impaired Eating/Feeding: Independent   Grooming: Wash/dry hands;Supervision/safety   Upper Body Bathing: Supervision/ safety   Lower Body Bathing: Control and instrumentation engineer Transfer:  Supervision/safety   Toileting- Clothing Manipulation and Hygiene: Supervision/safety Toileting - Clothing Manipulation Details (indicate cue type and reason): incr time     Functional mobility during ADLs: Supervision/safety   Back handout provided and reviewed adls in detail. Pt educated on: clothing between brace, never sleep in brace, set an alarm at night for medication, avoid sitting for long periods of time, correct bed positioning for sleeping, correct sequence for bed mobility, avoiding lifting more than 5 pounds and never wash directly over incision. All education is complete and patient indicates understanding.       Vision   Vision Assessment?: No apparent visual deficits     Perception     Praxis      Pertinent Vitals/Pain Pain Assessment: Faces Faces Pain Scale: Hurts little more Pain Location: back Pain Descriptors / Indicators: Sore;Operative site guarding Pain Intervention(s): Monitored during session;Premedicated before session;Repositioned     Hand Dominance Right   Extremity/Trunk Assessment Upper Extremity Assessment Upper Extremity Assessment: Overall WFL for tasks assessed   Lower Extremity Assessment Lower Extremity Assessment: Defer to PT evaluation   Cervical / Trunk Assessment Cervical / Trunk Assessment: Other exceptions (s/p surg)   Communication Communication Communication: No difficulties   Cognition Arousal/Alertness: Awake/alert Behavior During Therapy: [facility]  for tasks assessed/performed Overall Cognitive Status: Within Functional Limits for tasks assessed                     General Comments       Exercises       Shoulder Instructions      Home Living Family/patient expects to be discharged to:: Private residence Living Arrangements: Spouse/significant other Available Help at Discharge: Family Type of Home: House Home Access: Stairs to enter Technical brewer of Steps: 6 Entrance Stairs-Rails: None Home  Layout: One level     Bathroom Shower/Tub: Teacher, early years/pre: [place]: Environmental consultant - 2 wheels;Hand held shower head;Adaptive equipment Adaptive Equipment: Reacher;Sock aid;Long-handled shoe horn;Long-handled sponge        Prior Functioning/Environment Level of Independence: Independent                 OT Problem List:        OT Treatment/Interventions:      OT Goals(Current goals can be found in the care plan section) Acute Rehab OT Goals OT Goal Formulation: With patient  OT Frequency:     Barriers to D/C:            Co-evaluation              End of Session Equipment Utilized During Treatment: Rolling walker;Back brace Nurse Communication: Mobility status;Precautions  Activity Tolerance: Patient tolerated treatment well Patient left: in chair;with call bell/phone within reach  OT Visit Diagnosis: Unsteadiness on feet (R26.81)                ADL either performed or assessed with clinical judgement  Time: 0750-0814 OT Time Calculation (min): 24 min Charges:  OT General Charges $OT Visit: 1 Procedure OT Evaluation $OT Eval Moderate Complexity: 1 Procedure G-Codes: OT G-codes **NOT FOR INPATIENT CLASS** Functional Assessment Tool Used: AM-PAC 6 Clicks Daily Activity Functional Limitation: Self care Self Care Current Status (W4097): 0 percent impaired, limited or restricted Self Care Goal Status (D5329): 0 percent impaired, limited or restricted Self Care Discharge Status (J2426): 0 percent impaired, limited or restricted    Jeri Modena   OTR/L Pager: [phone] Office: [phone] .   Parke Poisson B 09/04/2016, 8:43 AM

## 2016-09-04 NOTE — Progress Notes (Signed)
Pt and family given D/C instructions with Rx, verbal understanding was provided. Pt's incision is clean and dry with no sign of infection. Pt's IV was removed prior to D/C. Pt received RW and 3-n-1 from [facility] [facility] prior to D/C. Pt D/C'd home via wheelchair @ 1345. Pt is stable @ D/C and has no other needs at this time. Holli Humbles, RN

## 2016-09-12 ENCOUNTER — Other Ambulatory Visit: Payer: Self-pay | Admitting: Gastroenterology

## 2016-10-24 ENCOUNTER — Encounter: Payer: Self-pay | Admitting: Gastroenterology

## 2016-10-28 ENCOUNTER — Encounter (HOSPITAL_COMMUNITY): Payer: Self-pay | Admitting: Cardiology

## 2016-10-28 ENCOUNTER — Emergency Department (HOSPITAL_COMMUNITY)
Admission: EM | Admit: 2016-10-28 | Discharge: 2016-10-28 | Disposition: A | Discharge status: Home / Self Care | Payer: Medicaid Other | Attending: Emergency Medicine | Admitting: Emergency Medicine

## 2016-10-28 DIAGNOSIS — F17210 Nicotine dependence, cigarettes, uncomplicated: Secondary | ICD-10-CM | POA: Diagnosis not present

## 2016-10-28 DIAGNOSIS — N3090 Cystitis, unspecified without hematuria: Secondary | ICD-10-CM | POA: Diagnosis not present

## 2016-10-28 DIAGNOSIS — F17290 Nicotine dependence, other tobacco product, uncomplicated: Secondary | ICD-10-CM | POA: Insufficient documentation

## 2016-10-28 DIAGNOSIS — Z79899 Other long term (current) drug therapy: Secondary | ICD-10-CM | POA: Insufficient documentation

## 2016-10-28 DIAGNOSIS — R300 Dysuria: Secondary | ICD-10-CM | POA: Diagnosis present

## 2016-10-28 LAB — URINALYSIS, ROUTINE W REFLEX MICROSCOPIC
Bilirubin Urine: NEGATIVE
Glucose, UA: NEGATIVE mg/dL
KETONES UR: NEGATIVE mg/dL
Nitrite: POSITIVE — AB
PROTEIN: 30 mg/dL — AB
Specific Gravity, Urine: 1.006 (ref 1.005–1.030)
pH: 5 (ref 5.0–8.0)

## 2016-10-28 MED ORDER — CIPROFLOXACIN HCL 500 MG PO TABS: 500.0000 mg | ORAL_TABLET | Freq: Two times a day (BID) | ORAL | 0 refills | Status: DC

## 2016-10-28 MED ORDER — ONDANSETRON 4 MG PO TBDP: ORAL_TABLET | ORAL | 0 refills | Status: DC

## 2016-10-28 MED ORDER — CIPROFLOXACIN HCL 250 MG PO TABS
500.0000 mg | ORAL_TABLET | Freq: Once | ORAL | Status: AC
  Administered 2016-10-28: 500 mg via ORAL
  Filled 2016-10-28: qty 2

## 2016-10-28 MED ORDER — ONDANSETRON 4 MG PO TBDP
4.0000 mg | ORAL_TABLET | Freq: Once | ORAL | Status: AC
  Administered 2016-10-28: 4 mg via ORAL
  Filled 2016-10-28: qty 1

## 2016-10-28 NOTE — ED Notes (Addendum)
Lower abdominal pain  with urination since yesterday.   Noticed blood with wiping .  Vomiting times 2.  Recent back surgery

## 2016-10-28 NOTE — ED Provider Notes (Signed)
[facility] DEPT Provider Note   CSN: [id number] Arrival date & time: 10/28/16  1637     History   Chief Complaint Chief Complaint  Patient presents with  . Dysuria    HPI Alicia Tyler is a 46 y.o. female.  Patient complains of dysuria that started yesterday   The history is provided by the patient.  Dysuria   This is a recurrent problem. The current episode started 12 to 24 hours ago. The problem occurs every urination. The problem has not changed since onset.The quality of the pain is described as burning. The pain is at a severity of 5/10. There has been no fever. Pertinent negatives include no frequency and no hematuria.    Past Medical History:  Diagnosis Date  . Anxiety   . BMI (body mass index) 20.0-29.01 Apr 2010 172 LBS  . Chronic back pain   . Degenerative disc disease, thoracic   . Headache(784.0)   . Incontinence of feces    WEARS DEPENDS AT HS    . Neuropathy   . PONV (postoperative nausea and vomiting)   . Pseudoobstruction of colon 08/24/2014  . Seasonal allergies   . Serrated adenoma of colon DEC 2011    Patient Active Problem List   Diagnosis Date Noted  . Lumbar pseudoarthrosis 09/03/2016  . Dysphagia, idiopathic 05/31/2015  . ARF (acute renal failure) ([place]) 09/03/2014  . UTI (urinary tract infection) 09/03/2014  . Gastroparesis 07/15/2014  . GERD (gastroesophageal reflux disease) 06/29/2014  . Colon adenoma 01/17/2011  . Irritable bowel syndrome 04/12/2010    Past Surgical History:  Procedure Laterality Date  . ABLATION     endometrial ablation  . APPENDECTOMY    . BACK SURGERY  05/14/2011   Lumbar Fusion and cage  . BIOPSY  06/22/2015   Procedure: BIOPSY;  Surgeon: Danie Binder, MD;  [facility];  Service: Endoscopy;;  gastric and esophageal bx's  . BREAST LUMPECTOMY     from the left breast 2003,  2016  . COLECTOMY N/A 08/24/2014   Procedure: TOTAL COLECTOMY;  Surgeon: Jamesetta So, MD;  Location: [facility];  Service:  General;  Laterality: N/A;  . COLONOSCOPY  NOV 2011 SCREENING    HYPERPLASTIC RECTAL POLYP, SML IH, incomplete due to discomfort  . COLONOSCOPY  DEC 2011 w/ PROPOFOL   SERRATED ADENOMA, HYPERPLASTIC POLY[S[  . ESOPHAGEAL DILATION    . ESOPHAGOGASTRODUODENOSCOPY (EGD) WITH PROPOFOL N/A 06/22/2015   Procedure: ESOPHAGOGASTRODUODENOSCOPY (EGD) WITH PROPOFOL;  Surgeon: Danie Binder, MD;  [facility];  Service: Endoscopy;  Laterality: N/A;  . FLEXIBLE SIGMOIDOSCOPY N/A 06/22/2015   Procedure: FLEXIBLE SIGMOIDOSCOPY WITH PROPOFOL;  Surgeon: Danie Binder, MD;  [facility];  Service: Endoscopy;  Laterality: N/A;  0830   . HARDWARE REMOVAL N/A 09/03/2016   Procedure: Removal of Lumbar five-Sacrum one  hardware with Metrex;  Surgeon: Kristeen Miss, MD;  Location: [facility];  Service: Neurosurgery;  Laterality: N/A;  . KNEE SURGERY Bilateral    2 on righ-1 scope and one open; and one on left  . MOUTH SURGERY  07/21/2013  . OVARIAN CYST REMOVAL    . SMART PILL PROCEDURE N/A 07/04/2014   Procedure: SMART PILL PROCEDURE;  Surgeon: Danie Binder, MD;  [facility];  Service: Endoscopy;  Laterality: N/A;  800  . TUBAL LIGATION      OB History    No data available       Home Medications  Prior to Admission medications   Medication Sig Start Date End Date Taking? Authorizing Provider  diazepam (VALIUM) 5 MG tablet Take 5 mg by mouth every 6 (six) hours as needed for muscle spasms.   Yes [provider]  fluticasone (FLONASE) 50 MCG/ACT nasal spray Place 1 spray into both nostrils 2 (two) times daily.   Yes [provider]  furosemide (LASIX) 20 MG tablet Take 20 mg by mouth 2 (two) times a week. Mondays and Fridays   Yes [provider]  gabapentin (NEURONTIN) 300 MG capsule Take 300 mg by mouth 3 (three) times daily.   Yes [provider]  LINZESS 290 MCG CAPS capsule TAKE 1 CAPSULE BY MOUTH EVERY DAY 30 MINUTES PRIOR TO  BREAKFAST 09/12/16  Yes Mahala Menghini, PA-C  loratadine (CLARITIN) 10 MG tablet Take 10 mg by mouth daily.     Yes [provider]  meloxicam (MOBIC) 15 MG tablet Take 15 mg by mouth daily.   Yes [provider]  Olopatadine HCl (PAZEO) 0.7 % SOLN Apply 1 drop to eye daily.   Yes [provider]  omeprazole (PRILOSEC) 20 MG capsule Take 1 capsule (20 mg total) by mouth daily. 09/08/14  Yes Aviva Signs, MD  Oxycodone HCl 10 MG TABS Take 10 mg by mouth 3 (three) times daily.    Yes [provider]  SUMAtriptan (IMITREX) 100 MG tablet Take 100 mg by mouth every 2 (two) hours as needed for migraine or headache. May repeat in 2 hours if headache persists or recurs.   Yes [provider]  topiramate (TOPAMAX) 50 MG tablet Take 50 mg by mouth 2 (two) times daily.    Yes [provider]  Wheat Dextrin (BENEFIBER DRINK MIX PO) Take 15 mLs by mouth 2 (two) times daily. Mixed with water. Healthy Weight   Yes [provider]  ALPRAZolam (XANAX) 0.5 MG tablet Take 1 tablet (0.5 mg total) by mouth daily. Patient not taking: Reported on 10/28/2016 09/08/14   Aviva Signs, MD  ciprofloxacin (CIPRO) 500 MG tablet Take 1 tablet (500 mg total) by mouth 2 (two) times daily. One po bid x 7 days 10/28/16   Milton Ferguson, MD  ondansetron (ZOFRAN ODT) 4 MG disintegrating tablet 4mg  ODT q4 hours prn nausea/vomit 10/28/16   Milton Ferguson, MD  oxyCODONE (OXY IR/ROXICODONE) 5 MG immediate release tablet Take 1-2 tablets (5-10 mg total) by mouth every 3 (three) hours as needed for severe pain. Patient not taking: Reported on 10/28/2016 09/03/16   Kristeen Miss, MD    Family History Family History  Problem Relation Age of Onset  . Breast cancer Maternal Aunt   . Colon cancer Neg Hx   . Colon polyps Neg Hx     Social History Social History  Substance Use Topics  . Smoking status: Current Every Day Smoker    Packs/day: 1.00    Years: 36.00    Types:  Cigarettes  . Smokeless tobacco: Current User     Comment: one pack per day  . Alcohol use Yes     Comment: once a year when they go to the beach     Allergies   Aspirin; Bee venom; Codeine; and Penicillins   Review of Systems Review of Systems  Constitutional: Negative for appetite change and fatigue.  HENT: Negative for congestion, ear discharge and sinus pressure.   Eyes: Negative for discharge.  Respiratory: Negative for cough.   Cardiovascular: Negative for chest pain.  Gastrointestinal: Negative  for abdominal pain and diarrhea.  Genitourinary: Positive for dysuria. Negative for frequency and hematuria.  Musculoskeletal: Negative for back pain.  Skin: Negative for rash.  Neurological: Negative for seizures and headaches.  Psychiatric/Behavioral: Negative for hallucinations.     Physical Exam Updated Vital Signs BP 131/87 (BP Location: Right Arm)   Pulse 95   Temp 98.2 F (36.8 C) (Oral)   Resp 16   Ht 5\' 3"  (1.6 m)   Wt 153 lb 12.8 oz (69.8 kg)   LMP 07/22/2011 Comment: tubal ligation/ablation  SpO2 96%   BMI 27.24 kg/m   Physical Exam  Constitutional: She is oriented to person, place, and time. She appears well-developed.  HENT:  Head: Normocephalic.  Eyes: Conjunctivae are normal.  Neck: No tracheal deviation present.  Cardiovascular:  No murmur heard. Abdominal: There is tenderness.  Tender suprapubic  Musculoskeletal: Normal range of motion.  Neurological: She is oriented to person, place, and time.  Skin: Skin is warm.  Psychiatric: She has a normal mood and affect.     ED Treatments / Results  Labs (all labs ordered are listed, but only abnormal results are displayed) Labs Reviewed  URINALYSIS, ROUTINE W REFLEX MICROSCOPIC - Abnormal; Notable for the following:       Result Value   Color, Urine AMBER (*)    APPearance HAZY (*)    Hgb urine dipstick MODERATE (*)    Protein, ur 30 (*)    Nitrite POSITIVE (*)    Leukocytes, UA TRACE (*)      Bacteria, UA RARE (*)    Squamous Epithelial / LPF 6-30 (*)    All other components within normal limits  URINE CULTURE    EKG  EKG Interpretation None       Radiology No results found.  Procedures Procedures (including critical care time)  Medications Ordered in ED Medications  ciprofloxacin (CIPRO) tablet 500 mg (not administered)  ondansetron (ZOFRAN-ODT) disintegrating tablet 4 mg (4 mg Oral Given 10/28/16 1724)     Initial Impression / Assessment and Plan / ED Course  I have reviewed the triage vital signs and the nursing notes.  Pertinent labs & imaging results that were available during my care of the patient were reviewed by me and considered in my medical decision making (see chart for details).     Patient with urinary tract infection she will be treated with Cipro and follow-up with her PCP  Final Clinical Impressions(s) / ED Diagnoses   Final diagnoses:  Cystitis    New Prescriptions New Prescriptions   CIPROFLOXACIN (CIPRO) 500 MG TABLET    Take 1 tablet (500 mg total) by mouth 2 (two) times daily. One po bid x 7 days   ONDANSETRON (ZOFRAN ODT) 4 MG DISINTEGRATING TABLET    4mg  ODT q4 hours prn nausea/vomit     Milton Ferguson, MD 10/28/16 [id number]

## 2016-10-28 NOTE — Discharge Instructions (Signed)
Drink plenty of fluids and follow-up with your family doctor next week for recheck °

## 2016-10-31 LAB — URINE CULTURE

## 2016-11-01 ENCOUNTER — Telehealth: Payer: Self-pay

## 2016-11-01 NOTE — Telephone Encounter (Signed)
Post ED Visit - Positive Culture Follow-up  Culture report reviewed by antimicrobial stewardship pharmacist:  []  Elenor Quinones, Pharm.D. []  Heide Guile, Pharm.D., BCPS AQ-ID []  Parks Neptune, Pharm.D., BCPS []  Alycia Rossetti, Pharm.D., BCPS []  [place], [place].D., BCPS, AAHIVP [x]  Legrand Como, Pharm.D., BCPS, AAHIVP []  Salome Arnt, PharmD, BCPS []  Dimitri Ped, PharmD, BCPS []  Vincenza Hews, PharmD, BCPS  Positive urine culture Treated with Ciprofloxacin, organism sensitive to the same and no further patient follow-up is required at this time.  Genia Del 11/01/2016, 9:41 AM

## 2016-11-27 ENCOUNTER — Ambulatory Visit: Payer: Medicaid Other | Admitting: Gastroenterology

## 2016-12-26 ENCOUNTER — Ambulatory Visit (INDEPENDENT_AMBULATORY_CARE_PROVIDER_SITE_OTHER): Payer: Medicaid Other | Admitting: Gastroenterology

## 2016-12-26 ENCOUNTER — Encounter: Payer: Self-pay | Admitting: Gastroenterology

## 2016-12-26 DIAGNOSIS — K219 Gastro-esophageal reflux disease without esophagitis: Secondary | ICD-10-CM | POA: Diagnosis not present

## 2016-12-26 DIAGNOSIS — K3184 Gastroparesis: Secondary | ICD-10-CM

## 2016-12-26 DIAGNOSIS — R1310 Dysphagia, unspecified: Secondary | ICD-10-CM

## 2016-12-26 NOTE — Assessment & Plan Note (Signed)
SYMPTOMS FAIRLY WELL CONTROLLED.  CONTINUE OMEPRAZOLE.  TAKE 30 MINUTES PRIOR TO YOUR FIRST MEAL.  

## 2016-12-26 NOTE — Patient Instructions (Signed)
COMPLETE BARIUM SWALLOW WITHIN 7 DAYS.  DO NOT EAT CHUNKS OF ANYTHING OR TAKE ALL YOUR PILLS AT ONE TIME. CRUSH YOUR MEDS IF POSSIBLE. SPEAK TO YOUR PHARMACIST FOR GUIDANCE   FOLLOW A GASTROPARESIS DIET.  CONTINUE OMEPRAZOLE.  TAKE 30 MINUTES PRIOR TO YOUR FIRST MEAL.  FOLLOW UP IN 6 MOS.

## 2016-12-26 NOTE — Progress Notes (Signed)
Subjective:    Patient ID: Alicia Tyler, female    DOB: Aug 19, 1970, 46 y.o.   MRN: [id number]  Monico Blitz, MD  HPI Swallowing is bothering her. Taking meds and couple get stuck and at night. FEEL LIKE STUCK AT TOP OF ESOPHAGUS. CERTAIN FOOD-6 IN [place]ICKEN  SUBWAY. FEELS LIKE SOMETIMES AS WELL WITH BEEF. MAINLY MEAT EVEN SHREDDED. BMs:MAY HAVE DIARRHEA AND SOMETIMES HARD TO GO.  NAUSEA WITH MIGRAINES. TAKES LINZESS AND MAY OCCASIONALLY TAKE A STIMULANT LAXATIVE. RECENTLY TREATED FOR VOMITING/UTI. JUST HAD BACK SURGERY MAR 2018. 3 GOOD ROUNDS OF WATERY STOOLS A DAY. HAPPY WITH THAT.  PT DENIES FEVER, CHILLS, HEMATOCHEZIA, HEMATEMESIS, vomiting, melena, diarrhea, CHEST PAIN, SHORTNESS OF BREATH, CHANGE IN BOWEL IN HABITS, constipation, abdominal pain, OR heartburn or indigestion.  Past Medical History:  Diagnosis Date  . Anxiety   . BMI (body mass index) 20.0-29.01 Apr 2010 172 LBS  . Chronic back pain   . Degenerative disc disease, thoracic   . Headache(784.0)   . Incontinence of feces    WEARS DEPENDS AT HS    . Neuropathy   . PONV (postoperative nausea and vomiting)   . Pseudoobstruction of colon 08/24/2014  . Seasonal allergies   . Serrated adenoma of colon DEC 2011    Past Surgical History:  Procedure Laterality Date  . ABLATION     endometrial ablation  . APPENDECTOMY    . BACK SURGERY  05/14/2011   Lumbar Fusion and cage  . BIOPSY  06/22/2015   Procedure: BIOPSY;  Surgeon: Danie Binder, MD;  [facility];  Service: Endoscopy;;  gastric and esophageal bx's  . BREAST LUMPECTOMY     from the left breast 2003,  2016  . COLECTOMY N/A 08/24/2014   Procedure: TOTAL COLECTOMY;  Surgeon: Jamesetta So, MD;  Location: [facility];  Service: General;  Laterality: N/A;  . COLONOSCOPY  NOV 2011 SCREENING    HYPERPLASTIC RECTAL POLYP, SML IH, incomplete due to discomfort  . COLONOSCOPY  DEC 2011 w/ PROPOFOL   SERRATED ADENOMA, HYPERPLASTIC POLY[S[  . ESOPHAGEAL DILATION    .  ESOPHAGOGASTRODUODENOSCOPY (EGD) WITH PROPOFOL N/A 06/22/2015   Procedure: ESOPHAGOGASTRODUODENOSCOPY (EGD) WITH PROPOFOL;  Surgeon: Danie Binder, MD;  [facility];  Service: Endoscopy;  Laterality: N/A;  . FLEXIBLE SIGMOIDOSCOPY N/A 06/22/2015   Procedure: FLEXIBLE SIGMOIDOSCOPY WITH PROPOFOL;  Surgeon: Danie Binder, MD;  [facility];  Service: Endoscopy;  Laterality: N/A;  0830   . HARDWARE REMOVAL N/A 09/03/2016   Procedure: Removal of Lumbar five-Sacrum one  hardware with Metrex;  Surgeon: Kristeen Miss, MD;  Location: [facility];  Service: Neurosurgery;  Laterality: N/A;  . KNEE SURGERY Bilateral    2 on righ-1 scope and one open; and one on left  . MOUTH SURGERY  07/21/2013  . OVARIAN CYST REMOVAL    . SMART PILL PROCEDURE N/A 07/04/2014   Procedure: SMART PILL PROCEDURE;  Surgeon: Danie Binder, MD;  [facility];  Service: Endoscopy;  Laterality: N/A;  800  . TUBAL LIGATION      Allergies  Allergen Reactions  . Aspirin Shortness Of Breath, Swelling and Rash  . Bee Venom Anaphylaxis       . Codeine Nausea And Vomiting and Rash  . Penicillins Other (See Comments)        Current Outpatient Prescriptions  Medication Sig Dispense Refill  . cyclobenzaprine (FLEXERIL) 10 MG tablet Take 10 mg by mouth 3 (three) times  daily.    . fluticasone (FLONASE) 50 MCG/ACT nasal spray Place 1 spray into both nostrils 2 (two) times daily.    . furosemide (LASIX) 20 MG tablet Take 20 mg by mouth 2 (two) times a week. Mondays and Fridays    . gabapentin (NEURONTIN) 300 MG capsule Take 300 mg by mouth 3 (three) times daily.    Marland Kitchen LINZESS 290 MCG CAPS capsule TAKE 1 CAPSULE BY MOUTH EVERY DAY 30 MINUTES PRIOR TO BREAKFAST    . loratadine (CLARITIN) 10 MG tablet Take 10 mg by mouth daily.      . meloxicam (MOBIC) 15 MG tablet Take 15 mg by mouth daily.    . Olopatadine HCl (PAZEO) 0.7 % SOLN Apply 1 drop to eye daily.    Marland Kitchen omeprazole (PRILOSEC) 20 MG capsule Take 1  capsule (20 mg total) by mouth daily.    . ondansetron (ZOFRAN ODT) 4 MG disintegrating tablet 4mg  ODT q4 hours prn nausea/vomit    . Oxycodone HCl 10 MG TABS Take 10 mg by mouth 3 (three) times daily.     . SUMAtriptan (IMITREX) 100 MG tablet Take 100 mg by mouth every 2 (two) hours as needed for migraine or headache. May repeat in 2 hours if headache persists or recurs.    . TOPAMAX 50 MG tablet Take 50 mg by mouth 2 (two) times daily.     . (BENEFIBER DRINK MIX PO) Take 15 mLs by mouth 2 (two) times daily.     .      .      . diazepam (VALIUM) 5 MG tablet Take 5 mg Q6H as needed for muscle spasms.    .       Review of Systems PER HPI OTHERWISE ALL SYSTEMS ARE NEGATIVE.    Objective:   Physical Exam  Constitutional: She is oriented to person, place, and time. She appears well-developed and well-nourished. No distress.  HENT:  Head: Normocephalic and atraumatic.  Mouth/Throat: Oropharynx is clear and moist. No oropharyngeal exudate.  Eyes: Pupils are equal, round, and reactive to light. No scleral icterus.  Neck: Normal range of motion. Neck supple.  Cardiovascular: Normal rate, regular rhythm and normal heart sounds.   Pulmonary/Chest: Effort normal and breath sounds normal. No respiratory distress.  Abdominal: Soft. Bowel sounds are normal. She exhibits no distension. There is no tenderness.  Musculoskeletal: She exhibits no edema.  Lymphadenopathy:    She has no cervical adenopathy.  Neurological: She is alert and oriented to person, place, and time.  NO FOCAL DEFICITS  Psychiatric: She has a normal mood and affect.  Vitals reviewed.     Assessment & Plan:

## 2016-12-26 NOTE — Assessment & Plan Note (Signed)
SYMPTOMS CONTROLLED/RESOLVED.  CONTINUE TO MONITOR SYMPTOMS. 

## 2016-12-26 NOTE — Progress Notes (Signed)
On recall  °

## 2016-12-26 NOTE — Progress Notes (Signed)
cc'ed to pcp °

## 2016-12-26 NOTE — Assessment & Plan Note (Signed)
SYMPTOMS NOT IDEALLY CONTROLLED. TROUBLE WITH PILLS AND MEAT. GERD FAIRLY WELL CONTROLLED. RECENT EGD/DIL DEC 2016.  BPE WITHIN 7 DAYS. AVOID REFLUX TRIGGERS FOLLOW UP IN 6 MOS.

## 2016-12-31 ENCOUNTER — Other Ambulatory Visit: Payer: Self-pay

## 2016-12-31 ENCOUNTER — Ambulatory Visit (HOSPITAL_COMMUNITY)
Admission: RE | Admit: 2016-12-31 | Discharge: 2016-12-31 | Disposition: A | Discharge status: Home / Self Care | Payer: Medicaid Other | Source: Ambulatory Visit | Attending: Gastroenterology | Admitting: Gastroenterology

## 2016-12-31 DIAGNOSIS — K222 Esophageal obstruction: Secondary | ICD-10-CM | POA: Diagnosis not present

## 2016-12-31 DIAGNOSIS — R1310 Dysphagia, unspecified: Secondary | ICD-10-CM | POA: Diagnosis present

## 2016-12-31 DIAGNOSIS — K228 Other specified diseases of esophagus: Secondary | ICD-10-CM | POA: Insufficient documentation

## 2016-12-31 NOTE — Progress Notes (Signed)
Alicia Tyler, can you address this in Dr. Nona Dell absence?

## 2016-12-31 NOTE — Progress Notes (Signed)
I would recommend an EGD with dilation by Dr. Oneida Alar with PROPOFOL due to narrowing at GE junction that holds up the tablet. There is a possible lesion of distal esophagus, ?Polyp? EGD can help sort this out further.

## 2016-12-31 NOTE — Progress Notes (Signed)
Pt is aware and OK to schedule. It would like to have it before 02/01/2017 if possible. I told her the appts are limited because Dr. Oneida Alar does not do the OR cases just any day. Please call her to discuss before scheduling.

## 2017-01-01 ENCOUNTER — Telehealth: Payer: Self-pay

## 2017-01-01 NOTE — Telephone Encounter (Signed)
Called and informed pt of pre-op appt 01/17/17 at 10:00am. Letter also mailed.

## 2017-01-07 NOTE — Progress Notes (Signed)
Pt is aware. I am mailing her a diet and also the info about her procedure that she has not received in the mail yet.

## 2017-01-07 NOTE — Progress Notes (Signed)
PATIENT ON RECALL FOR FOLLOW UP

## 2017-01-15 NOTE — Patient Instructions (Signed)
Alicia Tyler  01/15/2017     @PREFPERIOPPHARMACY @   Your procedure is scheduled on 01/21/17.  Report to [facility] at 8:00 A.M.  Call this number if you have problems the morning of surgery:  [phone]   Remember:  Do not eat food or drink liquids after midnight.  Take these medicines the morning of surgery with A SIP OF WATER Flexeril, Flonase, Gabapentin, Linzess, Claritin, Mobic, Zofran, Oxycodone, Imitrex,  Topamax   Do not wear jewelry, make-up or nail polish.  Do not wear lotions, powders, or perfumes, or deoderant.  Do not shave 48 hours prior to surgery.  Men may shave face and neck.  Do not bring valuables to the hospital.  [facility] is not responsible for any belongings or valuables.  Contacts, dentures or bridgework may not be worn into surgery.  Leave your suitcase in the car.  After surgery it may be brought to your room.  For patients admitted to the hospital, discharge time will be determined by your treatment team.  Patients discharged the day of surgery will not be allowed to drive home.    Please read over the following fact sheets that you were given. Anesthesia Post-op Instructions     PATIENT INSTRUCTIONS POST-ANESTHESIA  IMMEDIATELY FOLLOWING SURGERY:  Do not drive or operate machinery for the first twenty four hours after surgery.  Do not make any important decisions for twenty four hours after surgery or while taking narcotic pain medications or sedatives.  If you develop intractable nausea and vomiting or a severe headache please notify your doctor immediately.  FOLLOW-UP:  Please make an appointment with your surgeon as instructed. You do not need to follow up with anesthesia unless specifically instructed to do so.  WOUND CARE INSTRUCTIONS (if applicable):  Keep a dry clean dressing on the anesthesia/puncture wound site if there is drainage.  Once the wound has quit draining you may leave it open to air.  Generally you should leave the  bandage intact for twenty four hours unless there is drainage.  If the epidural site drains for more than 36-48 hours please call the anesthesia department.  QUESTIONS?:  Please feel free to call your physician or the hospital operator if you have any questions, and they will be happy to assist you.      Esophagogastroduodenoscopy Esophagogastroduodenoscopy (EGD) is a procedure to examine the lining of the esophagus, stomach, and first part of the small intestine (duodenum). This procedure is done to check for problems such as inflammation, bleeding, ulcers, or growths. During this procedure, a long, flexible, lighted tube with a camera attached (endoscope) is inserted down the throat. Tell a health care provider about:  Any allergies you have.  All medicines you are taking, including vitamins, herbs, eye drops, creams, and over-the-counter medicines.  Any problems you or family members have had with anesthetic medicines.  Any blood disorders you have.  Any surgeries you have had.  Any medical conditions you have.  Whether you are pregnant or may be pregnant. What are the risks? Generally, this is a safe procedure. However, problems may occur, including:  Infection.  Bleeding.  A tear (perforation) in the esophagus, stomach, or duodenum.  Trouble breathing.  Excessive sweating.  Spasms of the larynx.  A slowed heartbeat.  Low blood pressure.  What happens before the procedure?  Follow instructions from your health care provider about eating or drinking restrictions.  Ask your health care provider about: ? Changing or stopping your regular medicines.  This is especially important if you are taking diabetes medicines or blood thinners. ? Taking medicines such as aspirin and ibuprofen. These medicines can thin your blood. Do not take these medicines before your procedure if your health care provider instructs you not to.  Plan to have someone take you home after the  procedure.  If you wear dentures, be ready to remove them before the procedure. What happens during the procedure?  To reduce your risk of infection, your health care team will wash or sanitize their hands.  An IV tube will be put in a vein in your hand or arm. You will get medicines and fluids through this tube.  You will be given one or more of the following: ? A medicine to help you relax (sedative). ? A medicine to numb the area (local anesthetic). This medicine may be sprayed into your throat. It will make you feel more comfortable and keep you from gagging or coughing during the procedure. ? A medicine for pain.  A mouth guard may be placed in your mouth to protect your teeth and to keep you from biting on the endoscope.  You will be asked to lie on your left side.  The endoscope will be lowered down your throat into your esophagus, stomach, and duodenum.  Air will be put into the endoscope. This will help your health care provider see better.  The lining of your esophagus, stomach, and duodenum will be examined.  Your health care provider may: ? Take a tissue sample so it can be looked at in a lab (biopsy). ? Remove growths. ? Remove objects (foreign bodies) that are stuck. ? Treat any bleeding with medicines or other devices that stop tissue from bleeding. ? Widen (dilate) or stretch narrowed areas of your esophagus and stomach.  The endoscope will be taken out. The procedure may vary among health care providers and hospitals. What happens after the procedure?  Your blood pressure, heart rate, breathing rate, and blood oxygen level will be monitored often until the medicines you were given have worn off.  Do not eat or drink anything until the numbing medicine has worn off and your gag reflex has returned. This information is not intended to replace advice given to you by your health care provider. Make sure you discuss any questions you have with your health care  provider. Document Released: 10/11/2004 Document Revised: 11/16/2015 Document Reviewed: 05/04/2015 Elsevier Interactive Patient Education  2018 [employer]. Esophageal Dilatation Esophageal dilatation is a procedure to open a blocked or narrowed part of the esophagus. The esophagus is the long tube in your throat that carries food and liquid from your mouth to your stomach. The procedure is also called esophageal dilation. You may need this procedure if you have a buildup of scar tissue in your esophagus that makes it difficult, painful, or even impossible to swallow. This can be caused by gastroesophageal reflux disease (GERD). In rare cases, people need this procedure because they have cancer of the esophagus or a problem with the way food moves through the esophagus. Sometimes you may need to have another dilatation to enlarge the opening of the esophagus gradually. Tell a health care provider about:  Any allergies you have.  All medicines you are taking, including vitamins, herbs, eye drops, creams, and over-the-counter medicines.  Any problems you or family members have had with anesthetic medicines.  Any blood disorders you have.  Any surgeries you have had.  Any medical conditions you have.  Any antibiotic medicines you are required to take before dental procedures. What are the risks? Generally, this is a safe procedure. However, problems can occur and include:  Bleeding from a tear in the lining of the esophagus.  A hole (perforation) in the esophagus.  What happens before the procedure?  Do not eat or drink anything after midnight on the night before the procedure or as directed by your health care provider.  Ask your health care provider about changing or stopping your regular medicines. This is especially important if you are taking diabetes medicines or blood thinners.  Plan to have someone take you home after the procedure. What happens during the procedure?  You  will be given a medicine that makes you relaxed and sleepy (sedative).  A medicine may be sprayed or gargled to numb the back of the throat.  Your health care provider can use various instruments to do an esophageal dilatation. During the procedure, the instrument used will be placed in your mouth and passed down into your esophagus. Options include: ? Simple dilators. This instrument is carefully placed in the esophagus to stretch it. ? Guided wire bougies. In this method, a flexible tube (endoscope) is used to insert a wire into the esophagus. The dilator is passed over this wire to enlarge the esophagus. Then the wire is removed. ? Balloon dilators. An endoscope with a small balloon at the end is passed down into the esophagus. Inflating the balloon gently stretches the esophagus and opens it up. What happens after the procedure?  Your blood pressure, heart rate, breathing rate, and blood oxygen level will be monitored often until the medicines you were given have worn off.  Your throat may feel slightly sore and will probably still feel numb. This will improve slowly over time.  You will not be allowed to eat or drink until the throat numbness has resolved.  If this is a same-day procedure, you may be allowed to go home once you have been able to drink, urinate, and sit on the edge of the bed without nausea or dizziness.  If this is a same-day procedure, you should have a friend or family member with you for the next 24 hours after the procedure. This information is not intended to replace advice given to you by your health care provider. Make sure you discuss any questions you have with your health care provider. Document Released: 08/01/2005 Document Revised: 11/16/2015 Document Reviewed: 10/20/2013 Elsevier Interactive Patient Education  2017 [employer].

## 2017-01-17 ENCOUNTER — Encounter (HOSPITAL_COMMUNITY): Payer: Self-pay

## 2017-01-17 ENCOUNTER — Encounter (HOSPITAL_COMMUNITY)
Admission: RE | Admit: 2017-01-17 | Discharge: 2017-01-17 | Disposition: A | Discharge status: Home / Self Care | Payer: Medicaid Other | Source: Ambulatory Visit | Attending: Gastroenterology | Admitting: Gastroenterology

## 2017-01-17 DIAGNOSIS — Z01812 Encounter for preprocedural laboratory examination: Secondary | ICD-10-CM | POA: Insufficient documentation

## 2017-01-17 LAB — SURGICAL PCR SCREEN
MRSA, PCR: NEGATIVE
STAPHYLOCOCCUS AUREUS: NEGATIVE

## 2017-01-17 LAB — BASIC METABOLIC PANEL
ANION GAP: 8 (ref 5–15)
BUN: 14 mg/dL (ref 6–20)
CALCIUM: 9.9 mg/dL (ref 8.9–10.3)
CO2: 25 mmol/L (ref 22–32)
Chloride: 105 mmol/L (ref 101–111)
Creatinine, Ser: 0.79 mg/dL (ref 0.44–1.00)
Glucose, Bld: 93 mg/dL (ref 65–99)
POTASSIUM: 3.9 mmol/L (ref 3.5–5.1)
Sodium: 138 mmol/L (ref 135–145)

## 2017-01-17 LAB — CBC
HEMATOCRIT: 42.6 % (ref 36.0–46.0)
Hemoglobin: 14 g/dL (ref 12.0–15.0)
MCH: 29.2 pg (ref 26.0–34.0)
MCHC: 32.9 g/dL (ref 30.0–36.0)
MCV: 88.9 fL (ref 78.0–100.0)
Platelets: 246 10*3/uL (ref 150–400)
RBC: 4.79 MIL/uL (ref 3.87–5.11)
RDW: 13.9 % (ref 11.5–15.5)
WBC: 6.1 10*3/uL (ref 4.0–10.5)

## 2017-01-21 ENCOUNTER — Ambulatory Visit (HOSPITAL_COMMUNITY): Payer: Medicaid Other | Admitting: Anesthesiology

## 2017-01-21 ENCOUNTER — Encounter: Payer: Self-pay | Admitting: Gastroenterology

## 2017-01-21 ENCOUNTER — Encounter (HOSPITAL_COMMUNITY): Payer: Self-pay

## 2017-01-21 ENCOUNTER — Ambulatory Visit (HOSPITAL_COMMUNITY)
Admission: RE | Admit: 2017-01-21 | Discharge: 2017-01-21 | Disposition: A | Discharge status: Home / Self Care | Payer: Medicaid Other | Source: Ambulatory Visit | Attending: Gastroenterology | Admitting: Gastroenterology

## 2017-01-21 ENCOUNTER — Encounter (HOSPITAL_COMMUNITY)
Admission: RE | Disposition: A | Discharge status: Home / Self Care | Payer: Self-pay | Source: Ambulatory Visit | Attending: Gastroenterology

## 2017-01-21 DIAGNOSIS — Z79899 Other long term (current) drug therapy: Secondary | ICD-10-CM | POA: Insufficient documentation

## 2017-01-21 DIAGNOSIS — Z86010 Personal history of colonic polyps: Secondary | ICD-10-CM | POA: Diagnosis not present

## 2017-01-21 DIAGNOSIS — Z885 Allergy status to narcotic agent status: Secondary | ICD-10-CM | POA: Insufficient documentation

## 2017-01-21 DIAGNOSIS — M545 Low back pain: Secondary | ICD-10-CM | POA: Insufficient documentation

## 2017-01-21 DIAGNOSIS — G8929 Other chronic pain: Secondary | ICD-10-CM | POA: Diagnosis not present

## 2017-01-21 DIAGNOSIS — M1990 Unspecified osteoarthritis, unspecified site: Secondary | ICD-10-CM | POA: Diagnosis not present

## 2017-01-21 DIAGNOSIS — F419 Anxiety disorder, unspecified: Secondary | ICD-10-CM | POA: Diagnosis not present

## 2017-01-21 DIAGNOSIS — F17200 Nicotine dependence, unspecified, uncomplicated: Secondary | ICD-10-CM | POA: Insufficient documentation

## 2017-01-21 DIAGNOSIS — R159 Full incontinence of feces: Secondary | ICD-10-CM | POA: Insufficient documentation

## 2017-01-21 DIAGNOSIS — K222 Esophageal obstruction: Secondary | ICD-10-CM | POA: Diagnosis not present

## 2017-01-21 DIAGNOSIS — K219 Gastro-esophageal reflux disease without esophagitis: Secondary | ICD-10-CM | POA: Diagnosis not present

## 2017-01-21 DIAGNOSIS — K3184 Gastroparesis: Secondary | ICD-10-CM | POA: Insufficient documentation

## 2017-01-21 DIAGNOSIS — K589 Irritable bowel syndrome without diarrhea: Secondary | ICD-10-CM | POA: Insufficient documentation

## 2017-01-21 DIAGNOSIS — Z880 Allergy status to penicillin: Secondary | ICD-10-CM | POA: Diagnosis not present

## 2017-01-21 DIAGNOSIS — R933 Abnormal findings on diagnostic imaging of other parts of digestive tract: Secondary | ICD-10-CM | POA: Diagnosis not present

## 2017-01-21 DIAGNOSIS — Z981 Arthrodesis status: Secondary | ICD-10-CM | POA: Insufficient documentation

## 2017-01-21 DIAGNOSIS — Z91030 Bee allergy status: Secondary | ICD-10-CM | POA: Diagnosis not present

## 2017-01-21 DIAGNOSIS — Z886 Allergy status to analgesic agent status: Secondary | ICD-10-CM | POA: Insufficient documentation

## 2017-01-21 DIAGNOSIS — R1310 Dysphagia, unspecified: Secondary | ICD-10-CM | POA: Diagnosis present

## 2017-01-21 HISTORY — PX: SAVORY DILATION: SHX5439

## 2017-01-21 HISTORY — PX: ESOPHAGOGASTRODUODENOSCOPY (EGD) WITH PROPOFOL: SHX5813

## 2017-01-21 SURGERY — ESOPHAGOGASTRODUODENOSCOPY (EGD) WITH PROPOFOL
Anesthesia: Monitor Anesthesia Care

## 2017-01-21 MED ORDER — MINERAL OIL PO OIL
TOPICAL_OIL | ORAL | Status: AC
  Filled 2017-01-21: qty 30

## 2017-01-21 MED ORDER — PROPOFOL 10 MG/ML IV BOLUS
INTRAVENOUS | Status: AC
  Filled 2017-01-21: qty 20

## 2017-01-21 MED ORDER — MIDAZOLAM HCL 2 MG/2ML IJ SOLN
1.0000 mg | INTRAMUSCULAR | Status: AC
  Administered 2017-01-21: 2 mg via INTRAVENOUS

## 2017-01-21 MED ORDER — FENTANYL CITRATE (PF) 100 MCG/2ML IJ SOLN
INTRAMUSCULAR | Status: AC
  Filled 2017-01-21: qty 2

## 2017-01-21 MED ORDER — LIDOCAINE VISCOUS 2 % MT SOLN
OROMUCOSAL | Status: AC
  Filled 2017-01-21: qty 15

## 2017-01-21 MED ORDER — CHLORHEXIDINE GLUCONATE CLOTH 2 % EX PADS: 6.0000 | MEDICATED_PAD | Freq: Once | CUTANEOUS | Status: DC

## 2017-01-21 MED ORDER — PROPOFOL 500 MG/50ML IV EMUL
INTRAVENOUS | Status: DC | PRN
  Administered 2017-01-21: 125 ug/kg/min via INTRAVENOUS

## 2017-01-21 MED ORDER — LACTATED RINGERS IV SOLN
INTRAVENOUS | Status: DC
  Administered 2017-01-21: 09:00:00 via INTRAVENOUS

## 2017-01-21 MED ORDER — MIDAZOLAM HCL 2 MG/2ML IJ SOLN
INTRAMUSCULAR | Status: AC
  Filled 2017-01-21: qty 2

## 2017-01-21 MED ORDER — MIDAZOLAM HCL 5 MG/5ML IJ SOLN
INTRAMUSCULAR | Status: DC | PRN
  Administered 2017-01-21: 2 mg via INTRAVENOUS

## 2017-01-21 MED ORDER — LIDOCAINE VISCOUS 2 % MT SOLN
15.0000 mL | Freq: Once | OROMUCOSAL | Status: AC
  Administered 2017-01-21: 15 mL via OROMUCOSAL

## 2017-01-21 MED ORDER — FENTANYL CITRATE (PF) 100 MCG/2ML IJ SOLN
25.0000 ug | Freq: Once | INTRAMUSCULAR | Status: AC
  Administered 2017-01-21: 25 ug via INTRAVENOUS

## 2017-01-21 NOTE — Anesthesia Preprocedure Evaluation (Signed)
Anesthesia Evaluation  Patient identified by MRN, date of birth, ID band Patient awake    Reviewed: Allergy & Precautions, H&P , NPO status , Patient's Chart, lab work & pertinent test results  History of Anesthesia Complications (+) PONV and history of anesthetic complications  Airway Mallampati: II  TM Distance: >3 FB Neck ROM: Full    Dental  (+) Dental Advisory Given, Teeth Intact   Pulmonary Current Smoker,    breath sounds clear to auscultation       Cardiovascular negative cardio ROS   Rhythm:Regular Rate:Normal     Neuro/Psych  Headaches, PSYCHIATRIC DISORDERS Anxiety    GI/Hepatic GERD  Medicated and Poorly Controlled,IBS, gastroparesis   Endo/Other    Renal/GU Renal disease     Musculoskeletal  (+) Arthritis  (chronic LBP, lumbar fusion),   Abdominal   Peds  Hematology   Anesthesia Other Findings Chronic narcotics for back pain  Reproductive/Obstetrics                             Anesthesia Physical Anesthesia Plan  ASA: III  Anesthesia Plan: MAC   Post-op Pain Management:    Induction: Intravenous  PONV Risk Score and Plan:   Airway Management Planned: Simple Face Mask  Additional Equipment:   Intra-op Plan:   Post-operative Plan:   Informed Consent: I have reviewed the patients History and Physical, chart, labs and discussed the procedure including the risks, benefits and alternatives for the proposed anesthesia with the patient or authorized representative who has indicated his/her understanding and acceptance.     Plan Discussed with:   Anesthesia Plan Comments:         Anesthesia Quick Evaluation

## 2017-01-21 NOTE — Anesthesia Postprocedure Evaluation (Signed)
Anesthesia Post Note  Patient: [name]  Procedure(s) Performed: Procedure(s) (LRB): ESOPHAGOGASTRODUODENOSCOPY (EGD) WITH PROPOFOL (N/A) SAVORY DILATION (N/A)  Patient location during evaluation: PACU Anesthesia Type: MAC Level of consciousness: awake and patient cooperative Pain management: pain level controlled Vital Signs Assessment: post-procedure vital signs reviewed and stable Respiratory status: spontaneous breathing, nonlabored ventilation, respiratory function stable and patient connected to nasal cannula oxygen Cardiovascular status: blood pressure returned to baseline Postop Assessment: no signs of nausea or vomiting Anesthetic complications: no     Last Vitals:  Vitals:   01/21/17 0905 01/21/17 0910  BP: 116/78 115/82  Pulse:    Resp:    Temp:      Last Pain:  Vitals:   01/21/17 0822  TempSrc: Oral  PainSc: 5                  Shalina Norfolk J

## 2017-01-21 NOTE — H&P (View-Only) (Signed)
Subjective:    Patient ID: Alicia Tyler, female    DOB: February 10, 1971, 46 y.o.   MRN: [id number]  Monico Blitz, MD  HPI Swallowing is bothering her. Taking meds and couple get stuck and at night. FEEL LIKE STUCK AT TOP OF ESOPHAGUS. CERTAIN FOOD-6 IN CHICKEN  SUBWAY. FEELS LIKE SOMETIMES AS WELL WITH BEEF. MAINLY MEAT EVEN SHREDDED. BMs:MAY HAVE DIARRHEA AND SOMETIMES HARD TO GO.  NAUSEA WITH MIGRAINES. TAKES LINZESS AND MAY OCCASIONALLY TAKE A STIMULANT LAXATIVE. RECENTLY TREATED FOR VOMITING/UTI. JUST HAD BACK SURGERY MAR 2018. 3 GOOD ROUNDS OF WATERY STOOLS A DAY. HAPPY WITH THAT.  PT DENIES FEVER, CHILLS, HEMATOCHEZIA, HEMATEMESIS, vomiting, melena, diarrhea, CHEST PAIN, SHORTNESS OF BREATH, CHANGE IN BOWEL IN HABITS, constipation, abdominal pain, OR heartburn or indigestion.  Past Medical History:  Diagnosis Date  . Anxiety   . BMI (body mass index) 20.0-29.01 Apr 2010 172 LBS  . Chronic back pain   . Degenerative disc disease, thoracic   . Headache(784.0)   . Incontinence of feces    WEARS DEPENDS AT HS    . Neuropathy   . PONV (postoperative nausea and vomiting)   . Pseudoobstruction of colon 08/24/2014  . Seasonal allergies   . Serrated adenoma of colon DEC 2011    Past Surgical History:  Procedure Laterality Date  . ABLATION     endometrial ablation  . APPENDECTOMY    . BACK SURGERY  05/14/2011   Lumbar Fusion and cage  . BIOPSY  06/22/2015   Procedure: BIOPSY;  Surgeon: Danie Binder, MD;  Location: [facility];  Service: Endoscopy;;  gastric and esophageal bx's  . BREAST LUMPECTOMY     from the left breast 2003,  2016  . COLECTOMY N/A 08/24/2014   Procedure: TOTAL COLECTOMY;  Surgeon: Jamesetta So, MD;  Location: [facility];  Service: General;  Laterality: N/A;  . COLONOSCOPY  NOV 2011 SCREENING    HYPERPLASTIC RECTAL POLYP, SML IH, incomplete due to discomfort  . COLONOSCOPY  DEC 2011 w/ PROPOFOL   SERRATED ADENOMA, HYPERPLASTIC POLY[S[  . ESOPHAGEAL DILATION    .  ESOPHAGOGASTRODUODENOSCOPY (EGD) WITH PROPOFOL N/A 06/22/2015   Procedure: ESOPHAGOGASTRODUODENOSCOPY (EGD) WITH PROPOFOL;  Surgeon: Danie Binder, MD;  Location: [facility];  Service: Endoscopy;  Laterality: N/A;  . FLEXIBLE SIGMOIDOSCOPY N/A 06/22/2015   Procedure: FLEXIBLE SIGMOIDOSCOPY WITH PROPOFOL;  Surgeon: Danie Binder, MD;  Location: [facility];  Service: Endoscopy;  Laterality: N/A;  0830   . HARDWARE REMOVAL N/A 09/03/2016   Procedure: Removal of Lumbar five-Sacrum one  hardware with Metrex;  Surgeon: Kristeen Miss, MD;  Location: [facility];  Service: Neurosurgery;  Laterality: N/A;  . KNEE SURGERY Bilateral    2 on righ-1 scope and one open; and one on left  . MOUTH SURGERY  07/21/2013  . OVARIAN CYST REMOVAL    . SMART PILL PROCEDURE N/A 07/04/2014   Procedure: SMART PILL PROCEDURE;  Surgeon: Danie Binder, MD;  Location: [facility];  Service: Endoscopy;  Laterality: N/A;  800  . TUBAL LIGATION      Allergies  Allergen Reactions  . Aspirin Shortness Of Breath, Swelling and Rash  . Bee Venom Anaphylaxis       . Codeine Nausea And Vomiting and Rash  . Penicillins Other (See Comments)        Current Outpatient Prescriptions  Medication Sig Dispense Refill  . cyclobenzaprine (FLEXERIL) 10 MG tablet Take 10 mg by mouth 3 (three) times  daily.    . fluticasone (FLONASE) 50 MCG/ACT nasal spray Place 1 spray into both nostrils 2 (two) times daily.    . furosemide (LASIX) 20 MG tablet Take 20 mg by mouth 2 (two) times a week. Mondays and Fridays    . gabapentin (NEURONTIN) 300 MG capsule Take 300 mg by mouth 3 (three) times daily.    Marland Kitchen LINZESS 290 MCG CAPS capsule TAKE 1 CAPSULE BY MOUTH EVERY DAY 30 MINUTES PRIOR TO BREAKFAST    . loratadine (CLARITIN) 10 MG tablet Take 10 mg by mouth daily.      . meloxicam (MOBIC) 15 MG tablet Take 15 mg by mouth daily.    . Olopatadine HCl (PAZEO) 0.7 % SOLN Apply 1 drop to eye daily.    Marland Kitchen omeprazole (PRILOSEC) 20 MG capsule Take 1  capsule (20 mg total) by mouth daily.    . ondansetron (ZOFRAN ODT) 4 MG disintegrating tablet 4mg  ODT q4 hours prn nausea/vomit    . Oxycodone HCl 10 MG TABS Take 10 mg by mouth 3 (three) times daily.     . SUMAtriptan (IMITREX) 100 MG tablet Take 100 mg by mouth every 2 (two) hours as needed for migraine or headache. May repeat in 2 hours if headache persists or recurs.    . TOPAMAX 50 MG tablet Take 50 mg by mouth 2 (two) times daily.     . (BENEFIBER DRINK MIX PO) Take 15 mLs by mouth 2 (two) times daily.     .      .      . diazepam (VALIUM) 5 MG tablet Take 5 mg Q6H as needed for muscle spasms.    .       Review of Systems PER HPI OTHERWISE ALL SYSTEMS ARE NEGATIVE.    Objective:   Physical Exam  Constitutional: She is oriented to person, place, and time. She appears well-developed and well-nourished. No distress.  HENT:  Head: Normocephalic and atraumatic.  Mouth/Throat: Oropharynx is clear and moist. No oropharyngeal exudate.  Eyes: Pupils are equal, round, and reactive to light. No scleral icterus.  Neck: Normal range of motion. Neck supple.  Cardiovascular: Normal rate, regular rhythm and normal heart sounds.   Pulmonary/Chest: Effort normal and breath sounds normal. No respiratory distress.  Abdominal: Soft. Bowel sounds are normal. She exhibits no distension. There is no tenderness.  Musculoskeletal: She exhibits no edema.  Lymphadenopathy:    She has no cervical adenopathy.  Neurological: She is alert and oriented to person, place, and time.  NO FOCAL DEFICITS  Psychiatric: She has a normal mood and affect.  Vitals reviewed.     Assessment & Plan:

## 2017-01-21 NOTE — Interval H&P Note (Signed)
History and Physical Interval Note:  01/21/2017 9:11 AM  Alicia Tyler  has presented today for surgery, with the diagnosis of dysphagia -narrowing at GE junction seen on DG Esophagus-  The various methods of treatment have been discussed with the patient and family. After consideration of risks, benefits and other options for treatment, the patient has consented to  Procedure(s) with comments: ESOPHAGOGASTRODUODENOSCOPY (EGD) WITH PROPOFOL (N/A) - 9:30am SAVORY DILATION (N/A) as a surgical intervention .  The patient's history has been reviewed, patient examined, no change in status, stable for surgery.  I have reviewed the patient's chart and labs.  Questions were answered to the patient's satisfaction.     [employer]

## 2017-01-21 NOTE — Op Note (Signed)
[facility] Patient Name: Alicia Tyler Procedure Date: 01/21/2017 8:53 AM MRN: [id number] Date of Birth: 08/25/1970 Attending MD: Barney Drain , MD CSN: [id number] Age: 46 Admit Type: Outpatient Procedure:                Upper GI endoscopy WITH ESOPHAGEAL DILATION Indications:              Dysphagia, Abnormal UGI series-BPE JUL 2018 12.5 MM                            BARIUM PILL LODGED AT [place] HAD TO                            DISSOLVE TO PASS. Providers:                Barney Drain, MD, Charlyne Petrin RN, RN, Aram Candela Referring MD:             Fuller Canada Manuella Ghazi MD, MD Medicines:                Propofol per Anesthesia Complications:            No immediate complications. Estimated Blood Loss:     Estimated blood loss was minimal. Procedure:                Pre-Anesthesia Assessment:                           - Prior to the procedure, a History and Physical                            was performed, and patient medications and                            allergies were reviewed. The patient's tolerance of                            previous anesthesia was also reviewed. The risks                            and benefits of the procedure and the sedation                            options and risks were discussed with the patient.                            All questions were answered, and informed consent                            was obtained. Prior Anticoagulants: The patient has                            taken previous NSAID medication, last dose was 1  day prior to procedure. ASA Grade Assessment: II -                            A patient with mild systemic disease. After                            reviewing the risks and benefits, the patient was                            deemed in satisfactory condition to undergo the                            procedure. After obtaining informed consent, the              endoscope was passed under direct vision.                            Throughout the procedure, the patient's blood                            pressure, pulse, and oxygen saturations were                            monitored continuously. The [id number] ([id number])                            scope was introduced through the mouth, and                            advanced to the second part of duodenum. The upper                            GI endoscopy was accomplished without difficulty.                            The patient tolerated the procedure well. Scope In: 9:33:53 AM Scope Out: 9:40:36 AM Total Procedure Duration: 0 hours 6 minutes 43 seconds  Findings:      One mild (non-circumferential scarring) benign-appearing, intrinsic       stenosis was found. And was traversed. A guidewire was placed and the       scope was withdrawn. Dilation was performed with a Savary dilator with       mild resistance at 14 mm, 15 mm, 16 mm and 17 mm. Estimated blood loss       was minimal.      The entire examined stomach was normal.      The examined duodenum was normal. Impression:               - MILD Benign-appearing esophageal stenosis. Moderate Sedation:      Per Anesthesia Care Recommendation:           - Resume previous diet.                           - Continue present medications. CALL IN ONE MONTH  IF SYMPTOMS ARE NOT IMPROVED. IF NOT PT WILL NEED                            ESOPHAGEAL MANOMETRY.                           - Return to my office in 6 months.                           - Patient has a contact number available for                            emergencies. The signs and symptoms of potential                            delayed complications were discussed with the                            patient. Return to normal activities tomorrow.                            Written discharge instructions were provided to the                             patient. Procedure Code(s):        --- Professional ---                           [phone], Esophagogastroduodenoscopy, flexible,                            transoral; with insertion of guide wire followed by                            passage of dilator(s) through esophagus over guide                            wire Diagnosis Code(s):        --- Professional ---                           K22.2, Esophageal obstruction                           R13.10, Dysphagia, unspecified                           R93.3, Abnormal findings on diagnostic imaging of                            other parts of digestive tract CPT copyright 2016 [employer]. All rights reserved. The codes documented in this report are preliminary and upon coder review may  be revised to meet current compliance requirements. Barney Drain, MD Barney Drain, MD 01/21/2017 9:59:16 AM This report has been signed electronically. Number of Addenda: 0

## 2017-01-21 NOTE — Discharge Instructions (Signed)
I dilated your esophagus. You have a stricture near the base of your esophagus.      CONTINUE OMEPRAZOLE.  TAKE 30 MINUTES PRIOR TO YOUR FIRST MEAL.  PLEASE PLEASE CALL IN ONE MONTH IF YOUR STILL HAVING PROBLEMS SWALLOWING.  FOLLOW UP IN 6 MOS.  UPPER ENDOSCOPY AFTER CARE Read the instructions outlined below and refer to this sheet in the next week. These discharge instructions provide you with general information on caring for yourself after you leave the hospital. While your treatment has been planned according to the most current medical practices available, unavoidable complications occasionally occur. If you have any problems or questions after discharge, call DR. Jennika Ringgold, [phone].  ACTIVITY  You may resume your regular activity, but move at a slower pace for the next 24 hours.   Take frequent rest periods for the next 24 hours.   Walking will help get rid of the air and reduce the bloated feeling in your belly (abdomen).   No driving for 24 hours (because of the medicine (anesthesia) used during the test).   You may shower.   Do not sign any important legal documents or operate any machinery for 24 hours (because of the anesthesia used during the test).    NUTRITION  Drink plenty of fluids.   You may resume your normal diet as instructed by your doctor.   Begin with a light meal and progress to your normal diet. Heavy or fried foods are harder to digest and may make you feel sick to your stomach (nauseated).   Avoid alcoholic beverages for 24 hours or as instructed.    MEDICATIONS  You may resume your normal medications.   WHAT YOU CAN EXPECT TODAY  Some feelings of bloating in the abdomen.   Passage of more gas than usual.    IF YOU HAD A BIOPSY TAKEN DURING THE UPPER ENDOSCOPY:  Eat a soft diet IF YOU HAVE NAUSEA, BLOATING, ABDOMINAL PAIN, OR VOMITING.    FINDING OUT THE RESULTS OF YOUR TEST Not all test results are available during your visit.  DR. Oneida Alar WILL CALL YOU WITHIN 7 DAYS OF YOUR PROCEDUE WITH YOUR RESULTS. Do not assume everything is normal if you have not heard from DR. Jesi Jurgens IN ONE WEEK, CALL HER OFFICE AT [phone].  SEEK IMMEDIATE MEDICAL ATTENTION AND CALL THE OFFICE: [phone] IF:  You have more than a spotting of blood in your stool.   Your belly is swollen (abdominal distention).   You are nauseated or vomiting.   You have a temperature over 101F.   You have abdominal pain or discomfort that is severe or gets worse throughout the day.  Gastritis  Gastritis is an inflammation (the body's way of reacting to injury and/or infection) of the stomach. It is often caused by viral or bacterial (germ) infections. It can also be caused BY ASPIRIN, BC/GOODY POWDER'S, (IBUPROFEN) MOTRIN, OR ALEVE (NAPROXEN), chemicals (including alcohol), SPICY FOODS, and medications. This illness may be associated with generalized malaise (feeling tired, not well), UPPER ABDOMINAL STOMACH cramps, and fever. One common bacterial cause of gastritis is an organism known as H. Pylori. This can be treated with antibiotics.   ESOPHAGEAL STRICTURE  Esophageal strictures can be caused by stomach acid backing up into the tube that carries food from the mouth down to the stomach (lower esophagus).  TREATMENT There are a number of  medicines used to treat reflux/stricture, including: Antacids.  Proton-pump inhibitors: OMEPRAZOLE ZANTAC OR PEPCID  HOME CARE INSTRUCTIONS Eat 2-3  hours before going to bed.  Try to reach and maintain a healthy weight.  Do not eat just a few very large meals. Instead, eat 4 TO 6 smaller meals throughout the day.  Try to identify foods and beverages that make your symptoms worse, and avoid these.  Avoid tight clothing.  Do not exercise right after eating.

## 2017-01-21 NOTE — Transfer of Care (Signed)
Immediate Anesthesia Transfer of Care Note  Patient: Alicia Tyler  Procedure(s) Performed: Procedure(s) with comments: ESOPHAGOGASTRODUODENOSCOPY (EGD) WITH PROPOFOL (N/A) - 9:30am SAVORY DILATION (N/A)  Patient Location: PACU  Anesthesia Type:MAC  Level of Consciousness: awake and patient cooperative  Airway & Oxygen Therapy: Patient Spontanous Breathing and Patient connected to face mask oxygen  Post-op Assessment: Report given to RN and Post -op Vital signs reviewed and stable  Post vital signs: Reviewed and stable  Last Vitals:  Vitals:   01/21/17 0905 01/21/17 0910  BP: 116/78 115/82  Pulse:    Resp:    Temp:      Last Pain:  Vitals:   01/21/17 0822  TempSrc: Oral  PainSc: 5          Complications: No apparent anesthesia complications

## 2017-01-22 ENCOUNTER — Encounter (HOSPITAL_COMMUNITY): Payer: Self-pay | Admitting: Gastroenterology

## 2017-02-06 ENCOUNTER — Other Ambulatory Visit (HOSPITAL_COMMUNITY): Payer: Self-pay | Admitting: Neurological Surgery

## 2017-02-06 DIAGNOSIS — M5416 Radiculopathy, lumbar region: Secondary | ICD-10-CM

## 2017-02-13 ENCOUNTER — Ambulatory Visit (HOSPITAL_COMMUNITY)
Admission: RE | Admit: 2017-02-13 | Discharge: 2017-02-13 | Disposition: A | Discharge status: Home / Self Care | Payer: Medicaid Other | Source: Ambulatory Visit | Attending: Neurological Surgery | Admitting: Neurological Surgery

## 2017-02-13 DIAGNOSIS — Z981 Arthrodesis status: Secondary | ICD-10-CM | POA: Insufficient documentation

## 2017-02-13 DIAGNOSIS — M5416 Radiculopathy, lumbar region: Secondary | ICD-10-CM | POA: Diagnosis not present

## 2017-02-13 DIAGNOSIS — M47817 Spondylosis without myelopathy or radiculopathy, lumbosacral region: Secondary | ICD-10-CM | POA: Insufficient documentation

## 2017-06-19 ENCOUNTER — Other Ambulatory Visit (HOSPITAL_COMMUNITY): Payer: Self-pay | Admitting: Neurological Surgery

## 2017-06-19 DIAGNOSIS — R29898 Other symptoms and signs involving the musculoskeletal system: Secondary | ICD-10-CM

## 2017-06-23 ENCOUNTER — Ambulatory Visit (HOSPITAL_COMMUNITY)
Admission: RE | Admit: 2017-06-23 | Discharge: 2017-06-23 | Disposition: A | Discharge status: Home / Self Care | Payer: Medicaid Other | Source: Ambulatory Visit | Attending: Neurological Surgery | Admitting: Neurological Surgery

## 2017-06-23 DIAGNOSIS — R29898 Other symptoms and signs involving the musculoskeletal system: Secondary | ICD-10-CM

## 2017-07-23 ENCOUNTER — Ambulatory Visit: Payer: Medicaid Other | Admitting: Nurse Practitioner

## 2017-07-24 ENCOUNTER — Other Ambulatory Visit: Payer: Self-pay

## 2017-07-24 ENCOUNTER — Encounter (HOSPITAL_COMMUNITY): Payer: Self-pay

## 2017-07-24 ENCOUNTER — Emergency Department (HOSPITAL_COMMUNITY)
Admission: EM | Admit: 2017-07-24 | Discharge: 2017-07-24 | Disposition: A | Discharge status: Home / Self Care | Payer: Medicaid Other | Attending: Emergency Medicine | Admitting: Emergency Medicine

## 2017-07-24 ENCOUNTER — Emergency Department (HOSPITAL_COMMUNITY): Payer: Medicaid Other

## 2017-07-24 DIAGNOSIS — R109 Unspecified abdominal pain: Secondary | ICD-10-CM | POA: Diagnosis not present

## 2017-07-24 DIAGNOSIS — M545 Low back pain, unspecified: Secondary | ICD-10-CM

## 2017-07-24 DIAGNOSIS — F17210 Nicotine dependence, cigarettes, uncomplicated: Secondary | ICD-10-CM | POA: Insufficient documentation

## 2017-07-24 DIAGNOSIS — Z79899 Other long term (current) drug therapy: Secondary | ICD-10-CM | POA: Insufficient documentation

## 2017-07-24 HISTORY — DX: Irritable bowel syndrome, unspecified: K58.9

## 2017-07-24 HISTORY — DX: Gastro-esophageal reflux disease without esophagitis: K21.9

## 2017-07-24 HISTORY — DX: Gastroparesis: K31.84

## 2017-07-24 LAB — URINALYSIS, ROUTINE W REFLEX MICROSCOPIC
BILIRUBIN URINE: NEGATIVE
GLUCOSE, UA: NEGATIVE mg/dL
Hgb urine dipstick: NEGATIVE
KETONES UR: NEGATIVE mg/dL
NITRITE: NEGATIVE
PH: 6 (ref 5.0–8.0)
Protein, ur: NEGATIVE mg/dL
Specific Gravity, Urine: 1.011 (ref 1.005–1.030)

## 2017-07-24 LAB — PREGNANCY, URINE: Preg Test, Ur: NEGATIVE

## 2017-07-24 MED ORDER — METHOCARBAMOL 500 MG PO TABS: 1000.0000 mg | ORAL_TABLET | Freq: Four times a day (QID) | ORAL | 0 refills | Status: DC | PRN

## 2017-07-24 NOTE — ED Provider Notes (Signed)
[facility] EMERGENCY DEPARTMENT Provider Note   CSN: [id number] Arrival date & time: 07/24/17  1332     History   Chief Complaint Chief Complaint  Patient presents with  . Back Pain    HPI Trinda Harlacher is a 47 y.o. female.  HPI  Pt was seen at 1640. Per pt, c/o sudden onset and persistence of constant left sided low back "pain" that began 4 weeks ago. Pt describes the pain as "throbbing." Pain began to radiate into the left side of her abd yesterday.  Has been associated with no other symptoms.  Denies vaginal bleeding/discharge, no dysuria/hematuria, no abd pain, no N/V, no diarrhea, no black or blood in stools, no CP/SOB, no fevers, no rash.    Past Medical History:  Diagnosis Date  . Anxiety   . BMI (body mass index) 20.0-29.01 Apr 2010 172 LBS  . Chronic back pain   . Degenerative disc disease, thoracic   . Gastroparesis   . GERD (gastroesophageal reflux disease)   . Headache(784.0)   . IBS (irritable bowel syndrome)   . Incontinence of feces    WEARS DEPENDS AT HS    . Neuropathy   . PONV (postoperative nausea and vomiting)   . Pseudoobstruction of colon 08/24/2014  . Seasonal allergies   . Serrated adenoma of colon DEC 2011    Patient Active Problem List   Diagnosis Date Noted  . Lumbar pseudoarthrosis 09/03/2016  . Dysphagia 05/31/2015  . ARF (acute renal failure) ([place]) 09/03/2014  . UTI (urinary tract infection) 09/03/2014  . Gastroparesis 07/15/2014  . GERD (gastroesophageal reflux disease) 06/29/2014  . Colon adenoma 01/17/2011  . Irritable bowel syndrome 04/12/2010    Past Surgical History:  Procedure Laterality Date  . ABLATION     endometrial ablation  . APPENDECTOMY    . BACK SURGERY  05/14/2011   Lumbar Fusion and cage  . BIOPSY  06/22/2015   Procedure: BIOPSY;  Surgeon: Danie Binder, MD;  Location: [facility];  Service: Endoscopy;;  gastric and esophageal bx's  . BREAST LUMPECTOMY     from the left breast 2003,  2016  . COLECTOMY  N/A 08/24/2014   Procedure: TOTAL COLECTOMY;  Surgeon: Jamesetta So, MD;  Location: [facility];  Service: General;  Laterality: N/A;  . COLONOSCOPY  NOV 2011 SCREENING    HYPERPLASTIC RECTAL POLYP, SML IH, incomplete due to discomfort  . COLONOSCOPY  DEC 2011 w/ PROPOFOL   SERRATED ADENOMA, HYPERPLASTIC POLY[S[  . ESOPHAGEAL DILATION    . ESOPHAGOGASTRODUODENOSCOPY (EGD) WITH PROPOFOL N/A 06/22/2015   Procedure: ESOPHAGOGASTRODUODENOSCOPY (EGD) WITH PROPOFOL;  Surgeon: Danie Binder, MD;  Location: [facility];  Service: Endoscopy;  Laterality: N/A;  . ESOPHAGOGASTRODUODENOSCOPY (EGD) WITH PROPOFOL N/A 01/21/2017   Procedure: ESOPHAGOGASTRODUODENOSCOPY (EGD) WITH PROPOFOL;  Surgeon: Danie Binder, MD;  [facility]: [facility];  Service: Endoscopy;  Laterality: N/A;  9:30am  . FLEXIBLE SIGMOIDOSCOPY N/A 06/22/2015   Procedure: FLEXIBLE SIGMOIDOSCOPY WITH PROPOFOL;  Surgeon: Danie Binder, MD;  [facility]: [facility];  Service: Endoscopy;  Laterality: N/A;  0830   . HARDWARE REMOVAL N/A 09/03/2016   Procedure: Removal of Lumbar five-Sacrum one  hardware with Metrex;  Surgeon: Kristeen Miss, MD;  Location: [place];  Service: Neurosurgery;  Laterality: N/A;  . KNEE SURGERY Bilateral    2 on righ-1 scope and one open; and one on left  . MOUTH SURGERY  07/21/2013  . OVARIAN CYST REMOVAL    . SAVORY DILATION  N/A 01/21/2017   Procedure: SAVORY DILATION;  Surgeon: Danie Binder, MD;  [facility];  Service: Endoscopy;  Laterality: N/A;  . SMART PILL PROCEDURE N/A 07/04/2014   Procedure: SMART PILL PROCEDURE;  Surgeon: Danie Binder, MD;  Location[facility];  Service: Endoscopy;  Laterality: N/A;  800  . TUBAL LIGATION      OB History    No data available       Home Medications    Prior to Admission medications   Medication Sig Start Date End Date Taking? Authorizing Provider  cyclobenzaprine (FLEXERIL) 10 MG tablet Take 10 mg by mouth 3 (three) times daily. 12/24/16  Yes  [provider]  fluticasone (FLONASE) 50 MCG/ACT nasal spray Place 1 spray into both nostrils 2 (two) times daily.   Yes [provider]  furosemide (LASIX) 20 MG tablet Take 20 mg by mouth 2 (two) times a week. Mondays and Fridays   Yes [provider]  gabapentin (NEURONTIN) 300 MG capsule Take 300 mg by mouth 3 (three) times daily.   Yes [provider]  LINZESS 290 MCG CAPS capsule TAKE 1 CAPSULE BY MOUTH EVERY DAY 30 MINUTES PRIOR TO BREAKFAST 09/12/16  Yes Mahala Menghini, PA-C  loratadine (CLARITIN) 10 MG tablet Take 10 mg by mouth daily.     Yes [provider]  meloxicam (MOBIC) 15 MG tablet Take 15 mg by mouth daily.   Yes [provider]  Olopatadine HCl (PAZEO) 0.7 % SOLN Place 1 drop into both eyes daily.    Yes [provider]  omeprazole (PRILOSEC) 20 MG capsule Take 1 capsule (20 mg total) by mouth daily. 09/08/14  Yes Aviva Signs, MD  ondansetron (ZOFRAN) 4 MG tablet Take 4 mg by mouth every 4 (four) hours as needed for nausea or vomiting.   Yes [provider]  oxyCODONE (OXY IR/ROXICODONE) 5 MG immediate release tablet Take 5 mg by mouth 2 (two) times daily.    Yes [provider]  SUMAtriptan (IMITREX) 100 MG tablet Take 100 mg by mouth every 2 (two) hours as needed for migraine or headache. May repeat in 2 hours if headache persists or recurs.   Yes [provider]  topiramate (TOPAMAX) 50 MG tablet Take 50 mg by mouth 2 (two) times daily.    Yes [provider]  Wheat Dextrin (BENEFIBER DRINK MIX PO) Take 15 mLs by mouth 2 (two) times daily. Mixed with water. Healthy Weight   Yes [provider]    Family History Family History  Problem Relation Age of Onset  . Breast cancer Maternal Aunt   . Colon cancer Neg Hx   . Colon polyps Neg Hx     Social History Social History   Tobacco Use  . Smoking status: Current Every Day Smoker    Packs/day: 1.50    Years:  36.00    Pack years: 54.00    Types: Cigarettes  . Smokeless tobacco: Never Used  Substance Use Topics  . Alcohol use: Yes    Comment: once a year when they go to the beach  . Drug use: No     Allergies   Aspirin; Bee venom; Codeine; and Penicillins   Review of Systems Review of Systems ROS: Statement: All systems negative except as marked or noted in the HPI; Constitutional: Negative for fever and chills. ; ; Eyes: Negative for eye pain, redness and discharge. ; ; ENMT: Negative for ear pain, hoarseness, nasal  congestion, sinus pressure and sore throat. ; ; Cardiovascular: Negative for chest pain, palpitations, diaphoresis, dyspnea and peripheral edema. ; ; Respiratory: Negative for cough, wheezing and stridor. ; ; Gastrointestinal: Negative for nausea, vomiting, diarrhea, abdominal pain, blood in stool, hematemesis, jaundice and rectal bleeding. . ; ; Genitourinary: Negative for dysuria, flank pain and hematuria. ; ; GYN:  No pelvic pain, no vaginal bleeding, no vaginal discharge, no vulvar pain. ;; Musculoskeletal: +LBP. Negative for neck pain. Negative for swelling and trauma.; ; Skin: Negative for pruritus, rash, abrasions, blisters, bruising and skin lesion.; ; Neuro: Negative for headache, lightheadedness and neck stiffness. Negative for weakness, altered level of consciousness, altered mental status, extremity weakness, paresthesias, involuntary movement, seizure and syncope.       Physical Exam Updated Vital Signs BP (!) 142/100   Pulse 100   Temp 98.1 F (36.7 C) (Oral)   Resp 16   Ht 5\' 3"  (1.6 m)   Wt 70.8 kg (156 lb)   LMP 07/22/2011 Comment: tubal ligation/ablation  SpO2 100%   BMI 27.63 kg/m   Physical Exam 1645: Physical examination:  Nursing notes reviewed; Vital signs and O2 SAT reviewed;  Constitutional: Well developed, Well nourished, Well hydrated, In no acute distress; Head:  Normocephalic, atraumatic; Eyes: EOMI, PERRL, No scleral icterus; ENMT: Mouth and  pharynx normal, Mucous membranes moist; Neck: Supple, Full range of motion, No lymphadenopathy; Cardiovascular: Regular rate and rhythm, No gallop; Respiratory: Breath sounds clear & equal bilaterally, No wheezes.  Speaking full sentences with ease, Normal respiratory effort/excursion; Chest: Nontender, Movement normal; Abdomen: Soft, Nontender, Nondistended, Normal bowel sounds; Genitourinary: No CVA tenderness; Spine:  No midline CS, TS, LS tenderness. +TTP left lower lumbar paraspinal muscles. No rash. +well healed midline LS surgical scar.;; Extremities: Pulses normal, No tenderness, No edema, No calf edema or asymmetry.; Neuro: AA&Ox3, Major CN grossly intact.  Speech clear. No gross focal motor or sensory deficits in extremities. Climbs on and off stretcher easily by herself. Gait steady..; Skin: Color normal, Warm, Dry.   ED Treatments / Results  Labs (all labs ordered are listed, but only abnormal results are displayed)   EKG  EKG Interpretation None       Radiology   Procedures Procedures (including critical care time)  Medications Ordered in ED Medications - No data to display   Initial Impression / Assessment and Plan / ED Course  I have reviewed the triage vital signs and the nursing notes.  Pertinent labs & imaging results that were available during my care of the patient were reviewed by me and considered in my medical decision making (see chart for details).  MDM Reviewed: previous chart, nursing note and vitals Interpretation: CT scan and labs   Results for orders placed or performed during the hospital encounter of 07/24/17  Pregnancy, urine  Result Value Ref Range   Preg Test, Ur NEGATIVE NEGATIVE  Urinalysis, Routine w reflex microscopic  Result Value Ref Range   Color, Urine YELLOW YELLOW   APPearance CLEAR CLEAR   Specific Gravity, Urine 1.011 1.005 - 1.030   pH 6.0 5.0 - 8.0   Glucose, UA NEGATIVE NEGATIVE mg/dL   Hgb urine dipstick NEGATIVE  NEGATIVE   Bilirubin Urine NEGATIVE NEGATIVE   Ketones, ur NEGATIVE NEGATIVE mg/dL   Protein, ur NEGATIVE NEGATIVE mg/dL   Nitrite NEGATIVE NEGATIVE   Leukocytes, UA SMALL (A) NEGATIVE   RBC / HPF 0-5 0 - 5 RBC/hpf   WBC, UA 6-30 0 - 5 WBC/hpf  Bacteria, UA RARE (A) NONE SEEN   Squamous Epithelial / LPF 0-5 (A) NONE SEEN   Mucus PRESENT    Ct Renal Stone Study Result Date: 07/24/2017 CLINICAL DATA:  Left sided back pain.  History of colectomy. EXAM: CT ABDOMEN AND PELVIS WITHOUT CONTRAST TECHNIQUE: Multidetector CT imaging of the abdomen and pelvis was performed following the standard protocol without IV contrast. COMPARISON:  September 03, 2014 CT the abdomen and pelvis. CT of the lumbar spine February 13, 2017. FINDINGS: Lower chest: No acute abnormality. Hepatobiliary: No focal liver abnormality is seen. No gallstones, gallbladder wall thickening, or biliary dilatation. Pancreas: Unremarkable. No pancreatic ductal dilatation or surrounding inflammatory changes. Spleen: Normal in size without focal abnormality. Adrenals/Urinary Tract: Adrenal glands are unremarkable. Kidneys are normal, without renal calculi, focal lesion, or hydronephrosis. Bladder is unremarkable. Stomach/Bowel: The stomach is normal. The patient is status post colectomy. The anastomosis is seen in the right lower quadrant. A few mildly prominent air-filled loops of small bowel are seen in the right upper quadrant interposed between the liver and diaphragm. Based on the lateral views from the previous CT scans of the lumbar spine, I suspect these mildly prominent loops are stable and not an acute finding. The remainder of the bowel is normal in appearance. Vascular/Lymphatic: No significant vascular findings are present. No enlarged abdominal or pelvic lymph nodes. Reproductive: Uterus and bilateral adnexa are unremarkable. Other: No abdominal wall hernia or abnormality. No abdominopelvic ascites. Musculoskeletal: A disc spacer device is  seen at L5-S1. No acute abnormalities are identified in the lower thoracic or lumbar spine. IMPRESSION: 1. No renal stones or obstruction. No cause for the patient's symptoms identified. 2. Mildly prominent air-filled loops of bowel in the right upper quadrant are likely nonacute finding in this patient. No evidence of significant obstruction on today's study. Electronically Signed   By: Dorise Bullion III M.D   On: 07/24/2017 18:06    1815:  Workup reassuring. Tx symptomatically at this time. [place] PMP Database accessed: pt filled oxycodone 5mg  tabs, #60, on 07/03/2017.  Will not rx any further narcotics. Dx and testing d/w pt.  Questions answered.  Verb understanding, agreeable to d/c home with outpt f/u.   Final Clinical Impressions(s) / ED Diagnoses   Final diagnoses:  None    ED Discharge Orders    None       Francine Graven, DO 07/26/17 1113

## 2017-07-24 NOTE — Discharge Instructions (Signed)
Take the prescription as directed.  Apply moist heat or ice to the area(s) of discomfort, for 15 minutes at a time, several times per day for the next few days.  Do not fall asleep on a heating or ice pack.  Call your regular medical doctor tomorrow to schedule a follow up appointment within the next.  Return to the Emergency Department immediately if worsening.

## 2017-07-24 NOTE — ED Triage Notes (Signed)
Pt c/o lower left sided back pain x 4 weeks and pain started radiating to left lower abdomen last night.

## 2017-09-04 ENCOUNTER — Other Ambulatory Visit: Payer: Self-pay | Admitting: Gastroenterology

## 2017-09-09 ENCOUNTER — Encounter: Payer: Self-pay | Admitting: Nurse Practitioner

## 2017-09-09 ENCOUNTER — Ambulatory Visit: Payer: Medicaid Other | Admitting: Nurse Practitioner

## 2017-09-09 VITALS — BP 117/82 | HR 89 | Temp 97.1°F | Ht 63.0 in | Wt 161.8 lb

## 2017-09-09 DIAGNOSIS — K219 Gastro-esophageal reflux disease without esophagitis: Secondary | ICD-10-CM | POA: Diagnosis not present

## 2017-09-09 DIAGNOSIS — K5900 Constipation, unspecified: Secondary | ICD-10-CM | POA: Diagnosis not present

## 2017-09-09 DIAGNOSIS — R1310 Dysphagia, unspecified: Secondary | ICD-10-CM

## 2017-09-09 MED ORDER — OMEPRAZOLE 20 MG PO CPDR: 20.0000 mg | DELAYED_RELEASE_CAPSULE | Freq: Two times a day (BID) | ORAL | 2 refills | Status: DC

## 2017-09-09 NOTE — Assessment & Plan Note (Signed)
GERD not well controlled at this time.  She is on Prilosec 20 mg daily.  She has regular breakthrough symptoms about daily.  At this point I will increase her Prilosec to twice a day to see if this helps.  Follow-up in 2 months.  If no improvement then we can consider changing to another regimen such as Protonix or Dexilant.

## 2017-09-09 NOTE — Progress Notes (Signed)
CC'D TO PCP °

## 2017-09-09 NOTE — Patient Instructions (Addendum)
1. Hold your Linzess for now. 2. Start taking Movantik 25 mg once a day. 3. Call us in 1-2 weeks and let us know if your Movantik is helping you have better bowel movements. 4. I have sent a new prescription to your pharmacy to increase her Prilosec to twice a day, 30 minutes before meal. 5. When you call us in 1-2 weeks, let us know if this is helping your reflux/heartburn symptoms. 6. I am giving you further information related to a gastroparesis diet below. 7. Follow-up in 2 months. 8. Call us if you have any questions or concerns.   Enjoy the [place]!!    At [facility] we value your feedback. You may receive a survey about your visit today. Please share your experience as we strive to create trusing relationships with our patients to provide genuine, compassionate, quality care.      Gastroparesis Gastroparesis, also called delayed gastric emptying, is a condition in which food takes longer than normal to empty from the stomach. The condition is usually long-lasting (chronic). What are the causes? This condition may be caused by:  An endocrine disorder, such as hypothyroidism or diabetes. Diabetes is the most common cause of this condition.  A nervous system disease, such as Parkinson disease or multiple sclerosis.  Cancer, infection, or surgery of the stomach or vagus nerve.  A connective tissue disorder, such as scleroderma.  Certain medicines.  In most cases, the cause is not known. What increases the risk? This condition is more likely to develop in:  People with certain disorders, including endocrine disorders, eating disorders, amyloidosis, and scleroderma.  People with certain diseases, including Parkinson disease or multiple sclerosis.  People with cancer or infection of the stomach or vagus nerve.  People who have had surgery on the stomach or vagus nerve.  People who take certain medicines.  Women.  What are the signs or  symptoms? Symptoms of this condition include:  An early feeling of fullness when eating.  Nausea.  Weight loss.  Vomiting.  Heartburn.  Abdominal bloating.  Inconsistent blood glucose levels.  Lack of appetite.  Acid from the stomach coming up into the esophagus (gastroesophageal reflux).  Spasms of the stomach.  Symptoms may come and go. How is this diagnosed? This condition is diagnosed with tests, such as:  Tests that check how long it takes food to move through the stomach and intestines. These tests include: ? Upper gastrointestinal (GI) series. In this test, X-rays of the intestines are taken after you drink a liquid. The liquid makes the intestines show up better on the X-rays. ? Gastric emptying scintigraphy. In this test, scans are taken after you eat food that contains a small amount of radioactive material. ? Wireless capsule GI monitoring system. This test involves swallowing a capsule that records information about movement through the stomach.  Gastric manometry. This test measures electrical and muscular activity in the stomach. It is done with a thin tube that is passed down the throat and into the stomach.  Endoscopy. This test checks for abnormalities in the lining of the stomach. It is done with a long, thin tube that is passed down the throat and into the stomach.  An ultrasound. This test can help rule out gallbladder disease or pancreatitis as a cause of your symptoms. It uses sound waves to take pictures of the inside of your body.  How is this treated? There is no cure for gastroparesis. This condition may be managed with:  Treatment of  the underlying condition causing the gastroparesis.  Lifestyle changes, including exercise and dietary changes. Dietary changes can include: ? Changes in what and when you eat. ? Eating smaller meals more often. ? Eating low-fat foods. ? Eating low-fiber forms of high-fiber foods, such as cooked vegetables instead  of raw vegetables. ? Having liquid foods in place of solid foods. Liquid foods are easier to digest.  Medicines. These may be given to control nausea and vomiting and to stimulate stomach muscles.  Getting food through a feeding tube. This may be done in severe cases.  A gastric neurostimulator. This is a device that is inserted into the body with surgery. It helps improve stomach emptying and control nausea and vomiting.  Follow these instructions at home:  Follow your health care provider's instructions about exercise and diet.  Take medicines only as directed by your health care provider. Contact a health care provider if:  Your symptoms do not improve with treatment.  You have new symptoms. Get help right away if:  You have severe abdominal pain that does not improve with treatment.  You have nausea that does not go away.  You cannot keep fluids down. This information is not intended to replace advice given to you by your health care provider. Make sure you discuss any questions you have with your health care provider. Document Released: 06/10/2005 Document Revised: 11/16/2015 Document Reviewed: 06/06/2014 Elsevier Interactive Patient Education  [employer].

## 2017-09-09 NOTE — Assessment & Plan Note (Signed)
The patient indicates her constipation is not well controlled.  When she has a bowel movement she will have a hard stool and subsequent stools are soft.  She has to go 3 times a day in order to empty out completely.  She is on max dose Linzess, Benefiber fiber supplement, and intermittent stool softener with stimulant laxative.  At this point she states she is previously tried Actuary.  It does not appear she is tried Best boy.  I will have her hold Linzess for now and start Movantik 25 mg daily as she is on regular pain medication.  She is to call us with a progress report in 1-2 weeks.  Follow-up in 2 months.

## 2017-09-09 NOTE — Progress Notes (Signed)
Referring Provider: Monico Blitz, MD Primary Care Physician:  Monico Blitz, MD Primary GI:  Dr. Oneida Alar  Chief Complaint  Patient presents with  . Dysphagia    f/u.  Marland Kitchen Abdominal Pain    RUQ x few months  . Gas    feels it is trapped in abdomen  . belching    HPI:   Alicia Tyler is a 47 y.o. female who presents follow-up on dysphasia, abdominal pain, gas, belching.  The patient was last seen in our office 12/26/2016 for dysphasia, GERD, gastroparesis.  At that time she was having significant solid food dysphagia.  Bowel movements variable hard to lose.  Nausea with migraines.  On Linzess and occasional stimulant laxative as needed.  Commended barium swallow, gastroparesis diet, continue omeprazole, follow-up in 6 months.  Barium pill esophagram completed 12/31/2016 which found esophageal distention, narrowing at the gastroesophageal junction, barium tablet obstructed at the GE junction and could not pass despite multiple swallows of water.  Past after dissolution.  Recommended EGD with possible dilation.  EGD completed 01/21/2017 which found mild benign-appearing esophageal stenosis status post dilation.  Recommended call in 1 month if symptoms not improved at which point the patient would need esophageal manometry.  Follow-up in 6 months.  Today she states she's doing "so-so." Having a lot of gas, abdominal rumbling. Has some GERD symptoms including esophageal burning. GERD symptoms a couple times a day, frequent belching and burning. Dysphagia symptoms doing pretty good; rare "thick" when swallowing but "not too bad." Has "sometimes" abdominal pain about 3 times a week which improves after a bowel movement. Has a bowel movement about 3 times a day "to get it all out" and stools initially hard/droplets, then soft afterward. Has had a subtotal colectomy 2016. Still on Linzess and won't go if she doesn't take it; also stool softener with stimulant laxative every 2-3 days. Also fiber supplement.  On Mobic due to DDD. Denies hematochezia, melena, fever, chills, unintentional weight loss. Denies chest pain, dyspnea, dizziness, lightheadedness, syncope, near syncope. Denies any other upper or lower GI symptoms.  Past Medical History:  Diagnosis Date  . Anxiety   . BMI (body mass index) 20.0-29.01 Apr 2010 172 LBS  . Chronic back pain   . Degenerative disc disease, thoracic   . Gastroparesis   . GERD (gastroesophageal reflux disease)   . Headache(784.0)   . IBS (irritable bowel syndrome)   . Incontinence of feces    WEARS DEPENDS AT HS    . Neuropathy   . PONV (postoperative nausea and vomiting)   . Pseudoobstruction of colon 08/24/2014  . Seasonal allergies   . Serrated adenoma of colon DEC 2011    Past Surgical History:  Procedure Laterality Date  . ABLATION     endometrial ablation  . APPENDECTOMY    . BACK SURGERY  05/14/2011   Lumbar Fusion and cage  . BIOPSY  06/22/2015   Procedure: BIOPSY;  Surgeon: Danie Binder, MD;  [facility];  Service: Endoscopy;;  gastric and esophageal bx's  . BREAST LUMPECTOMY     from the left breast 2003,  2016  . COLECTOMY N/A 08/24/2014   Procedure: TOTAL COLECTOMY;  Surgeon: Jamesetta So, MD;  Location: [facility];  Service: General;  Laterality: N/A;  . COLONOSCOPY  NOV 2011 SCREENING    HYPERPLASTIC RECTAL POLYP, SML IH, incomplete due to discomfort  . COLONOSCOPY  DEC 2011 w/ PROPOFOL   SERRATED ADENOMA, HYPERPLASTIC POLY[S[  . ESOPHAGEAL  DILATION    . ESOPHAGOGASTRODUODENOSCOPY (EGD) WITH PROPOFOL N/A 06/22/2015   Procedure: ESOPHAGOGASTRODUODENOSCOPY (EGD) WITH PROPOFOL;  Surgeon: Danie Binder, MD;  [facility];  Service: Endoscopy;  Laterality: N/A;  . ESOPHAGOGASTRODUODENOSCOPY (EGD) WITH PROPOFOL N/A 01/21/2017   Procedure: ESOPHAGOGASTRODUODENOSCOPY (EGD) WITH PROPOFOL;  Surgeon: Danie Binder, MD;  [facility];  Service: Endoscopy;  Laterality: N/A;  9:30am  . FLEXIBLE SIGMOIDOSCOPY N/A  06/22/2015   Procedure: FLEXIBLE SIGMOIDOSCOPY WITH PROPOFOL;  Surgeon: Danie Binder, MD;  [facility];  Service: Endoscopy;  Laterality: N/A;  0830   . HARDWARE REMOVAL N/A 09/03/2016   Procedure: Removal of Lumbar five-Sacrum one  hardware with Metrex;  Surgeon: Kristeen Miss, MD;  Location: [facility];  Service: Neurosurgery;  Laterality: N/A;  . KNEE SURGERY Bilateral    2 on righ-1 scope and one open; and one on left  . MOUTH SURGERY  07/21/2013  . OVARIAN CYST REMOVAL    . SAVORY DILATION N/A 01/21/2017   Procedure: SAVORY DILATION;  Surgeon: Danie Binder, MD;  [facility];  Service: Endoscopy;  Laterality: N/A;  . SMART PILL PROCEDURE N/A 07/04/2014   Procedure: SMART PILL PROCEDURE;  Surgeon: Danie Binder, MD;  [facility];  Service: Endoscopy;  Laterality: N/A;  800  . TUBAL LIGATION      Current Outpatient Medications  Medication Sig Dispense Refill  . cyclobenzaprine (FLEXERIL) 10 MG tablet Take 10 mg by mouth 3 (three) times daily.  1  . fluticasone (FLONASE) 50 MCG/ACT nasal spray Place 1 spray into both nostrils 2 (two) times daily.    . furosemide (LASIX) 20 MG tablet Take 20 mg by mouth 2 (two) times a week. Mondays and Fridays    . gabapentin (NEURONTIN) 300 MG capsule Take 300 mg by mouth 3 (three) times daily.    Marland Kitchen LINZESS 290 MCG CAPS capsule TAKE 1 CAPSULE BY MOUTH EVERY DAY 30 MINUTES PRIOR TO BREAKFAST 30 capsule 11  . loratadine (CLARITIN) 10 MG tablet Take 10 mg by mouth daily.      . meloxicam (MOBIC) 15 MG tablet Take 15 mg by mouth daily.    . Olopatadine HCl (PAZEO) 0.7 % SOLN Place 1 drop into both eyes daily.     Marland Kitchen omeprazole (PRILOSEC) 20 MG capsule Take 1 capsule (20 mg total) by mouth daily. 60 capsule 1  . ondansetron (ZOFRAN) 4 MG tablet Take 4 mg by mouth every 4 (four) hours as needed for nausea or vomiting.    Marland Kitchen oxyCODONE (OXY IR/ROXICODONE) 5 MG immediate release tablet Take 5 mg by mouth 2 (two) times daily.     .  SUMAtriptan (IMITREX) 100 MG tablet Take 100 mg by mouth every 2 (two) hours as needed for migraine or headache. May repeat in 2 hours if headache persists or recurs.    . topiramate (TOPAMAX) 50 MG tablet Take 50 mg by mouth 2 (two) times daily.     . Wheat Dextrin (BENEFIBER DRINK MIX PO) Take 15 mLs by mouth 2 (two) times daily. Mixed with water. Alternates with fiber gummies     No current facility-administered medications for this visit.     Allergies as of 09/09/2017 - Review Complete 09/09/2017  Allergen Reaction Noted  . Aspirin Shortness Of Breath, Swelling, and Rash   . Bee venom Anaphylaxis 07/08/2013  . Codeine Nausea And Vomiting and Rash   . Penicillins Other (See Comments)  Family History  Problem Relation Age of Onset  . Breast cancer Maternal Aunt   . Colon cancer Neg Hx   . Colon polyps Neg Hx     Social History   Socioeconomic History  . Marital status: Married    Spouse name: None  . Number of children: None  . Years of education: None  . Highest education level: None  Social Needs  . Financial resource strain: None  . Food insecurity - worry: None  . Food insecurity - inability: None  . Transportation needs - medical: None  . Transportation needs - non-medical: None  Occupational History  . None  Tobacco Use  . Smoking status: Current Every Day Smoker    Packs/day: 1.50    Years: 36.00    Pack years: 54.00    Types: Cigarettes  . Smokeless tobacco: Never Used  Substance and Sexual Activity  . Alcohol use: Yes    Comment: once a year when they go to the beach  . Drug use: No  . Sexual activity: Yes    Birth control/protection: Surgical, Post-menopausal    Comment: tubal  Other Topics Concern  . None  Social History Narrative   DAUGHTER IS SEEING IMPAIRED. LEARNING TO PLAY VIOLIN.    Review of Systems: Complete ROS negative except as per HPI.   Physical Exam: BP 117/82   Pulse 89   Temp (!) 97.1 F (36.2 C) (Oral)   Ht 5\' 3"   (1.6 m)   Wt 161 lb 12.8 oz (73.4 kg)   LMP 07/22/2011 Comment: tubal ligation/ablation  BMI 28.66 kg/m  General:   Alert and oriented. Pleasant and cooperative. Well-nourished and well-developed.  Eyes:  Without icterus, sclera clear and conjunctiva pink.  Ears:  Normal auditory acuity. Cardiovascular:  S1, S2 present without murmurs appreciated. Extremities without clubbing or edema. Respiratory:  Clear to auscultation bilaterally. No wheezes, rales, or rhonchi. No distress.  Gastrointestinal:  +BS, rounded but soft and non-distended. Mild epigastric TTP noted. No HSM noted. No guarding or rebound. No masses appreciated.  Rectal:  Deferred  Musculoskalatal:  Symmetrical without gross deformities. Neurologic:  Alert and oriented x4;  grossly normal neurologically. Psych:  Alert and cooperative. Normal mood and affect. Heme/Lymph/Immune: No excessive bruising noted.    09/09/2017 10:50 AM   Disclaimer: This note was dictated with voice recognition software. Similar sounding words can inadvertently be transcribed and may not be corrected upon review.

## 2017-09-09 NOTE — Assessment & Plan Note (Signed)
Significantly improved after EGD.  Continue to monitor.  Follow-up in 2 months.

## 2017-09-10 ENCOUNTER — Other Ambulatory Visit (HOSPITAL_COMMUNITY): Payer: Self-pay | Admitting: Neurological Surgery

## 2017-09-10 DIAGNOSIS — M5416 Radiculopathy, lumbar region: Secondary | ICD-10-CM

## 2017-09-17 ENCOUNTER — Ambulatory Visit (HOSPITAL_COMMUNITY)
Admission: RE | Admit: 2017-09-17 | Discharge: 2017-09-17 | Disposition: A | Discharge status: Home / Self Care | Payer: Medicaid Other | Source: Ambulatory Visit | Attending: Neurological Surgery | Admitting: Neurological Surgery

## 2017-09-17 DIAGNOSIS — M4327 Fusion of spine, lumbosacral region: Secondary | ICD-10-CM | POA: Diagnosis not present

## 2017-09-17 DIAGNOSIS — M5136 Other intervertebral disc degeneration, lumbar region: Secondary | ICD-10-CM | POA: Insufficient documentation

## 2017-09-17 DIAGNOSIS — M5416 Radiculopathy, lumbar region: Secondary | ICD-10-CM | POA: Diagnosis present

## 2017-09-17 DIAGNOSIS — M4316 Spondylolisthesis, lumbar region: Secondary | ICD-10-CM | POA: Diagnosis not present

## 2017-09-17 MED ORDER — GADOBENATE DIMEGLUMINE 529 MG/ML IV SOLN
15.0000 mL | Freq: Once | INTRAVENOUS | Status: AC | PRN
  Administered 2017-09-17: 15 mL via INTRAVENOUS

## 2017-09-25 ENCOUNTER — Encounter: Payer: Self-pay | Admitting: Gastroenterology

## 2017-09-25 ENCOUNTER — Telehealth: Payer: Self-pay | Admitting: Gastroenterology

## 2017-09-25 NOTE — Telephone Encounter (Signed)
Tried to call. Many rings and no answer. PT was started on Movantik at office visit and was told to stop Linzess.

## 2017-09-25 NOTE — Telephone Encounter (Signed)
PATIENT CALLED AND LEFT MESSAGE THAT SAID THE SAMPLES SHE TRIED DID NOT WORK AND SHE STARTED TAKING THE OTHER MEDICINE SHE WAS ON BEFORE.

## 2017-09-26 NOTE — Telephone Encounter (Signed)
PT said the Movantik did not work at all. She went back on the Linzess 290 mcg an it is working good.  She has a prescription and refills.  Just sending FYI to Walden Field, NP.

## 2017-10-07 NOTE — Telephone Encounter (Signed)
Noted  

## 2017-11-13 ENCOUNTER — Ambulatory Visit: Payer: Medicaid Other | Admitting: Nurse Practitioner

## 2017-12-12 ENCOUNTER — Other Ambulatory Visit: Payer: Self-pay | Admitting: Nurse Practitioner

## 2017-12-12 DIAGNOSIS — R1310 Dysphagia, unspecified: Secondary | ICD-10-CM

## 2017-12-12 DIAGNOSIS — K219 Gastro-esophageal reflux disease without esophagitis: Secondary | ICD-10-CM

## 2017-12-12 DIAGNOSIS — K5900 Constipation, unspecified: Secondary | ICD-10-CM

## 2017-12-17 ENCOUNTER — Ambulatory Visit: Payer: Medicaid Other | Admitting: Nurse Practitioner

## 2017-12-17 ENCOUNTER — Encounter: Payer: Self-pay | Admitting: Nurse Practitioner

## 2017-12-17 ENCOUNTER — Other Ambulatory Visit: Payer: Self-pay

## 2017-12-17 VITALS — BP 121/85 | HR 91 | Temp 98.1°F | Ht 62.0 in | Wt 153.4 lb

## 2017-12-17 DIAGNOSIS — K219 Gastro-esophageal reflux disease without esophagitis: Secondary | ICD-10-CM | POA: Diagnosis not present

## 2017-12-17 DIAGNOSIS — K5900 Constipation, unspecified: Secondary | ICD-10-CM

## 2017-12-17 DIAGNOSIS — R1310 Dysphagia, unspecified: Secondary | ICD-10-CM

## 2017-12-17 DIAGNOSIS — R140 Abdominal distension (gaseous): Secondary | ICD-10-CM | POA: Insufficient documentation

## 2017-12-17 NOTE — Assessment & Plan Note (Signed)
Constipation is improved.  Some persistent constipation but she is satisfied with her results on Linzess.  Recommend she continue Linzess.  No red flag/warning signs or symptoms.  Follow-up in 3 months.

## 2017-12-17 NOTE — Patient Instructions (Signed)
1. Continue your current medications. 2. I am giving you samples of a probiotic.  Take 1 a day.  If this helps her bloating you can purchase additional probiotics over-the-counter.  You can get the advice of the pharmacist if needed. 3. We will refer you to the GI practice in [facility] for manometry testing, as we discussed. 4. Return for follow-up in 3 months. 5. Call us if you have any questions or concerns.  At [facility] we value your feedback. You may receive a survey about your visit today. Please share your experience as we strive to create trusting relationships with our patients to provide genuine, compassionate, quality care.  It was great to see you today!  I hope you have a wonderful summer!!

## 2017-12-17 NOTE — Progress Notes (Signed)
Referring Provider: Monico Blitz, MD Primary Care Physician:  Monico Blitz, MD Primary GI:  Dr. Oneida Alar  Chief Complaint  Patient presents with  . Dysphagia  . Gastroesophageal Reflux    "stomach burns"  . Constipation    occ    HPI:   Alicia Tyler is a 47 y.o. female who presents for dysphasia, GERD, constipation.  The patient was last seen in our office 09/09/2017 for the same.  Apparent history of GERD and dysphasia.  History of variable bowel movements hard to lose.  BP E completed 12/31/2016 which found esophageal distention, narrowing at the GE junction, barium tablet obstructed at the GE junction and could not pass despite multiple swallows of water.  EGD was recommended.  EGD completed 01/21/2017 which found esophageal stenosis status post dilation and if no improvement in symptoms recommend esophageal manometry.  At her last visit she was doing "so-so."  A lot of gas, GERD symptoms including esophageal burning couple times a day with frequent belching.  Dysphasia symptoms doing pretty good, rare "thick" when swallowing but not too bad.  Intermittent abdominal pain 3 times a week which improves after bowel movement.  Has a bowel movement 3 times a day to "get it all out" and stools are initially hard/droplets and then soften afterward.  Has had a subtotal colectomy in 2016.  Still on Linzess and will go she does not take it, occasionally uses stool softener or stimulant laxative.  Fiber supplement on board.  No other GI symptoms.  Recommended hold Linzess, start Movantik 25 mg daily, call in 1 to 2 weeks with a progress report.  Start Prilosec twice a day, progress report in 1 to 2 weeks.  Follow-up in 2 months.  Gastroparesis diet education given.  Patient called 2 weeks after her visit stating Movantik did not work and she went back to [employer] 290 mcg which is working well.  Today she states she's doing ok overall. GERD improved having stomach burning about 2 times a week, is  satisfied with this. Constipation doing ok, occasional constipation but not often. Will use intermittent OTC laxative prn. Has gas "sounds" and bloating. Has minimal dysphagia in the mornings with taking medications. No solid food dysphagia but avoids trigger foods and cuts foods into small pieces. Has thick mucusy material that tries to 'come up" when she spits after brushing her teeth. Denies hematochezia, melena, fever, chills, unintentional weight loss. Denies chest pain, dyspnea, dizziness, lightheadedness, syncope, near syncope. Denies any other upper or lower GI symptoms.  Past Medical History:  Diagnosis Date  . Anxiety   . BMI (body mass index) 20.0-29.01 Apr 2010 172 LBS  . Chronic back pain   . Degenerative disc disease, thoracic   . Gastroparesis   . GERD (gastroesophageal reflux disease)   . Headache(784.0)   . IBS (irritable bowel syndrome)   . Incontinence of feces    WEARS DEPENDS AT HS    . Neuropathy   . PONV (postoperative nausea and vomiting)   . Pseudoobstruction of colon 08/24/2014  . Seasonal allergies   . Serrated adenoma of colon DEC 2011    Past Surgical History:  Procedure Laterality Date  . ABLATION     endometrial ablation  . APPENDECTOMY    . BACK SURGERY  05/14/2011   Lumbar Fusion and cage  . BIOPSY  06/22/2015   Procedure: BIOPSY;  Surgeon: Danie Binder, MD;  [facility];  Service: Endoscopy;;  gastric and esophageal bx's  .  BREAST LUMPECTOMY     from the left breast 2003,  2016  . COLECTOMY N/A 08/24/2014   Procedure: TOTAL COLECTOMY;  Surgeon: Jamesetta So, MD;  [facility]: [facility];  Service: General;  Laterality: N/A;  . COLONOSCOPY  NOV 2011 SCREENING    HYPERPLASTIC RECTAL POLYP, SML IH, incomplete due to discomfort  . COLONOSCOPY  DEC 2011 w/ PROPOFOL   SERRATED ADENOMA, HYPERPLASTIC POLY[S[  . ESOPHAGEAL DILATION    . ESOPHAGOGASTRODUODENOSCOPY (EGD) WITH PROPOFOL N/A 06/22/2015   Procedure: ESOPHAGOGASTRODUODENOSCOPY (EGD) WITH  PROPOFOL;  Surgeon: Danie Binder, MD;  [facility];  Service: Endoscopy;  Laterality: N/A;  . ESOPHAGOGASTRODUODENOSCOPY (EGD) WITH PROPOFOL N/A 01/21/2017   Procedure: ESOPHAGOGASTRODUODENOSCOPY (EGD) WITH PROPOFOL;  Surgeon: Danie Binder, MD;  [facility];  Service: Endoscopy;  Laterality: N/A;  9:30am  . FLEXIBLE SIGMOIDOSCOPY N/A 06/22/2015   Procedure: FLEXIBLE SIGMOIDOSCOPY WITH PROPOFOL;  Surgeon: Danie Binder, MD;  [facility];  Service: Endoscopy;  Laterality: N/A;  0830   . HARDWARE REMOVAL N/A 09/03/2016   Procedure: Removal of Lumbar five-Sacrum one  hardware with Metrex;  Surgeon: Kristeen Miss, MD;  Location: [facility];  Service: Neurosurgery;  Laterality: N/A;  . KNEE SURGERY Bilateral    2 on righ-1 scope and one open; and one on left  . MOUTH SURGERY  07/21/2013  . OVARIAN CYST REMOVAL    . SAVORY DILATION N/A 01/21/2017   Procedure: SAVORY DILATION;  Surgeon: Danie Binder, MD;  [facility];  Service: Endoscopy;  Laterality: N/A;  . SMART PILL PROCEDURE N/A 07/04/2014   Procedure: SMART PILL PROCEDURE;  Surgeon: Danie Binder, MD;  [facility];  Service: Endoscopy;  Laterality: N/A;  800  . TUBAL LIGATION      Current Outpatient Medications  Medication Sig Dispense Refill  . Cyanocobalamin (B-12 COMPLIANCE INJECTION IJ) Inject as directed once a week. Tapering    . cyclobenzaprine (FLEXERIL) 10 MG tablet Take 10 mg by mouth 3 (three) times daily.  1  . fluticasone (FLONASE) 50 MCG/ACT nasal spray Place 1 spray into both nostrils 2 (two) times daily.    . furosemide (LASIX) 20 MG tablet Take 20 mg by mouth 2 (two) times a week. Mondays and Fridays    . gabapentin (NEURONTIN) 300 MG capsule Take 300 mg by mouth 3 (three) times daily.    Marland Kitchen LINZESS 290 MCG CAPS capsule TAKE 1 CAPSULE BY MOUTH EVERY DAY 30 MINUTES PRIOR TO BREAKFAST 30 capsule 11  . loratadine (CLARITIN) 10 MG tablet Take 10 mg by mouth daily.      .  meloxicam (MOBIC) 15 MG tablet Take 15 mg by mouth daily.    . Olopatadine HCl (PAZEO) 0.7 % SOLN Place 1 drop into both eyes daily.     Marland Kitchen omeprazole (PRILOSEC) 20 MG capsule TAKE ONE CAPSULE BY MOUTH TWICE DAILY BEFORE MEALS 60 capsule 2  . ondansetron (ZOFRAN) 4 MG tablet Take 4 mg by mouth every 4 (four) hours as needed for nausea or vomiting.    Marland Kitchen oxyCODONE (OXY IR/ROXICODONE) 5 MG immediate release tablet Take 5 mg by mouth 2 (two) times daily.     . SUMAtriptan (IMITREX) 100 MG tablet Take 100 mg by mouth every 2 (two) hours as needed for migraine or headache. May repeat in 2 hours if headache persists or recurs.    . topiramate (TOPAMAX) 50 MG tablet Take 50 mg by mouth 2 (two)  times daily.     . Wheat Dextrin (BENEFIBER DRINK MIX PO) Take 15 mLs by mouth 2 (two) times daily. Mixed with water. Alternates with fiber gummies     No current facility-administered medications for this visit.     Allergies as of 12/17/2017 - Review Complete 12/17/2017  Allergen Reaction Noted  . Aspirin Shortness Of Breath, Swelling, and Rash   . Bee venom Anaphylaxis 07/08/2013  . Codeine Nausea And Vomiting and Rash   . Penicillins Other (See Comments)     Family History  Problem Relation Age of Onset  . Breast cancer Maternal Aunt   . Colon cancer Neg Hx   . Colon polyps Neg Hx     Social History   Socioeconomic History  . Marital status: Married    Spouse name: Not on file  . Number of children: Not on file  . Years of education: Not on file  . Highest education level: Not on file  Occupational History  . Not on file  Social Needs  . Financial resource strain: Not on file  . Food insecurity:    Worry: Not on file    Inability: Not on file  . Transportation needs:    Medical: Not on file    Non-medical: Not on file  Tobacco Use  . Smoking status: Current Every Day Smoker    Packs/day: 1.00    Years: 36.00    Pack years: 36.00    Types: Cigarettes  . Smokeless tobacco: Never Used   Substance and Sexual Activity  . Alcohol use: Yes    Comment: once a year when they go to the beach  . Drug use: No  . Sexual activity: Yes    Birth control/protection: Surgical, Post-menopausal    Comment: tubal  Lifestyle  . Physical activity:    Days per week: Not on file    Minutes per session: Not on file  . Stress: Not on file  Relationships  . Social connections:    Talks on phone: Not on file    Gets together: Not on file    Attends religious service: Not on file    Active member of club or organization: Not on file    Attends meetings of clubs or organizations: Not on file    Relationship status: Not on file  Other Topics Concern  . Not on file  Social History Narrative   DAUGHTER IS SEEING IMPAIRED. LEARNING TO PLAY VIOLIN.    Review of Systems: Complete ROS negative except as per HPI.  Physical Exam: BP 121/85   Pulse 91   Temp 98.1 F (36.7 C) (Oral)   Ht 5\' 2"  (1.575 m)   Wt 153 lb 6.4 oz (69.6 kg)   LMP 07/22/2011 Comment: tubal ligation/ablation  BMI 28.06 kg/m  General:   Alert and oriented. Pleasant and cooperative. Well-nourished and well-developed.  Eyes:  Without icterus, sclera clear and conjunctiva pink.  Ears:  Normal auditory acuity. Cardiovascular:  S1, S2 present without murmurs appreciated. Extremities without clubbing or edema. Respiratory:  Clear to auscultation bilaterally. No wheezes, rales, or rhonchi. No distress.  Gastrointestinal:  +BS, soft, non-tender and non-distended. No HSM noted. No guarding or rebound. No masses appreciated.  Rectal:  Deferred  Musculoskalatal:  Symmetrical without gross deformities. Neurologic:  Alert and oriented x4;  grossly normal neurologically. Psych:  Alert and cooperative. Normal mood and affect. Heme/Lymph/Immune: No excessive bruising noted.    12/17/2017 3:40 PM   Disclaimer: This note was dictated  with voice recognition software. Similar sounding words can inadvertently be transcribed and  may not be corrected upon review.

## 2017-12-17 NOTE — Assessment & Plan Note (Signed)
GERD symptoms improved.  Some persistent GERD on twice daily PPI, although she states she is okay with it.  She does still have some dysphasia as per below.  We will proceed with esophageal manometry as previously recommended on endoscopy.  Follow-up in 3 months.

## 2017-12-17 NOTE — Assessment & Plan Note (Signed)
Noted bloating and gas.  I will start her on probiotics to see if this helps.  Follow-up in 3 months.

## 2017-12-17 NOTE — Assessment & Plan Note (Signed)
She is having dysphasia symptoms still.  She recently had an EGD with dilation about 1 year ago.  Recommendations after previous EGD if she has persistent symptoms to set up for manometry.  At this point we will refer her to [facility] for [facility]ometry.  Follow-up in 3 months.  Continue PPI.

## 2017-12-18 ENCOUNTER — Encounter: Payer: Self-pay | Admitting: Gastroenterology

## 2017-12-18 ENCOUNTER — Telehealth: Payer: Self-pay | Admitting: Gastroenterology

## 2017-12-18 ENCOUNTER — Other Ambulatory Visit: Payer: Self-pay

## 2017-12-18 DIAGNOSIS — R1310 Dysphagia, unspecified: Secondary | ICD-10-CM

## 2017-12-18 NOTE — Progress Notes (Signed)
Patient contacted and scheduled for 12/29/17 at 10:30 am. Instructed no solids 4 hours prior. Take medications.

## 2017-12-18 NOTE — Telephone Encounter (Signed)
Pt returned your call. Could you pls call her again? Thank you.

## 2017-12-18 NOTE — Telephone Encounter (Signed)
No answer

## 2017-12-18 NOTE — Progress Notes (Signed)
CC'D TO PCP °

## 2017-12-19 ENCOUNTER — Other Ambulatory Visit: Payer: Self-pay

## 2017-12-19 NOTE — Telephone Encounter (Signed)
Patient contacted and scheduled for 12/29/17.

## 2017-12-29 ENCOUNTER — Ambulatory Visit (HOSPITAL_COMMUNITY)
Admission: RE | Admit: 2017-12-29 | Discharge: 2017-12-29 | Disposition: A | Discharge status: Home / Self Care | Payer: Medicaid Other | Source: Ambulatory Visit | Attending: Gastroenterology | Admitting: Gastroenterology

## 2017-12-29 ENCOUNTER — Encounter (HOSPITAL_COMMUNITY)
Admission: RE | Disposition: A | Discharge status: Home / Self Care | Payer: Self-pay | Source: Ambulatory Visit | Attending: Gastroenterology

## 2017-12-29 DIAGNOSIS — R1310 Dysphagia, unspecified: Secondary | ICD-10-CM

## 2017-12-29 HISTORY — PX: ESOPHAGEAL MANOMETRY: SHX5429

## 2017-12-29 SURGERY — MANOMETRY, ESOPHAGUS

## 2017-12-29 MED ORDER — LIDOCAINE VISCOUS HCL 2 % MT SOLN
OROMUCOSAL | Status: AC
  Filled 2017-12-29: qty 15

## 2017-12-29 SURGICAL SUPPLY — 2 items
FACESHIELD LNG OPTICON STERILE (SAFETY) IMPLANT
GLOVE BIO SURGEON STRL SZ8 (GLOVE) ×6 IMPLANT

## 2017-12-29 NOTE — Progress Notes (Signed)
Esophageal manometry performed per protocol.  Patient tolerated procedure without complications.  Dr. Nandigam to interpret results. 

## 2017-12-30 ENCOUNTER — Encounter (HOSPITAL_COMMUNITY): Payer: Self-pay | Admitting: Gastroenterology

## 2018-02-12 ENCOUNTER — Encounter: Payer: Self-pay | Admitting: Gastroenterology

## 2018-03-12 ENCOUNTER — Ambulatory Visit: Payer: Medicaid Other | Admitting: Gastroenterology

## 2018-03-14 ENCOUNTER — Other Ambulatory Visit: Payer: Self-pay | Admitting: Gastroenterology

## 2018-03-14 DIAGNOSIS — K5900 Constipation, unspecified: Secondary | ICD-10-CM

## 2018-03-14 DIAGNOSIS — R1310 Dysphagia, unspecified: Secondary | ICD-10-CM

## 2018-03-14 DIAGNOSIS — K219 Gastro-esophageal reflux disease without esophagitis: Secondary | ICD-10-CM

## 2018-03-19 ENCOUNTER — Ambulatory Visit: Payer: Medicaid Other | Admitting: Gastroenterology

## 2018-05-06 ENCOUNTER — Encounter: Payer: Self-pay | Admitting: Gastroenterology

## 2018-05-06 ENCOUNTER — Ambulatory Visit: Payer: Medicaid Other | Admitting: Gastroenterology

## 2018-05-06 ENCOUNTER — Other Ambulatory Visit: Payer: Self-pay

## 2018-05-06 DIAGNOSIS — R209 Unspecified disturbances of skin sensation: Principal | ICD-10-CM

## 2018-05-06 DIAGNOSIS — K219 Gastro-esophageal reflux disease without esophagitis: Secondary | ICD-10-CM | POA: Diagnosis not present

## 2018-05-06 DIAGNOSIS — K581 Irritable bowel syndrome with constipation: Secondary | ICD-10-CM | POA: Diagnosis not present

## 2018-05-06 DIAGNOSIS — K3184 Gastroparesis: Secondary | ICD-10-CM | POA: Diagnosis not present

## 2018-05-06 DIAGNOSIS — IMO0001 Reserved for inherently not codable concepts without codable children: Secondary | ICD-10-CM

## 2018-05-06 NOTE — Patient Instructions (Addendum)
SEE NEUROLOGY FOR WEAKNESS IN HANDS AND BURNING IN HANDS AND LEGS.  AVOID ITEMS THAT CAUSE BLOATING & GAS. SEE INFO BELOW.  CONTINUE OMEPRAZOLE.  TAKE 30 MINUTES PRIOR TO YOUR MEALS ONCE OR TWICE DAILY. USE LOWEST MOST EFFECTIVE DOSE.  [place].  FOLLOW UP IN 6 MOS.    BLOATING AND GAS PREVENTION  Although gas may be uncomfortable and embarrassing, it is not life-threatening. Understanding causes, ways to reduce symptoms, and treatment will help most people find some relief. Points to remember . Everyone has gas in the digestive tract. Marland Kitchen People often believe normal passage of gas to be excessive. . Gas comes from two main sources: swallowed air and normal breakdown of certain foods by harmless bacteria naturally present in the large intestine. . Many foods with carbohydrates can cause gas. Fats and proteins cause little gas. . Foods that may cause gas include o beans  o vegetables, such as broccoli, cabbage, brussels sprouts, onions, artichokes, and asparagus  o fruits, such as pears, apples, and peaches  o whole grains, such as whole wheat and bran  o soft drinks and fruit drinks  o milk and milk products, such as cheese and ice cream, and packaged foods prepared with lactose, such as bread, cereal, and salad dressing  o foods containing sorbitol, such as dietetic foods and sugar free candies and gums . The most common symptoms of gas are belching, flatulence, bloating, and abdominal pain. However, some of these symptoms are often caused by an intestinal disorder, such as irritable bowel syndrome, rather than too much gas. . The most common ways to reduce the discomfort of gas are changing diet, taking nonprescription medicines, and reducing the amount of air swallowed. . Digestive enzymes, such as lactase supplements, actually help digest carbohydrates and may allow people to eat foods that normally cause gas.

## 2018-05-06 NOTE — Assessment & Plan Note (Signed)
SYMPTOMS FAIRLY WELL CONTROLLED but continues with bloating.  AVOID ITEMS THAT CAUSE BLOATING & GAS.  HANDOUT GIVEN.. CONTINUE [place].  FOLLOW UP IN 6 MOS.

## 2018-05-06 NOTE — Assessment & Plan Note (Signed)
SYMPTOMS FAIRLY WELL CONTROLLED.  CONTINUE TO MONITOR SYMPTOMS. FOLLOW UP IN 6 MOS.  

## 2018-05-06 NOTE — Progress Notes (Signed)
Subjective:    Patient ID: Alicia Tyler, female    DOB: 01-Jan-1971, 47 y.o.   MRN: [id number]  HPI BURNS FROM BOTTOM OF STOMACH AND GOES ALL THE WAY UP TO HER THROAT. IT'S ALL DAY AND ALL NIGHT. FEELS LIKE ACID REFLUX FEELS. TRIGGERS: ??. STARTED BEFORE INCREASE IN GABAPENTIN. HER NEUROPATHY GETTING WORSE AND HAD STINGING IN HANDS AND FEET. DROPPED CAT BOWL  AND DROPPED CAN YESTERDAY.  SEEING A NEURO SURGEON BUT NOT A NEUROLOGIST. BMs: SAME(GOOD). STAYS BLOATED WITH GAS. CAN'T PASS GASS AND HEARS IT AND FEELS IT. DRINKING WATER. FEELS "CONSTIPATED" IN THE AM BUT THEN TAKES LINZESS AND SHE'S ALRIGHT. ZOFRAN HELPS HER NAUSEA: IF MIGRAINES MORE, ~15/MO. MAY HAVE WATERY STOOL OCCASIONALLY. MAY HAVE TROUBLE SWALLOWING PILLS, MEAT, AND CHICKEN.  PT DENIES FEVER, CHILLS, HEMATOCHEZIA, HEMATEMESIS, vomiting, melena, CHEST PAIN, SHORTNESS OF BREATH, CHANGE IN BOWEL IN HABITS,  problems swallowing, OR heartburn or indigestion.  Past Medical History:  Diagnosis Date  . Anxiety   . BMI (body mass index) 20.0-29.01 Apr 2010 172 LBS  . Chronic back pain   . Degenerative disc disease, thoracic   . Gastroparesis   . GERD (gastroesophageal reflux disease)   . Headache(784.0)   . IBS (irritable bowel syndrome)   . Incontinence of feces    WEARS DEPENDS AT HS    . Neuropathy   . PONV (postoperative nausea and vomiting)   . Pseudoobstruction of colon 08/24/2014  . Seasonal allergies   . Serrated adenoma of colon DEC 2011   Past Surgical History:  Procedure Laterality Date  . ABLATION     endometrial ablation  . APPENDECTOMY    . BACK SURGERY  05/14/2011   Lumbar Fusion and cage  . BIOPSY  06/22/2015   Procedure: BIOPSY;  Surgeon: Danie Binder, MD;  [facility]: [facility];  Service: Endoscopy;;  gastric and esophageal bx's  . BREAST LUMPECTOMY     from the left breast 2003,  2016  . COLECTOMY N/A 08/24/2014   Procedure: TOTAL COLECTOMY;  Surgeon: Jamesetta So, MD;  Location: [facility];  Service:  General;  Laterality: N/A;  . COLONOSCOPY  NOV 2011 SCREENING    HYPERPLASTIC RECTAL POLYP, SML IH, incomplete due to discomfort  . COLONOSCOPY  DEC 2011 w/ PROPOFOL   SERRATED ADENOMA, HYPERPLASTIC POLY[S[  . ESOPHAGEAL DILATION    . ESOPHAGEAL MANOMETRY N/A 12/29/2017   Procedure: ESOPHAGEAL MANOMETRY (EM);  Surgeon: Mauri Pole, MD;  Location: [facility];  Service: Endoscopy;  Laterality: N/A;  . ESOPHAGOGASTRODUODENOSCOPY (EGD) WITH PROPOFOL N/A 06/22/2015   Procedure: ESOPHAGOGASTRODUODENOSCOPY (EGD) WITH PROPOFOL;  Surgeon: Danie Binder, MD;  [facility];  Service: Endoscopy;  Laterality: N/A;  . ESOPHAGOGASTRODUODENOSCOPY (EGD) WITH PROPOFOL N/A 01/21/2017     . FLEXIBLE SIGMOIDOSCOPY N/A 06/22/2015     . HARDWARE REMOVAL N/A 09/03/2016     . KNEE SURGERY Bilateral    2 on righ-1 scope and one open; and one on left  . MOUTH SURGERY  07/21/2013  . OVARIAN CYST REMOVAL    . SAVORY DILATION N/A 01/21/2017     . SMART PILL PROCEDURE N/A 07/04/2014     . TUBAL LIGATION    Review of Systems PER HPI OTHERWISE ALL SYSTEMS ARE NEGATIVE.  Allergies  Allergen Reactions  . Aspirin Shortness Of Breath, Swelling and Rash  . Bee Venom Anaphylaxis       . Codeine Nausea And Vomiting and Rash  . Penicillins Other (See Comments)  Current Outpatient Medications  Medication Sig    . Cyanocobalamin (B-12 COMPLIANCE INJECTION IJ) Inject as directed every 30 (thirty) days. Tapering     . cyclobenzaprine (FLEXERIL) 10 MG tablet Take 10 mg by mouth 3 (three) times daily.    . fluticasone (FLONASE) 50 MCG/ACT nasal spray Place 1 spray into both nostrils 2 (two) times daily.    . furosemide (LASIX) 20 MG tablet Take 20 mg by mouth 2 (two) times a week. Mondays and Fridays    . gabapentin (NEURONTIN) 300 MG capsule Take 600 mg by mouth 3 (three) times daily.     Marland Kitchen LINZESS 290 MCG CAPS capsule TAKE 1 CAPSULE BY MOUTH EVERY DAY 30 MINUTES PRIOR TO BREAKFAST    .  loratadine (CLARITIN) 10 MG tablet Take 10 mg by mouth daily.      . meloxicam (MOBIC) 15 MG tablet Take 15 mg by mouth daily.    . Olopatadine HCl (PAZEO) 0.7 % SOLN Place 1 drop into both eyes daily.     Marland Kitchen omeprazole (PRILOSEC) 20 MG capsule TAKE ONE CAPSULE BY MOUTH TWICE DAILY BEFORE MEALS    . ondansetron (ZOFRAN) 4 MG tablet Take 4 mg by mouth every 4 (four) hours as needed for nausea or vomiting.    Marland Kitchen oxyCODONE (OXY IR/ROXICODONE) 5 MG immediate release tablet Take 5 mg by mouth 2 (two) times daily.     . SUMAtriptan (IMITREX) 100 MG tablet Take 100 mg by mouth every 2 (two) hours as needed for migraine or headache. May repeat in 2 hours if headache persists or recurs.    . topiramate (TOPAMAX) 50 MG tablet Take 50 mg by mouth 2 (two) times daily.     . Wheat Dextrin (BENEFIBER DRINK MIX PO) Take 15 mLs by mouth 2 (two) times daily. Mixed with water. Alternates with fiber gummies         Objective:   Physical Exam  Constitutional: She is oriented to person, place, and time. She appears well-developed and well-nourished. No distress.  HENT:  Head: Normocephalic and atraumatic.  Mouth/Throat: Oropharynx is clear and moist. No oropharyngeal exudate.  Eyes: Pupils are equal, round, and reactive to light. No scleral icterus.  Neck: Normal range of motion. Neck supple.  Cardiovascular: Normal rate, regular rhythm and normal heart sounds.  Pulmonary/Chest: Effort normal and breath sounds normal. No respiratory distress.  Abdominal: Soft. Bowel sounds are normal. She exhibits distension (MILD). There is no tenderness.  Musculoskeletal: She exhibits no edema.  Lymphadenopathy:    She has no cervical adenopathy.  Neurological: She is alert and oriented to person, place, and time.  NO  NEW FOCAL DEFICITS OR GROSS DEFICITS  Psychiatric:  SLIGHTLY ANXIOUS MOOD, NL AFFECT  Vitals reviewed.     Assessment & Plan:

## 2018-05-06 NOTE — Assessment & Plan Note (Signed)
SYMPTOMS CONTROLLED/RESOLVED.  CONTINUE OMEPRAZOLE.  TAKE 30 MINUTES PRIOR TO YOUR MEALS ONCE OR TWICE DAILY. USE LOWEST MOST EFFECTIVE DOSE. FOLLOW UP IN 6 MOS.

## 2018-05-06 NOTE — Assessment & Plan Note (Signed)
SYMPTOMS NOT CONTROLLED and associated with dropping things when they are in her hands/burning in legs/abdomen/chest.  SEE NEUROLOGY FOR WEAKNESS IN HANDS AND BURNING IN HANDS AND LEGS. FOLLOW UP IN 6 MOS.

## 2018-05-07 NOTE — Progress Notes (Signed)
ON RECALL  °

## 2018-05-07 NOTE — Progress Notes (Signed)
cc'ed to pcp °

## 2018-05-13 ENCOUNTER — Telehealth: Payer: Self-pay

## 2018-05-13 NOTE — Telephone Encounter (Signed)
REVIEWED-NO ADDITIONAL RECOMMENDATIONS. 

## 2018-05-13 NOTE — Telephone Encounter (Signed)
Per Proficient, neurology referral will need to come from PCP d/t Medicaid. Tried to call pt, no answer, LMOVM to contact her PCP to make referral for her.

## 2018-06-07 ENCOUNTER — Emergency Department (HOSPITAL_COMMUNITY)
Admission: EM | Admit: 2018-06-07 | Discharge: 2018-06-07 | Disposition: A | Discharge status: Home / Self Care | Payer: Medicaid Other | Attending: Emergency Medicine | Admitting: Emergency Medicine

## 2018-06-07 ENCOUNTER — Emergency Department (HOSPITAL_COMMUNITY): Payer: Medicaid Other

## 2018-06-07 ENCOUNTER — Encounter (HOSPITAL_COMMUNITY): Payer: Self-pay

## 2018-06-07 ENCOUNTER — Other Ambulatory Visit: Payer: Self-pay

## 2018-06-07 DIAGNOSIS — R51 Headache: Secondary | ICD-10-CM | POA: Diagnosis not present

## 2018-06-07 DIAGNOSIS — F17210 Nicotine dependence, cigarettes, uncomplicated: Secondary | ICD-10-CM | POA: Insufficient documentation

## 2018-06-07 DIAGNOSIS — Z79899 Other long term (current) drug therapy: Secondary | ICD-10-CM | POA: Insufficient documentation

## 2018-06-07 DIAGNOSIS — I889 Nonspecific lymphadenitis, unspecified: Secondary | ICD-10-CM

## 2018-06-07 DIAGNOSIS — G43909 Migraine, unspecified, not intractable, without status migrainosus: Secondary | ICD-10-CM

## 2018-06-07 DIAGNOSIS — R221 Localized swelling, mass and lump, neck: Secondary | ICD-10-CM | POA: Insufficient documentation

## 2018-06-07 MED ORDER — CLINDAMYCIN HCL 150 MG PO CAPS
450.0000 mg | ORAL_CAPSULE | Freq: Once | ORAL | Status: AC
Start: 2018-06-07 — End: 2018-06-07
  Administered 2018-06-07: 450 mg via ORAL
  Filled 2018-06-07: qty 3

## 2018-06-07 MED ORDER — CLINDAMYCIN HCL 150 MG PO CAPS: ORAL_CAPSULE | ORAL | 0 refills | Status: DC

## 2018-06-07 MED ORDER — METOCLOPRAMIDE HCL 10 MG PO TABS
10.0000 mg | ORAL_TABLET | Freq: Once | ORAL | Status: AC
  Administered 2018-06-07: 10 mg via ORAL
  Filled 2018-06-07: qty 1

## 2018-06-07 MED ORDER — METOCLOPRAMIDE HCL 10 MG PO TABS: 10.0000 mg | ORAL_TABLET | Freq: Four times a day (QID) | ORAL | 0 refills | Status: DC | PRN

## 2018-06-07 MED ORDER — DIPHENHYDRAMINE HCL 25 MG PO CAPS
50.0000 mg | ORAL_CAPSULE | Freq: Once | ORAL | Status: AC
  Administered 2018-06-07: 50 mg via ORAL
  Filled 2018-06-07: qty 2

## 2018-06-07 MED ORDER — ACETAMINOPHEN 325 MG PO TABS
650.0000 mg | ORAL_TABLET | Freq: Once | ORAL | Status: AC
  Administered 2018-06-07: 650 mg via ORAL
  Filled 2018-06-07: qty 2

## 2018-06-07 NOTE — Discharge Instructions (Signed)
Take over the counter tylenol and benadryl, as directed on packaging, with the prescription given to you today, as needed for headache. Take the antibiotic prescription as directed. Call your regular medical doctor tomorrow to schedule a follow up appointment within the next 3 days. Keep your Neurologist appointment tomorrow as previously scheduled.   Return to the Emergency Department immediately sooner if worsening.

## 2018-06-07 NOTE — ED Triage Notes (Signed)
Pt reports she was initially bit by an insect approx 3 weeks ago. Now she feels like she has a knot on the left side of neck and woke up with sharp neck pain/HA. She has a hx of migraines so she took her migraine medication. She feels nauseated and has taken a zofran

## 2018-06-07 NOTE — ED Provider Notes (Signed)
[facility] EMERGENCY DEPARTMENT Provider Note   CSN: [id number] Arrival date & time: 06/07/18  1258     History   Chief Complaint Chief Complaint  Patient presents with  . Insect Bite    HPI Alicia Tyler is a 47 y.o. female.  HPI  Pt was seen at 1455. Per pt, c/o gradual onset and persistence of constant "knots" on the left side of her neck for the past 3 to 4 weeks. State they "started after I got bit by something over there." Pt states they are "sore."  States they are now causing her to have a migraine headache in the back of her head for the past 2 to 3 days.  Has been associated with nausea, per her usual chronic migraine headache pain pattern.  Denies headache was sudden or maximal in onset or at any time.  Denies visual changes, no focal motor weakness, no tingling/numbness in extremities, no fevers, no sore throat, no intra-oral edema, no cough/SOB, no abd pain, no vomiting/diarrhea.    Past Medical History:  Diagnosis Date  . Anxiety   . BMI (body mass index) 20.0-29.01 Apr 2010 172 LBS  . Chronic back pain   . Degenerative disc disease, thoracic   . Gastroparesis   . GERD (gastroesophageal reflux disease)   . Headache(784.0)   . IBS (irritable bowel syndrome)   . Incontinence of feces    WEARS DEPENDS AT HS    . Neuropathy   . PONV (postoperative nausea and vomiting)   . Pseudoobstruction of colon 08/24/2014  . Seasonal allergies   . Serrated adenoma of colon 05/2010    Patient Active Problem List   Diagnosis Date Noted  . Paresthesias/numbness 05/06/2018  . Bloating 12/17/2017  . Constipation 09/09/2017  . Lumbar pseudoarthrosis 09/03/2016  . Dysphagia 05/31/2015  . ARF (acute renal failure) ([place]) 09/03/2014  . UTI (urinary tract infection) 09/03/2014  . Gastroparesis 07/15/2014  . GERD (gastroesophageal reflux disease) 06/29/2014  . Colon adenoma 01/17/2011  . Irritable bowel syndrome 04/12/2010    Past Surgical History:  Procedure Laterality Date   . ABLATION     endometrial ablation  . APPENDECTOMY    . BACK SURGERY  05/14/2011   Lumbar Fusion and cage  . BIOPSY  06/22/2015   Procedure: BIOPSY;  Surgeon: Danie Binder, MD;  [facility];  Service: Endoscopy;;  gastric and esophageal bx's  . BREAST LUMPECTOMY     from the left breast 2003,  2016  . COLECTOMY N/A 08/24/2014   Procedure: TOTAL COLECTOMY;  Surgeon: Jamesetta So, MD;  Location: [facility];  Service: General;  Laterality: N/A;  . COLON SURGERY    . COLONOSCOPY  NOV 2011 SCREENING    HYPERPLASTIC RECTAL POLYP, SML IH, incomplete due to discomfort  . COLONOSCOPY  DEC 2011 w/ PROPOFOL   SERRATED ADENOMA, HYPERPLASTIC POLY[S[  . ESOPHAGEAL DILATION    . ESOPHAGEAL MANOMETRY N/A 12/29/2017   Procedure: ESOPHAGEAL MANOMETRY (EM);  Surgeon: Mauri Pole, MD;  [facility]: [facility];  Service: Endoscopy;  Laterality: N/A;  . ESOPHAGOGASTRODUODENOSCOPY (EGD) WITH PROPOFOL N/A 06/22/2015   Procedure: ESOPHAGOGASTRODUODENOSCOPY (EGD) WITH PROPOFOL;  Surgeon: Danie Binder, MD;  [facility];  Service: Endoscopy;  Laterality: N/A;  . ESOPHAGOGASTRODUODENOSCOPY (EGD) WITH PROPOFOL N/A 01/21/2017   Procedure: ESOPHAGOGASTRODUODENOSCOPY (EGD) WITH PROPOFOL;  Surgeon: Danie Binder, MD;  [facility];  Service: Endoscopy;  Laterality: N/A;  9:30am  . FLEXIBLE SIGMOIDOSCOPY N/A 06/22/2015  Procedure: FLEXIBLE SIGMOIDOSCOPY WITH PROPOFOL;  Surgeon: Danie Binder, MD;  [facility];  Service: Endoscopy;  Laterality: N/A;  0830   . HARDWARE REMOVAL N/A 09/03/2016   Procedure: Removal of Lumbar five-Sacrum one  hardware with Metrex;  Surgeon: Kristeen Miss, MD;  Location: [facility];  Service: Neurosurgery;  Laterality: N/A;  . KNEE SURGERY Bilateral    2 on righ-1 scope and one open; and one on left  . MOUTH SURGERY  07/21/2013  . OVARIAN CYST REMOVAL    . SAVORY DILATION N/A 01/21/2017   Procedure: SAVORY DILATION;  Surgeon: Danie Binder,  MD;  [facility];  Service: Endoscopy;  Laterality: N/A;  . SMART PILL PROCEDURE N/A 07/04/2014   Procedure: SMART PILL PROCEDURE;  Surgeon: Danie Binder, MD;  [facility];  Service: Endoscopy;  Laterality: N/A;  800  . TUBAL LIGATION       OB History   No obstetric history on file.      Home Medications    Prior to Admission medications   Medication Sig Start Date End Date Taking? Authorizing Provider  Cyanocobalamin (B-12 COMPLIANCE INJECTION IJ) Inject as directed every 30 (thirty) days. Tapering     [provider]  cyclobenzaprine (FLEXERIL) 10 MG tablet Take 10 mg by mouth 3 (three) times daily. 12/24/16   [provider]  fluticasone (FLONASE) 50 MCG/ACT nasal spray Place 1 spray into both nostrils 2 (two) times daily.    [provider]  furosemide (LASIX) 20 MG tablet Take 20 mg by mouth 2 (two) times a week. Mondays and Fridays    [provider]  gabapentin (NEURONTIN) 300 MG capsule Take 600 mg by mouth 3 (three) times daily.     [provider]  LINZESS 290 MCG CAPS capsule TAKE 1 CAPSULE BY MOUTH EVERY DAY 30 MINUTES PRIOR TO BREAKFAST 09/05/17   Mahala Menghini, PA-C  loratadine (CLARITIN) 10 MG tablet Take 10 mg by mouth daily.      [provider]  meloxicam (MOBIC) 15 MG tablet Take 15 mg by mouth daily.    [provider]  Olopatadine HCl (PAZEO) 0.7 % SOLN Place 1 drop into both eyes daily.     [provider]  omeprazole (PRILOSEC) 20 MG capsule TAKE ONE CAPSULE BY MOUTH TWICE DAILY BEFORE MEALS 03/17/18   Carlis Stable, NP  ondansetron (ZOFRAN) 4 MG tablet Take 4 mg by mouth every 4 (four) hours as needed for nausea or vomiting.    [provider]  oxyCODONE (OXY IR/ROXICODONE) 5 MG immediate release tablet Take 5 mg by mouth 2 (two) times daily.     [provider]  SUMAtriptan (IMITREX) 100 MG tablet Take 100 mg by mouth every 2 (two) hours as needed for  migraine or headache. May repeat in 2 hours if headache persists or recurs.    [provider]  topiramate (TOPAMAX) 50 MG tablet Take 50 mg by mouth 2 (two) times daily.     [provider]  Wheat Dextrin (BENEFIBER DRINK MIX PO) Take 15 mLs by mouth 2 (two) times daily. Mixed with water. Alternates with fiber gummies    [provider]    Family History Family History  Problem Relation Age of Onset  . Breast cancer Maternal Aunt   . Colon cancer Neg Hx   . Colon polyps Neg Hx     Social History Social History   Tobacco Use  .  Smoking status: Current Every Day Smoker    Packs/day: 1.00    Years: 36.00    Pack years: 36.00    Types: Cigarettes  . Smokeless tobacco: Never Used  Substance Use Topics  . Alcohol use: Yes    Comment: once a year when they go to the beach  . Drug use: No     Allergies   Aspirin; Bee venom; Codeine; and Penicillins   Review of Systems Review of Systems ROS: Statement: All systems negative except as marked or noted in the HPI; Constitutional: Negative for fever and chills. ; ; Eyes: Negative for eye pain, redness and discharge. ; ; ENMT: Negative for ear pain, hoarseness, nasal congestion, sinus pressure and sore throat. ; ; Cardiovascular: Negative for chest pain, palpitations, diaphoresis, dyspnea and peripheral edema. ; ; Respiratory: Negative for cough, wheezing and stridor. ; ; Gastrointestinal: +nausea. Negative for vomiting, diarrhea, abdominal pain, blood in stool, hematemesis, jaundice and rectal bleeding. . ; ; Genitourinary: Negative for dysuria, flank pain and hematuria. ; ; Musculoskeletal: Negative for back pain and neck pain. Negative for swelling and trauma.; ; Skin: +lymph nodes left neck. Negative for pruritus, rash, abrasions, blisters, bruising and skin lesion.; ; Neuro: +chronic headache. Negative for lightheadedness and neck stiffness. Negative for weakness, altered level of consciousness, altered mental  status, extremity weakness, paresthesias, involuntary movement, seizure and syncope.       Physical Exam Updated Vital Signs BP (!) 134/96 (BP Location: Right Arm)   Pulse 92   Temp 98 F (36.7 C) (Oral)   Resp 12   Ht 5\' 3"  (1.6 m)   Wt 72.6 kg   LMP 07/22/2011 Comment: tubal ligation/ablation  SpO2 100%   BMI 28.34 kg/m   Physical Exam 1500: Physical examination:  Nursing notes reviewed; Vital signs and O2 SAT reviewed;  Constitutional: Well developed, Well nourished, Well hydrated, In no acute distress; Head:  Normocephalic, atraumatic; Eyes: EOMI, PERRL, No scleral icterus; ENMT: Mouth and pharynx normal, Mucous membranes moist. TM's clear bilat. Mouth and pharynx without lesions. No tonsillar exudates. No intra-oral edema. No submandibular or sublingual edema. No hoarse voice, no drooling, no stridor. No pain with manipulation of larynx. No trismus.;;; Neck: Supple, Full range of motion, +left anterior cervical chain lymphadenopathy, no overlying erythema, no ecchymosis, no open wounds. +small area of healing wound left lower lateral neck with mild surrounding erythema, no streaking, no drainage, no fluctuance, no soft tissue crepitus. No meningeal signs..; Cardiovascular: Regular rate and rhythm, No gallop; Respiratory: Breath sounds clear & equal bilaterally, No wheezes.  Speaking full sentences with ease, Normal respiratory effort/excursion; Chest: Nontender, Movement normal; Abdomen: Soft, Nontender, Nondistended, Normal bowel sounds; Genitourinary: No CVA tenderness; Spine:  No midline CS, TS, LS tenderness.;; Extremities: Peripheral pulses normal, No tenderness, No edema, No calf edema or asymmetry.; Neuro: AA&Ox3, Major CN grossly intact. No facial droop. Speech clear. No gross focal motor or sensory deficits in extremities. Climbs on and off stretcher easily by herself. Gait steady..; Skin: Color normal, Warm, Dry.    ED Treatments / Results  Labs (all labs ordered are listed,  but only abnormal results are displayed)   EKG None  Radiology   Procedures Procedures (including critical care time)  Medications Ordered in ED Medications  acetaminophen (TYLENOL) tablet 650 mg (650 mg Oral Given 06/07/18 1508)  metoCLOPramide (REGLAN) tablet 10 mg (10 mg Oral Given 06/07/18 1508)  diphenhydrAMINE (BENADRYL) capsule 50 mg (50 mg Oral Given 06/07/18 1508)  Initial Impression / Assessment and Plan / ED Course  I have reviewed the triage vital signs and the nursing notes.  Pertinent labs & imaging results that were available during my care of the patient were reviewed by me and considered in my medical decision making (see chart for details).  MDM Reviewed: previous chart, nursing note and vitals Interpretation: CT scan    Ct Head Wo Contrast Result Date: 06/07/2018 CLINICAL DATA:  Headache and neck pain after insect bite 3 weeks ago. EXAM: CT HEAD WITHOUT CONTRAST CT CERVICAL SPINE WITHOUT CONTRAST TECHNIQUE: Multidetector CT imaging of the head and cervical spine was performed following the standard protocol without intravenous contrast. Multiplanar CT image reconstructions of the cervical spine were also generated. COMPARISON:  MRI of June 23, 2017. FINDINGS: CT HEAD FINDINGS Brain: No evidence of acute infarction, hemorrhage, hydrocephalus, extra-axial collection or mass lesion/mass effect. Vascular: No hyperdense vessel or unexpected calcification. Skull: Normal. Negative for fracture or focal lesion. Sinuses/Orbits: No acute finding. Other: None. CT CERVICAL SPINE FINDINGS Alignment: Normal. Skull base and vertebrae: No acute fracture. No primary bone lesion or focal pathologic process. Soft tissues and spinal canal: No prevertebral fluid or swelling. No visible canal hematoma. Disc levels: Moderate degenerative disc disease is noted at C5-6 with anterior osteophyte formation. Upper chest: Negative. Other: None. IMPRESSION: Normal head CT. Moderate  degenerative disc disease is noted at C5-6. No acute abnormality seen in the cervical spine. Electronically Signed   By: Marijo Conception, M.D.   On: 06/07/2018 15:43   Ct Cervical Spine Wo Contrast Result Date: 06/07/2018 CLINICAL DATA:  Headache and neck pain after insect bite 3 weeks ago. EXAM: CT HEAD WITHOUT CONTRAST CT CERVICAL SPINE WITHOUT CONTRAST TECHNIQUE: Multidetector CT imaging of the head and cervical spine was performed following the standard protocol without intravenous contrast. Multiplanar CT image reconstructions of the cervical spine were also generated. COMPARISON:  MRI of June 23, 2017. FINDINGS: CT HEAD FINDINGS Brain: No evidence of acute infarction, hemorrhage, hydrocephalus, extra-axial collection or mass lesion/mass effect. Vascular: No hyperdense vessel or unexpected calcification. Skull: Normal. Negative for fracture or focal lesion. Sinuses/Orbits: No acute finding. Other: None. CT CERVICAL SPINE FINDINGS Alignment: Normal. Skull base and vertebrae: No acute fracture. No primary bone lesion or focal pathologic process. Soft tissues and spinal canal: No prevertebral fluid or swelling. No visible canal hematoma. Disc levels: Moderate degenerative disc disease is noted at C5-6 with anterior osteophyte formation. Upper chest: Negative. Other: None. IMPRESSION: Normal head CT. Moderate degenerative disc disease is noted at C5-6. No acute abnormality seen in the cervical spine. Electronically Signed   By: Marijo Conception, M.D.   On: 06/07/2018 15:43    1620:  Pt has tol PO well while in the ED without N/V. Abd remains benign, resps easy, neuro exam unchanged, VSS. Feels better and wants to go home now. 1st dose abx given in ED. Tx chronic headache symptomatically at this time and encouraged to keep her scheduled f/u with Neuro MD tomorrow. Dx and testing d/w pt and family.  Questions answered.  Verb understanding, agreeable to d/c home with outpt f/u.    Final Clinical  Impressions(s) / ED Diagnoses   Final diagnoses:  None    ED Discharge Orders    None       Francine Graven, DO 06/11/18 2150

## 2018-06-13 ENCOUNTER — Other Ambulatory Visit: Payer: Self-pay | Admitting: Nurse Practitioner

## 2018-06-13 DIAGNOSIS — K219 Gastro-esophageal reflux disease without esophagitis: Secondary | ICD-10-CM

## 2018-06-13 DIAGNOSIS — K5900 Constipation, unspecified: Secondary | ICD-10-CM

## 2018-06-13 DIAGNOSIS — R1310 Dysphagia, unspecified: Secondary | ICD-10-CM

## 2018-09-12 ENCOUNTER — Other Ambulatory Visit: Payer: Self-pay | Admitting: Gastroenterology

## 2018-10-27 ENCOUNTER — Encounter: Payer: Self-pay | Admitting: Gastroenterology

## 2019-01-21 ENCOUNTER — Other Ambulatory Visit: Payer: Self-pay

## 2019-01-21 ENCOUNTER — Ambulatory Visit: Payer: Medicaid Other | Admitting: Gastroenterology

## 2019-01-21 ENCOUNTER — Encounter: Payer: Self-pay | Admitting: Gastroenterology

## 2019-01-21 DIAGNOSIS — R1011 Right upper quadrant pain: Secondary | ICD-10-CM | POA: Insufficient documentation

## 2019-01-21 NOTE — Patient Instructions (Addendum)
DRINK WATER TO KEEP YOUR URINE LIGHT YELLOW.  FOLLOW A LACTOSE FREE DIET. SEE INFO BELOW. IF YOU CONSUME DAIRY, ADD LACTASE 3 PILLS WITH MEALS UP TO THREE TIMES A DAY.   TO REDUCE ABDOMINAL PAIN, LOSE WEIGHT, BOOST ENERGY AND GET BETTER SLEEP:     1. CONTINUE B12.  ADD VITAMIN D3 2000 IU DAILY IN THE MORNING.     2. FOLLOW RECOMMENDATIONS of DR. MARK HYMAN, "10-DAY DETOX DIET".  MEATS SHOULD BE BAKED, BROILED, OR BOILED. Avoid fried foods. DO NOT EAT FAST FOOD.     3. PRACTICE CHAIR YOGA FOR 15-30 MINS 3 OR 4 TIMES A WEEK.    4. Do not drink SODA, ENERGY DRINKS OR DIET SODA. AVOID HIGH FRUCTOSE CORN SYRUP. DO NOT chew SUGAR FREE GUM, OR USE ARTIFICIAL SWEETENERS. USE STEVIA IF NEEDED AS A SWEETENER.  FOLLOW UP IN 4 MOS.     Lactose Free Diet Lactose is a carbohydrate that is found mainly in milk and milk products, as well as in foods with added milk or whey. Lactose must be digested by the enzyme in order to be used by the body. Lactose intolerance occurs when there is a shortage of lactase. When your body is not able to digest lactose, you may feel sick to your stomach (nausea), bloating, cramping, gas and diarrhea.  There are many dairy products that may be tolerated better than milk by some people:  The use of cultured dairy products such as yogurt, buttermilk, cottage cheese, and sweet acidophilus milk (Kefir) for lactase-deficient individuals is usually well tolerated. This is because the healthy bacteria help digest lactose.   Lactose-hydrolyzed milk (Lactaid) contains 40-90% less lactose than milk and may also be well tolerated.    SPECIAL NOTES  Lactose is a carbohydrates. The major food source is dairy products. Reading food labels is important. Many products contain lactose even when they are not made from milk. Look for the following words: whey, milk solids, dry milk solids, nonfat dry milk powder. Typical sources of lactose other than dairy products include breads,  candies, cold cuts, prepared and processed foods, and commercial sauces and gravies.   All foods must be prepared without milk, cream, or other dairy foods.   Soy milk and lactose-free supplements (LACTASE) may be used as an alternative to milk.   FOOD GROUP ALLOWED/RECOMMENDED AVOID/USE SPARINGLY  BREADS / STARCHES 4 servings or more* Breads and rolls made without milk. [place], [place], or [place] bread. Breads and rolls that contain milk. Prepared mixes such as muffins, biscuits, waffles, pancakes. Sweet rolls, donuts, [place] toast (if made with milk or lactose).  Crackers: Soda crackers, graham crackers. Any crackers prepared without lactose. Zwieback crackers, corn curls, or any that contain lactose.  Cereals: Cooked or dry cereals prepared without lactose (read labels). Cooked or dry cereals prepared with lactose (read labels). Total, Cocoa Krispies. Special K.  Potatoes / Pasta / Rice: Any prepared without milk or lactose. Popcorn. Instant potatoes, frozen [place] fries, scalloped or au gratin potatoes.  VEGETABLES 2 servings or more Fresh, frozen, and canned vegetables. Creamed or breaded vegetables. Vegetables in a cheese sauce or with lactose-containing margarines.  FRUIT 2 servings or more All fresh, canned, or frozen fruits that are not processed with lactose. Any canned or frozen fruits processed with lactose.  MEAT & SUBSTITUTES 2 servings or more (4 to 6 oz. total per day) Plain beef, chicken, fish, [place], lamb, veal, pork, or ham. Kosher prepared meat products. Strained or junior meats  that do not contain milk. Eggs, soy meat substitutes, nuts. Scrambled eggs, omelets, and souffles that contain milk. Creamed or breaded meat, fish, or fowl. Sausage products such as wieners, liver sausage, or cold cuts that contain milk solids. Cheese, cottage cheese, or cheese spreads.  MILK None. (See "BEVERAGES" for milk substitutes. See "DESSERTS" for ice cream and frozen desserts.) Milk (whole,  2%, skim, or chocolate). Evaporated, powdered, or condensed milk; malted milk.  SOUPS & COMBINATION FOODS Bouillon, broth, vegetable soups, clear soups, consomms. Homemade soups made with allowed ingredients. Combination or prepared foods that do not contain milk or milk products (read labels). Cream soups, chowders, commercially prepared soups containing lactose. Macaroni and cheese, pizza. Combination or prepared foods that contain milk or milk products.  DESSERTS & SWEETS In moderation Water and fruit ices; gelatin; angel food cake. Homemade cookies, pies, or cakes made from allowed ingredients. Pudding (if made with water or a milk substitute). Lactose-free tofu desserts. Sugar, honey, corn syrup, jam, jelly; marmalade; molasses (beet sugar); Pure sugar candy; marshmallows. Ice cream, ice milk, sherbet, custard, pudding, frozen yogurt. Commercial cake and cookie mixes. Desserts that contain chocolate. Pie crust made with milk-containing margarine; reduced-calorie desserts made with a sugar substitute that contains lactose. Toffee, peppermint, butterscotch, chocolate, caramels.  FATS & OILS In moderation Butter (as tolerated; contains very small amounts of lactose). Margarines and dressings that do not contain milk, Vegetable oils, shortening, Miracle Whip, mayonnaise, nondairy cream & whipped toppings without lactose or milk solids added (examples: Coffee Rich, Carnation Coffeemate, Rich's Whipped Topping, PolyRich). Berniece Salines. Margarines and salad dressings containing milk; cream, cream cheese; peanut butter with added milk solids, sour cream, chip dips, made with sour cream.  BEVERAGES Carbonated drinks; tea; coffee and freeze-dried coffee; some instant coffees (check labels). Fruit drinks; fruit and vegetable juice; Rice or Soy milk. Ovaltine, hot chocolate. Some cocoas; some instant coffees; instant iced teas; powdered fruit drinks (read labels).   CONDIMENTS / MISCELLANEOUS Soy sauce, carob  powder, olives, gravy made with water, baker's cocoa, pickles, pure seasonings and spices, wine, pure monosodium glutamate, catsup, mustard. Some chewing gums, chocolate, some cocoas. Certain antibiotics and vitamin / mineral preparations. Spice blends if they contain milk products. MSG extender. Artificial sweeteners that contain lactose such as Equal (Nutra-Sweet) and Sweet 'n Low. Some nondairy creamers (read labels).

## 2019-01-21 NOTE — Progress Notes (Signed)
Subjective:    Patient ID: Alicia Tyler, female    DOB: 27-Aug-1970, 48 y.o.   MRN: [id number]  Monico Blitz, MD  HPI FEELS BELLY PAIN IN HER BACK, SHOULDER BLADES(TWISTING), LEFT SIDE CHEST/RIBS AND R SIDE(GB AREA: RUMBLING), LLQ(PRESSURE). FEELS CONSTANT(PRESSURE). WHEN SHE PASSES IT POO COMES OUT AND FEELS BETTER. TAKING ZOFRAN 2-3 TIMES A MONTH. TAKING LINZESS EVERY DAY PLUS STOOL SOFTENER WITH STIMULANT. BMs: 3 TIMES/DAY(#1-->#6-7). MILK: NONE, ICE CREAM: RARE, CHEESE: 1-2X/WEEK. APPETITE: EH, ENERGY LEVEL: BLAH. LAST CT JAN 2019-DILATED LOOPS OF SMALL BOWEL IN THE RUQ. TROUBLE SWALLOWING PILLS. HEARTBURN: 1-2X/WEEK.  PT DENIES FEVER, CHILLS, HEMATOCHEZIA, HEMATEMESIS, vomiting, melena, SHORTNESS OF BREATH, CHANGE IN BOWEL IN HABITS, OR constipation.  Past Medical History:  Diagnosis Date  . Anxiety   . BMI (body mass index) 20.0-29.01 Apr 2010 172 LBS  . Chronic back pain   . Degenerative disc disease, thoracic   . Gastroparesis   . GERD (gastroesophageal reflux disease)   . Headache(784.0)   . IBS (irritable bowel syndrome)   . Incontinence of feces    WEARS DEPENDS AT HS    . Neuropathy   . PONV (postoperative nausea and vomiting)   . Pseudoobstruction of colon 08/24/2014  . Seasonal allergies   . Serrated adenoma of colon 05/2010   Past Surgical History:  Procedure Laterality Date  . ABLATION     endometrial ablation  . APPENDECTOMY    . BACK SURGERY  05/14/2011   Lumbar Fusion and cage  . BIOPSY  06/22/2015   Procedure: BIOPSY;  Surgeon: Danie Binder, MD;  [facility];  Service: Endoscopy;;  gastric and esophageal bx's  . BREAST LUMPECTOMY     from the left breast 2003,  2016  . COLECTOMY N/A 08/24/2014   Procedure: TOTAL COLECTOMY;  Surgeon: Jamesetta So, MD;  Location: [facility];  Service: General;  Laterality: N/A;  . COLON SURGERY    . COLONOSCOPY  NOV 2011 SCREENING    HYPERPLASTIC RECTAL POLYP, SML IH, incomplete due to discomfort  . COLONOSCOPY   DEC 2011 w/ PROPOFOL   SERRATED ADENOMA, HYPERPLASTIC POLY[S[  . ESOPHAGEAL DILATION    . ESOPHAGEAL MANOMETRY N/A 12/29/2017   Procedure: ESOPHAGEAL MANOMETRY (EM);  Surgeon: Mauri Pole, MD;  [facility];  Service: Endoscopy;  Laterality: N/A;  . ESOPHAGOGASTRODUODENOSCOPY (EGD) WITH PROPOFOL N/A 06/22/2015   Procedure: ESOPHAGOGASTRODUODENOSCOPY (EGD) WITH PROPOFOL;  Surgeon: Danie Binder, MD;  [facility];  Service: Endoscopy;  Laterality: N/A;  . ESOPHAGOGASTRODUODENOSCOPY (EGD) WITH PROPOFOL N/A 01/21/2017   Procedure: ESOPHAGOGASTRODUODENOSCOPY (EGD) WITH PROPOFOL;  Surgeon: Danie Binder, MD;  [facility];  Service: Endoscopy;  Laterality: N/A;  9:30am  . FLEXIBLE SIGMOIDOSCOPY N/A 06/22/2015   Procedure: FLEXIBLE SIGMOIDOSCOPY WITH PROPOFOL;  Surgeon: Danie Binder, MD;  [facility];  Service: Endoscopy;  Laterality: N/A;  0830   . HARDWARE REMOVAL N/A 09/03/2016   Procedure: Removal of Lumbar five-Sacrum one  hardware with Metrex;  Surgeon: Kristeen Miss, MD;  Location: [place];  Service: Neurosurgery;  Laterality: N/A;  . KNEE SURGERY Bilateral    2 on righ-1 scope and one open; and one on left  . MOUTH SURGERY  07/21/2013  . OVARIAN CYST REMOVAL    . SAVORY DILATION N/A 01/21/2017   Procedure: SAVORY DILATION;  Surgeon: Danie Binder, MD;  [facility];  Service: Endoscopy;  Laterality: N/A;  . SMART PILL PROCEDURE N/A 07/04/2014  Procedure: SMART PILL PROCEDURE;  Surgeon: Danie Binder, MD;  [facility];  Service: Endoscopy;  Laterality: N/A;  800  . TUBAL LIGATION     Allergies  Allergen Reactions  . Aspirin Shortness Of Breath, Swelling and Rash  . Bee Venom Anaphylaxis       . Codeine Nausea And Vomiting and Rash  . Penicillins Other (See Comments)    Makes hair fall out       Current Outpatient Medications  Medication Sig    . baclofen (LIORESAL) 10 MG tablet Take 10 mg by mouth 3  (three) times daily.    . Cyanocobalamin (B-12 COMPLIANCE INJECTION IJ) Inject as directed every 30 (thirty) days. Tapering     . Docusate Sodium (STOOL SOFTENER LAXATIVE PO) Take by mouth daily.    . furosemide (LASIX) 20 MG tablet Take 20 mg by mouth 2 (two) times a week. Mondays and Fridays    . gabapentin (NEURONTIN) 300 MG capsule Take 600 mg by mouth 3 (three) times daily.     Marland Kitchen LINZESS 290 MCG CAPS capsule TAKE ONE CAPSULE BY MOUTH EVERY DAY 30 minutes BEFORE BREAKFAST    . loratadine (CLARITIN) 10 MG tablet Take 10 mg by mouth daily.      . meloxicam (MOBIC) 15 MG tablet Take 15 mg by mouth daily.    . Olopatadine HCl (PAZEO) 0.7 % SOLN Place 1 drop into both eyes daily.     Marland Kitchen omeprazole (PRILOSEC) 20 MG capsule Take one capsule 30 minutes before a meal once to twice daily.    . ondansetron (ZOFRAN) 4 MG tablet Take 4 mg by mouth every 4 (four) hours as needed for nausea or vomiting.    Marland Kitchen oxyCODONE (OXY IR/ROXICODONE) 5 MG immediate release tablet Take 5 mg by mouth 2 (two) times daily.     . SUMAtriptan (IMITREX) 100 MG tablet Take 100 mg by mouth every 2 (two) hours as needed for migraine or headache. May repeat in 2 hours if headache persists or recurs.    . topiramate (TOPAMAX) 50 MG tablet Take 50 mg by mouth 2 (two) times daily.     . Wheat Dextrin (BENEFIBER DRINK MIX PO) Take 15 mLs by mouth 2 (two) times daily. Mixed with water. Alternates with fiber gummies    .      . cyclobenzaprine (FLEXERIL) 10 MG tablet Take 10 mg by mouth 3 (three) times daily.    . fluticasone (FLONASE) 50 MCG/ACT nasal spray Place 1 spray into both nostrils 2 (two) times daily.    .       Review of Systems PER HPI OTHERWISE ALL SYSTEMS ARE NEGATIVE.    Objective:   Physical Exam Vitals signs reviewed.  Constitutional:      General: She is not in acute distress.    Appearance: She is well-developed.  HENT:     Head: Normocephalic and atraumatic.     Mouth/Throat:     Pharynx: No oropharyngeal  exudate.  Eyes:     General: No scleral icterus.    Pupils: Pupils are equal, round, and reactive to light.  Neck:     Musculoskeletal: Normal range of motion and neck supple.  Cardiovascular:     Rate and Rhythm: Normal rate and regular rhythm.     Heart sounds: Normal heart sounds.  Pulmonary:     Effort: Pulmonary effort is normal. No respiratory distress.     Breath sounds: Normal breath sounds.  Abdominal:  General: Bowel sounds are normal. There is no distension.     Palpations: Abdomen is soft.     Tenderness: There is abdominal tenderness. There is no guarding or rebound.     Comments: MILD RUQ TTP  Musculoskeletal:     Right lower leg: No edema.     Left lower leg: No edema.  Lymphadenopathy:     Cervical: No cervical adenopathy.  Neurological:     Mental Status: She is alert and oriented to person, place, and time.     Comments: NO  NEW FOCAL DEFICITS  Psychiatric:        Mood and Affect: Mood normal.        Thought Content: Thought content normal.       Assessment & Plan:

## 2019-01-21 NOTE — Assessment & Plan Note (Signed)
SYMPTOMS NOT IDEALLY CONTROLLED.  DIFFERENTIAL DIAGNOSIS INCLUDES: ADHESIONS, CONSTIPATION, LACTOSE INTOLERANCE.  DRINK WATER TO KEEP YOUR URINE LIGHT YELLOW.  FOLLOW A LACTOSE FREE DIET. SEE INFO BELOW. IF YOU CONSUME DAIRY, ADD LACTASE 3 PILLS WITH MEALS UP TO THREE TIMES A DAY.  TO REDUCE ABDOMINAL PAIN, LOSE WEIGHT, BOOST ENERGY AND GET BETTER SLEEP:     1. CONTINUE B12.  ADD VITAMIN D3 2000 IU DAILY IN THE MORNING.    2. FOLLOW RECOMMENDATIONS of DR. MARK HYMAN, "10-DAY DETOX DIET".  MEATS SHOULD BE BAKED, BROILED, OR BOILED. Avoid fried foods. DO NOT EAT FAST FOOD.    3. PRACTICE CHAIR YOGA FOR 15-30 MINS 3 OR 4 TIMES A WEEK.   4. Do not drink SODA, ENERGY DRINKS OR DIET SODA. AVOID HIGH FRUCTOSE CORN SYRUP. DO NOT chew SUGAR FREE GUM, OR USE ARTIFICIAL SWEETENERS. USE STEVIA IF NEEDED AS A SWEETENER.  FOLLOW UP IN 4 MOS.

## 2019-01-22 NOTE — Progress Notes (Signed)
ON RECALL  °

## 2019-01-26 NOTE — Progress Notes (Signed)
cc'ed to pcp °

## 2019-04-13 ENCOUNTER — Encounter: Payer: Self-pay | Admitting: Gastroenterology

## 2019-05-27 ENCOUNTER — Encounter: Payer: Self-pay | Admitting: *Deleted

## 2019-05-27 ENCOUNTER — Telehealth: Payer: Self-pay | Admitting: *Deleted

## 2019-05-27 ENCOUNTER — Ambulatory Visit (INDEPENDENT_AMBULATORY_CARE_PROVIDER_SITE_OTHER): Payer: Medicaid Other | Admitting: Gastroenterology

## 2019-05-27 ENCOUNTER — Encounter: Payer: Self-pay | Admitting: Gastroenterology

## 2019-05-27 ENCOUNTER — Other Ambulatory Visit: Payer: Self-pay

## 2019-05-27 DIAGNOSIS — K5901 Slow transit constipation: Secondary | ICD-10-CM | POA: Diagnosis not present

## 2019-05-27 DIAGNOSIS — D126 Benign neoplasm of colon, unspecified: Secondary | ICD-10-CM | POA: Diagnosis not present

## 2019-05-27 DIAGNOSIS — K3184 Gastroparesis: Secondary | ICD-10-CM

## 2019-05-27 DIAGNOSIS — R1011 Right upper quadrant pain: Secondary | ICD-10-CM | POA: Diagnosis not present

## 2019-05-27 NOTE — Progress Notes (Signed)
ON RECALL  °

## 2019-05-27 NOTE — Telephone Encounter (Signed)
PA for CT abd approved via [employer] website. Auth# [id number] dates 05/27/19-11/24/19

## 2019-05-27 NOTE — Progress Notes (Signed)
Subjective:    Patient ID: Alicia Tyler, female    DOB: Apr 16, 1971, 48 y.o.   MRN: [id number]  Monico Blitz, MD  HPI ALWAYS FEELS PAIN AND DISCOMFORT IN RUQ(DULL, SHARP). NO RADIATION. WORSE: SOMETIMES WITH FOOD. BETTER: TIME. NO BETTER WITH BM, PASSING GAS OR BURPING. SOUNDS LIKE LIQUID MOVING AROUND. SOUNDS LIKE WHEN HER SISTER HAD A COLOSTOMY. WHEN SHE PUSHES AND BARES DOWN TO HAVE A BM SHE FEELS A KNOT. HAS GAS BUILD UP IN RUQ. CAN HEAR AND FEEL IT. DOESN'T WANT A BAG. NAUSEATED IF FEELS FULL. GETS A LITTLE CONSTIPATED WITH PAIN MEDS. MISSING DOSE OF LINZESS DUE TO VARIATION IN MONTH DAYS AND HAS INCREASED ABDOMINAL PAIN/CONSTIPATION WHEN NOT ABLE TO TAKE MEDS ON A DAILY BASIS. BMs: EVERY DAY WITH MEDS(BSC #6-7), NO MEDS BSC(#1-2)       LOVING FLAMING HOT PRODUCTS. TROUBLE SWALLOWING PILLS. MAY FEEL HOT COFFEE WHEN SHE SWALLOWS. NO ASPIRIN, BC/GOODY POWDERS, IBUPROFEN/MOTRIN, OR NAPROXEN/ALEVE. ETOH: RARE.  PT DENIES FEVER, CHILLS, HEMATOCHEZIA, HEMATEMESIS, vomiting, melena, diarrhea, CHEST PAIN, SHORTNESS OF BREATH, CHANGE IN BOWEL IN HABITS, problems swallowing, OR heartburn or indigestion.  Past Medical History:  Diagnosis Date  . Anxiety   . BMI (body mass index) 20.0-29.01 Apr 2010 172 LBS  . Chronic back pain   . Degenerative disc disease, thoracic   . Gastroparesis   . GERD (gastroesophageal reflux disease)   . Headache(784.0)   . IBS (irritable bowel syndrome)   . Incontinence of feces    WEARS DEPENDS AT HS    . Neuropathy   . PONV (postoperative nausea and vomiting)   . Pseudoobstruction of colon 08/24/2014  . Seasonal allergies   . Serrated adenoma of colon 05/2010   Past Surgical History:  Procedure Laterality Date  . ABLATION     endometrial ablation  . APPENDECTOMY    . BACK SURGERY  05/14/2011   Lumbar Fusion and cage  . BIOPSY  06/22/2015   Procedure: BIOPSY;  Surgeon: Danie Binder, MD;  [facility];  Service: Endoscopy;;  gastric and esophageal  bx's  . BREAST LUMPECTOMY     from the left breast 2003,  2016  . COLECTOMY N/A 08/24/2014   Procedure: TOTAL COLECTOMY;  Surgeon: Jamesetta So, MD;  Location: [facility];  Service: General;  Laterality: N/A;  . COLON SURGERY    . COLONOSCOPY  NOV 2011 SCREENING    HYPERPLASTIC RECTAL POLYP, SML IH, incomplete due to discomfort  . COLONOSCOPY  DEC 2011 w/ PROPOFOL   SERRATED ADENOMA, HYPERPLASTIC POLY[S[  . ESOPHAGEAL DILATION    . ESOPHAGEAL MANOMETRY N/A 12/29/2017   Procedure: ESOPHAGEAL MANOMETRY (EM);  Surgeon: Mauri Pole, MD;  [facility];  Service: Endoscopy;  Laterality: N/A;  . ESOPHAGOGASTRODUODENOSCOPY (EGD) WITH PROPOFOL N/A 06/22/2015   Procedure: ESOPHAGOGASTRODUODENOSCOPY (EGD) WITH PROPOFOL;  Surgeon: Danie Binder, MD;  [facility];  Service: Endoscopy;  Laterality: N/A;  . ESOPHAGOGASTRODUODENOSCOPY (EGD) WITH PROPOFOL N/A 01/21/2017   Procedure: ESOPHAGOGASTRODUODENOSCOPY (EGD) WITH PROPOFOL;  Surgeon: Danie Binder, MD;  [facility];  Service: Endoscopy;  Laterality: N/A;  9:30am  . FLEXIBLE SIGMOIDOSCOPY N/A 06/22/2015   Procedure: FLEXIBLE SIGMOIDOSCOPY WITH PROPOFOL;  Surgeon: Danie Binder, MD;  [facility];  Service: Endoscopy;  Laterality: N/A;  0830   . HARDWARE REMOVAL N/A 09/03/2016   Procedure: Removal of Lumbar five-Sacrum one  hardware with Metrex;  Surgeon: Kristeen Miss, MD;  Location: [place];  Service:  Neurosurgery;  Laterality: N/A;  . KNEE SURGERY Bilateral    2 on righ-1 scope and one open; and one on left  . MOUTH SURGERY  07/21/2013  . OVARIAN CYST REMOVAL    . SAVORY DILATION N/A 01/21/2017   Procedure: SAVORY DILATION;  Surgeon: Danie Binder, MD;  [facility];  Service: Endoscopy;  Laterality: N/A;  . SMART PILL PROCEDURE N/A 07/04/2014   Procedure: SMART PILL PROCEDURE;  Surgeon: Danie Binder, MD;  [facility];  Service: Endoscopy;  Laterality: N/A;  800  . TUBAL  LIGATION     Allergies  Allergen Reactions  . Aspirin Shortness Of Breath, Swelling and Rash  . Bee Venom Anaphylaxis       . Codeine Nausea And Vomiting and Rash  . Penicillins Other (See Comments)       Current Outpatient Medications  Medication Sig    . baclofen (LIORESAL) 10 MG tablet Take 10 mg by mouth 3 (three) times daily.    . Cyanocobalamin (B-12 COMPLIANCE INJECTION IJ) Inject as directed every 30 (thirty) days. Tapering     . Docusate Sodium (STOOL SOFTENER LAXATIVE PO) Take by mouth daily.    . furosemide (LASIX) 20 MG tablet Take 20 mg by mouth 2 (two) times a week. Mondays and Fridays    . gabapentin (NEURONTIN) 300 MG capsule Take 600 mg by mouth 3 (three) times daily.     Marland Kitchen LINZESS 290 MCG CAPS capsule TAKE ONE CAPSULE BY MOUTH EVERY DAY 30 minutes BEFORE BREAKFAST    . loratadine (CLARITIN) 10 MG tablet Take 10 mg by mouth daily.      . meloxicam (MOBIC) 15 MG tablet Take 15 mg by mouth daily.    . Olopatadine HCl (PAZEO) 0.7 % SOLN Place 1 drop into both eyes daily.     Marland Kitchen omeprazole (PRILOSEC) 20 MG capsule Take one capsule 30 minutes before a meal once to twice daily.    . ondansetron (ZOFRAN) 4 MG tablet Take 4 mg by mouth every 4 (four) hours as needed for nausea or vomiting.    Marland Kitchen oxyCODONE (OXY IR/ROXICODONE) 5 MG immediate release tablet Take 5 mg by mouth 2 (two) times daily.     . SUMAtriptan (IMITREX) 100 MG tablet Take 100 mg by mouth every 2 (two) hours as needed for migraine or headache. May repeat in 2 hours if headache persists or recurs.    . topiramate (TOPAMAX) 50 MG tablet Take 50 mg by mouth 2 (two) times daily.     . Wheat Dextrin (BENEFIBER DRINK MIX PO) Take 15 mLs by mouth 2 (two) times daily. Mixed with water. Alternates with fiber gummies    . clindamycin (CLEOCIN) 150 MG capsule 3 tabs PO TID x 10 days (Patient not taking: Reported on 01/21/2019)    . cyclobenzaprine (FLEXERIL) 10 MG tablet Take 10 mg by mouth 3 (three) times daily.    .  fluticasone (FLONASE) 50 MCG/ACT nasal spray Place 1 spray into both nostrils 2 (two) times daily.    .       Review of Systems PER HPI OTHERWISE ALL SYSTEMS ARE NEGATIVE.    Objective:   Physical Exam Constitutional:      General: She is not in acute distress.    Appearance: Normal appearance.  HENT:     Mouth/Throat:     Comments: MASK IN PLACE Eyes:     General: No scleral icterus.    Pupils: Pupils are equal,  round, and reactive to light.  Neck:     Musculoskeletal: Normal range of motion.  Cardiovascular:     Rate and Rhythm: Normal rate and regular rhythm.     Pulses: Normal pulses.     Heart sounds: Normal heart sounds.  Pulmonary:     Effort: Pulmonary effort is normal.     Breath sounds: Normal breath sounds.  Abdominal:     General: Bowel sounds are normal.     Palpations: Abdomen is soft.     Tenderness: There is abdominal tenderness. There is no guarding or rebound.     Comments: MILD TTP IN THE RUQs.  Musculoskeletal:     Right lower leg: No edema.     Left lower leg: No edema.  Lymphadenopathy:     Cervical: No cervical adenopathy.  Skin:    General: Skin is warm and dry.  Neurological:     Mental Status: She is alert and oriented to person, place, and time.     Comments: NO  NEW FOCAL DEFICITS  Psychiatric:        Mood and Affect: Mood normal.     Comments: NORMAL AFFECT       Assessment & Plan:

## 2019-05-27 NOTE — Assessment & Plan Note (Signed)
SYMPTOMS FAIRLY WELL CONTROLLED.  CONTINUE TO MONITOR SYMPTOMS. FOLLOW UP IN 4 MOS.

## 2019-05-27 NOTE — Addendum Note (Signed)
Addended by: Cheron Every on: 05/27/2019 10:56 AM   Modules accepted: Orders

## 2019-05-27 NOTE — Assessment & Plan Note (Signed)
SYMPTOMS NOT IDEALLY CONTROLLED. DIFFERENTIAL DIAGNOSIS INCLUDES: ADHESIONS, LESS LIKELY DUE TO GASTROPARESIS, OR OCCULT MALIGNANCY.  COMPLETE LABS AND CT SCAN. FOLLOW UP IN 4 MOS.

## 2019-05-27 NOTE — Assessment & Plan Note (Addendum)
COLON RESECTION MAR 2016 WITH MULTIPLE SERRATED POLYPS.  NEXT FLEX SIG DEC 2021. YOUR SISTERS, BROTHERS, CHILDREN, AND PARENTS NEED TO HAVE A COLONOSCOPY STARTING AT THE AGE OF 40.

## 2019-05-27 NOTE — Patient Instructions (Addendum)
DRINK WATER TO KEEP YOUR URINE LIGHT YELLOW.  FOLLOW A GASTROPARESIS DIET. SEE HANDOUT.   CONTINUE OMEPRAZOLE.  TAKE 30 MINUTES PRIOR TO YOUR MEALS ONCE OR TWICE DAILY.  CONTINUE LINZESS.   COMPLETE CT SCAN AND LABS.  FOLLOW UP IN 4 MOS.   YOUR DAUGHTER NEEDS TO HAVE A COLONOSCOPY STARTING AT THE AGE OF 40.

## 2019-05-27 NOTE — Assessment & Plan Note (Signed)
SYMPTOMS FAIRLY WELL CONTROLLED.  DRINK WATER TO KEEP YOUR URINE LIGHT YELLOW. CONTINUE LINZESS. FOLLOW UP IN 4 MOS.

## 2019-05-28 LAB — COMPLETE METABOLIC PANEL WITH GFR
AG Ratio: 1.7 (calc) (ref 1.0–2.5)
ALT: 12 U/L (ref 6–29)
AST: 15 U/L (ref 10–35)
Albumin: 4.5 g/dL (ref 3.6–5.1)
Alkaline phosphatase (APISO): 74 U/L (ref 31–125)
BUN: 20 mg/dL (ref 7–25)
CO2: 22 mmol/L (ref 20–32)
Calcium: 10.4 mg/dL — ABNORMAL HIGH (ref 8.6–10.2)
Chloride: 110 mmol/L (ref 98–110)
Creat: 0.95 mg/dL (ref 0.50–1.10)
GFR, Est African American: 82 mL/min/{1.73_m2} (ref >=60)
GFR, Est Non African American: 70 mL/min/{1.73_m2} (ref >=60)
Globulin: 2.6 g/dL (calc) (ref 1.9–3.7)
Glucose, Bld: 90 mg/dL (ref 65–139)
Potassium: 5.1 mmol/L (ref 3.5–5.3)
Sodium: 143 mmol/L (ref 135–146)
Total Bilirubin: 0.4 mg/dL (ref 0.2–1.2)
Total Protein: 7.1 g/dL (ref 6.1–8.1)

## 2019-05-28 LAB — LIPASE: Lipase: 18 U/L (ref 7–60)

## 2019-05-31 ENCOUNTER — Other Ambulatory Visit: Payer: Self-pay

## 2019-06-01 ENCOUNTER — Other Ambulatory Visit: Payer: Self-pay

## 2019-06-01 NOTE — Progress Notes (Signed)
CC'ED TO PCP 

## 2019-06-02 ENCOUNTER — Other Ambulatory Visit: Payer: Self-pay

## 2019-06-02 ENCOUNTER — Ambulatory Visit (HOSPITAL_COMMUNITY)
Admission: RE | Admit: 2019-06-02 | Discharge: 2019-06-02 | Disposition: A | Discharge status: Home / Self Care | Payer: Medicaid Other | Source: Ambulatory Visit | Attending: Gastroenterology | Admitting: Gastroenterology

## 2019-06-02 DIAGNOSIS — R1011 Right upper quadrant pain: Secondary | ICD-10-CM | POA: Insufficient documentation

## 2019-06-02 MED ORDER — IOHEXOL 300 MG/ML  SOLN
100.0000 mL | Freq: Once | INTRAMUSCULAR | Status: AC | PRN
  Administered 2019-06-02: 10:00:00 100 mL via INTRAVENOUS

## 2019-06-14 ENCOUNTER — Other Ambulatory Visit: Payer: Self-pay | Admitting: Gastroenterology

## 2019-06-14 DIAGNOSIS — K219 Gastro-esophageal reflux disease without esophagitis: Secondary | ICD-10-CM

## 2019-06-14 DIAGNOSIS — K5900 Constipation, unspecified: Secondary | ICD-10-CM

## 2019-06-14 DIAGNOSIS — R1310 Dysphagia, unspecified: Secondary | ICD-10-CM

## 2019-07-16 ENCOUNTER — Ambulatory Visit (INDEPENDENT_AMBULATORY_CARE_PROVIDER_SITE_OTHER): Payer: Medicaid Other | Admitting: "Endocrinology

## 2019-07-16 ENCOUNTER — Other Ambulatory Visit: Payer: Self-pay

## 2019-07-16 ENCOUNTER — Encounter: Payer: Self-pay | Admitting: "Endocrinology

## 2019-07-16 NOTE — Progress Notes (Signed)
Endocrinology Consult Note       07/16/2019, 1:48 PM  [name] is a 49 y.o.-year-old female, referred by her  Monico Blitz, MD  , for evaluation for hypercalcemia.   Past Medical History:  Diagnosis Date  . Anxiety   . BMI (body mass index) 20.0-29.01 Apr 2010 172 LBS  . Chronic back pain   . Degenerative disc disease, thoracic   . Gastroparesis   . GERD (gastroesophageal reflux disease)   . Headache(784.0)   . IBS (irritable bowel syndrome)   . Incontinence of feces    WEARS DEPENDS AT HS    . Neuropathy   . PONV (postoperative nausea and vomiting)   . Pseudoobstruction of colon 08/24/2014  . Seasonal allergies   . Serrated adenoma of colon 05/2010    Past Surgical History:  Procedure Laterality Date  . ABLATION     endometrial ablation  . APPENDECTOMY    . BACK SURGERY  05/14/2011   Lumbar Fusion and cage  . BIOPSY  06/22/2015   Procedure: BIOPSY;  Surgeon: Danie Binder, MD;  [facility];  Service: Endoscopy;;  gastric and esophageal bx's  . BREAST LUMPECTOMY     from the left breast 2003,  2016  . COLECTOMY N/A 08/24/2014   Procedure: TOTAL COLECTOMY;  Surgeon: Jamesetta So, MD;  [facility];  Service: General;  Laterality: N/A;  . COLON SURGERY    . COLONOSCOPY  NOV 2011 SCREENING    HYPERPLASTIC RECTAL POLYP, SML IH, incomplete due to discomfort  . COLONOSCOPY  DEC 2011 w/ PROPOFOL   SERRATED ADENOMA, HYPERPLASTIC POLY[S[  . ESOPHAGEAL DILATION    . ESOPHAGEAL MANOMETRY N/A 12/29/2017   Procedure: ESOPHAGEAL MANOMETRY (EM);  Surgeon: Mauri Pole, MD;  [facility];  Service: Endoscopy;  Laterality: N/A;  . ESOPHAGOGASTRODUODENOSCOPY (EGD) WITH PROPOFOL N/A 06/22/2015   Procedure: ESOPHAGOGASTRODUODENOSCOPY (EGD) WITH PROPOFOL;  Surgeon: Danie Binder, MD;  [facility];  Service: Endoscopy;  Laterality: N/A;  . ESOPHAGOGASTRODUODENOSCOPY (EGD) WITH PROPOFOL  N/A 01/21/2017   Procedure: ESOPHAGOGASTRODUODENOSCOPY (EGD) WITH PROPOFOL;  Surgeon: Danie Binder, MD;  [facility];  Service: Endoscopy;  Laterality: N/A;  9:30am  . FLEXIBLE SIGMOIDOSCOPY N/A 06/22/2015   Procedure: FLEXIBLE SIGMOIDOSCOPY WITH PROPOFOL;  Surgeon: Danie Binder, MD;  [facility];  Service: Endoscopy;  Laterality: N/A;  0830   . HARDWARE REMOVAL N/A 09/03/2016   Procedure: Removal of Lumbar five-Sacrum one  hardware with Metrex;  Surgeon: Kristeen Miss, MD;  Location: [place];  Service: Neurosurgery;  Laterality: N/A;  . KNEE SURGERY Bilateral    2 on righ-1 scope and one open; and one on left  . MOUTH SURGERY  07/21/2013  . OVARIAN CYST REMOVAL    . SAVORY DILATION N/A 01/21/2017   Procedure: SAVORY DILATION;  Surgeon: Danie Binder, MD;  [facility];  Service: Endoscopy;  Laterality: N/A;  . SMART PILL PROCEDURE N/A 07/04/2014   Procedure: SMART PILL PROCEDURE;  Surgeon: Danie Binder, MD;  [facility];  Service: Endoscopy;  Laterality: N/A;  800  . TUBAL LIGATION  Social History   Tobacco Use  . Smoking status: Current Every Day Smoker    Packs/day: 1.00    Years: 36.00    Pack years: 36.00    Types: Cigarettes  . Smokeless tobacco: Never Used  . Tobacco comment: vapes CBD oil  Substance Use Topics  . Alcohol use: Yes    Comment: once a year when they go to the beach  . Drug use: No    Family History  Problem Relation Age of Onset  . Breast cancer Maternal Aunt   . Colon cancer Neg Hx   . Colon polyps Neg Hx     Outpatient Encounter Medications as of 07/16/2019  Medication Sig  . Cholecalciferol (VITAMIN D3) 50 MCG (2000 UT) TABS Take 1 tablet by mouth daily.  Marland Kitchen triamcinolone cream (KENALOG) 0.1 % Apply 1 application topically 2 (two) times daily. As needed  . baclofen (LIORESAL) 10 MG tablet Take 10 mg by mouth 3 (three) times daily.  . Cyanocobalamin (B-12 COMPLIANCE INJECTION IJ) Inject as directed  every 30 (thirty) days. Tapering   . Docusate Sodium (STOOL SOFTENER LAXATIVE PO) Take by mouth daily.  . furosemide (LASIX) 20 MG tablet Take 20 mg by mouth 2 (two) times a week. Mondays and Fridays  . gabapentin (NEURONTIN) 300 MG capsule Take 600 mg by mouth 3 (three) times daily.   Marland Kitchen LINZESS 290 MCG CAPS capsule TAKE ONE CAPSULE BY MOUTH EVERY DAY 30 minutes BEFORE BREAKFAST  . loratadine (CLARITIN) 10 MG tablet Take 10 mg by mouth daily.    . meloxicam (MOBIC) 15 MG tablet Take 15 mg by mouth daily.  . Olopatadine HCl (PAZEO) 0.7 % SOLN Place 1 drop into both eyes daily.   Marland Kitchen omeprazole (PRILOSEC) 20 MG capsule TAKE ONE CAPSULE BY MOUTH ONE OR TWO TIMES DAILY 30 MINUTES BEFORE A MEAL  . ondansetron (ZOFRAN) 4 MG tablet Take 4 mg by mouth every 4 (four) hours as needed for nausea or vomiting.  Marland Kitchen oxyCODONE (OXY IR/ROXICODONE) 5 MG immediate release tablet Take 5 mg by mouth 2 (two) times daily.   . SUMAtriptan (IMITREX) 100 MG tablet Take 100 mg by mouth every 2 (two) hours as needed for migraine or headache. May repeat in 2 hours if headache persists or recurs.  . topiramate (TOPAMAX) 50 MG tablet Take 50 mg by mouth 2 (two) times daily.   . Wheat Dextrin (BENEFIBER DRINK MIX PO) Take 15 mLs by mouth 2 (two) times daily. Mixed with water. Alternates with fiber gummies  . [DISCONTINUED] clindamycin (CLEOCIN) 150 MG capsule 3 tabs PO TID x 10 days (Patient not taking: Reported on 01/21/2019)  . [DISCONTINUED] cyclobenzaprine (FLEXERIL) 10 MG tablet Take 10 mg by mouth 3 (three) times daily.  . [DISCONTINUED] fluticasone (FLONASE) 50 MCG/ACT nasal spray Place 1 spray into both nostrils 2 (two) times daily.  . [DISCONTINUED] metoCLOPramide (REGLAN) 10 MG tablet Take 1 tablet (10 mg total) by mouth every 6 (six) hours as needed for nausea (or headache). (Patient not taking: Reported on 01/21/2019)   No facility-administered encounter medications on file as of 07/16/2019.    Allergies  Allergen  Reactions  . Aspirin Shortness Of Breath, Swelling and Rash  . Bee Venom Anaphylaxis       . Codeine Nausea And Vomiting and Rash  . Penicillins Other (See Comments)    Makes hair fall out   Has patient had a PCN reaction causing immediate rash, facial/tongue/throat swelling, SOB or lightheadedness with hypotension: unknown  Has patient had a PCN reaction causing severe rash involving mucus membranes or skin necrosis: unknown Has patient had a PCN reaction that required hospitalization unknown Has patient had a PCN reaction occurring within the last 10 years: No If all of the above answers are "NO", then may proceed with Cephalosporin use.       HPI  Alicia Tyler was diagnosed with hypercalcemia in December 2020.  Patient has no previously known history of parathyroid, pituitary, adrenal dysfunctions; no family history of such dysfunctions. -Review of herreferral package of most recent labs reveals calcium of 10.4, no corresponding PTH was measured.    -She reports that she has osteoporosis on vitamin D supplement.  She denies any history of fragility fractures.  Her bone density is not available to review today.  She denies any history of nephrolithiasis.    No history of CKD.  she is not on HCTZ or other thiazide therapy. she is not on calcium supplements,  she eats dairy and green, leafy, vegetables on average amounts.  she does not have a family history of hypercalcemia, pituitary tumors, thyroid cancer, or osteoporosis.  She has family history of thyroid dysfunction in one of her children, and other extended family members.   ROS:  Constitutional: no weight gain/loss, no fatigue, no subjective hyperthermia, no subjective hypothermia Eyes: no blurry vision, no xerophthalmia ENT: no sore throat, no nodules palpated in throat, no dysphagia/odynophagia, no hoarseness Cardiovascular: no Chest Pain, no Shortness of Breath, no palpitations, no leg swelling Respiratory: no cough, no  shortness of breath  Gastrointestinal: no Nausea/Vomiting/Diarhhea Musculoskeletal: no muscle/joint aches Skin: no rashes Neurological: no tremors, no numbness, no tingling, no dizziness Psychiatric: no depression, no anxiety  PE: BP 123/87   Pulse 76   Ht 5\' 3"  (1.6 m)   Wt 162 lb 6.4 oz (73.7 kg)   LMP 07/22/2011 Comment: tubal ligation/ablation  BMI 28.77 kg/m , Body mass index is 28.77 kg/m. Wt Readings from Last 3 Encounters:  07/16/19 162 lb 6.4 oz (73.7 kg)  05/27/19 168 lb (76.2 kg)  01/21/19 170 lb 12.8 oz (77.5 kg)    Constitutional:   Her BMI is 29, not in acute distress, normal state of mind Eyes: PERRLA, EOMI, no exophthalmos ENT: moist mucous membranes, no gross thyromegaly, no gross cervical lymphadenopathy Cardiovascular: normal precordial activity, Regular Rate and Rhythm, no Murmur/Rubs/Gallops Respiratory:  adequate breathing efforts, no gross chest deformity, Clear to auscultation bilaterally Gastrointestinal: abdomen soft, Non -tender, No distension, Bowel Sounds present Musculoskeletal: no gross deformities, strength intact in all four extremities Skin: moist, warm, no rashes Neurological: no tremor with outstretched hands, Deep tendon reflexes normal in bilateral lower extremities.     CMP ( most recent) CMP     Component Value Date/Time   NA 143 05/27/2019 1128   K 5.1 05/27/2019 1128   CL 110 05/27/2019 1128   CO2 22 05/27/2019 1128   GLUCOSE 90 05/27/2019 1128   BUN 20 05/27/2019 1128   CREATININE 0.95 05/27/2019 1128   CALCIUM 10.4 (H) 05/27/2019 1128   PROT 7.1 05/27/2019 1128   ALBUMIN 2.7 (L) 09/04/2014 0508   AST 15 05/27/2019 1128   ALT 12 05/27/2019 1128   ALKPHOS 36 (L) 09/04/2014 0508   BILITOT 0.4 05/27/2019 1128   GFRNONAA 70 05/27/2019 1128   GFRAA 82 05/27/2019 1128     Diabetic Labs (most recent): Lab Results  Component Value Date   HGBA1C 5.8 (H) 07/25/2014    Lab Results  Component  Value Date   TSH 1.50  06/05/2016   TSH 1.693 07/25/2014      Assessment: 1. Hypercalcemia  2.  Vitamin D deficiency  Plan: Patient has had one measurement of mild hypercalcemia 10.4 without corresponding PTH.   I have reviewed her previous records in epic showing normal calcium between 8.4-10.3 from March 2016-December 2020.    - Patient also  has vitamin D deficiency, which she is getting supplement for.   -Not clear for the etiology of hypercalcemia at this time. -She reports history of osteoporosis not on bisphosphonates.  I would request for a copy of her bone density to review.    No other apparent complications of hypercalcemia: No history of fragility fractures, no nephrolithiasis.     No abdominal pain, no major mood disorders, no bone pain.  - I discussed with the patient about the physiology of calcium and parathyroid hormone, and possible  effects of  increased PTH/ Calcium , including kidney stones, cardiac dysrhythmias, osteoporosis, abdominal pain, etc.   - The work up so far is not sufficient to reach a conclusion for definitive therapy.  she  needs more studies to confirm and classify the parathyroid dysfunction she may have. I will proceed to obtain   intact PTH/calcium, serum magnesium, serum phosphorus.  She will also need vitamin D panel, and thyroid function test. -If she is confirmed to have hypercalcemia persisting, and will be essential to obtain 24-hour urine calcium/creatinine to rule out the rare but important cause of mild elevation in calcium and PTH- [place] ( Familial Hypocalciuric Hypercalcemia), which may not require any active intervention.  She is advised to continue her vitamin D supplement with 2000 units daily.  She is advised to avoid any over-the-counter calcium supplements until work-up is complete.  I did not initiate any prescriptions today.    - Time spent with the patient: 60 minutes, of which >50% was spent in obtaining information about her symptoms, reviewing her  previous labs, evaluations, and treatments, counseling her about her hypercalcemia, and developing a plan to confirm the diagnosis and long term treatment as necessary.  Please refer to " Patient Self Inventory" in the Media  tab for reviewed elements of pertinent patient history.  [employer] participated in the discussions, expressed understanding, and voiced agreement with the above plans.  All questions were answered to her satisfaction. she is encouraged to contact clinic should she have any questions or concerns prior to her return visit.  - Return in about 1 week (around 07/23/2019), or phone, for Labs Today- Non-Fasting Ok.   Glade Lloyd, MD [facility] [address] [place], [place] [address] Phone: [phone]  Fax: [phone]    This note was partially dictated with voice recognition software. Similar sounding words can be transcribed inadequately or may not  be corrected upon review.  07/16/2019, 1:48 PM

## 2019-07-19 LAB — PTH, INTACT AND CALCIUM
Calcium: 10.9 mg/dL — ABNORMAL HIGH (ref 8.6–10.2)
PTH: 26 pg/mL (ref 14–64)

## 2019-07-19 LAB — T4, FREE: Free T4: 1 ng/dL (ref 0.8–1.8)

## 2019-07-19 LAB — TSH: TSH: 1.09 mIU/L

## 2019-07-19 LAB — MAGNESIUM: Magnesium: 2.2 mg/dL (ref 1.5–2.5)

## 2019-07-19 LAB — PHOSPHORUS: Phosphorus: 4.9 mg/dL — ABNORMAL HIGH (ref 2.5–4.5)

## 2019-07-27 ENCOUNTER — Ambulatory Visit (INDEPENDENT_AMBULATORY_CARE_PROVIDER_SITE_OTHER): Payer: Medicaid Other | Admitting: "Endocrinology

## 2019-07-27 ENCOUNTER — Encounter: Payer: Self-pay | Admitting: "Endocrinology

## 2019-07-27 NOTE — Progress Notes (Signed)
Endocrinology Telehealth Visit Follow up Note -During COVID -19 Pandemic  I connected with Alicia Tyler on 07/27/2019   by telephone and verified that I am speaking with the correct person using two identifiers. Alicia Tyler, May 03, 1971. she has verbally consented to this visit. All issues noted in this document were discussed and addressed. The format was not optimal for physical exam.     07/27/2019, 4:33 PM  Alicia Tyler is a 49 y.o.-year-old female, referred by her  Monico Blitz, MD  , for evaluation for hypercalcemia.   Past Medical History:  Diagnosis Date  . Anxiety   . BMI (body mass index) 20.0-29.01 Apr 2010 172 LBS  . Chronic back pain   . Degenerative disc disease, thoracic   . Gastroparesis   . GERD (gastroesophageal reflux disease)   . Headache(784.0)   . IBS (irritable bowel syndrome)   . Incontinence of feces    WEARS DEPENDS AT HS    . Neuropathy   . PONV (postoperative nausea and vomiting)   . Pseudoobstruction of colon 08/24/2014  . Seasonal allergies   . Serrated adenoma of colon 05/2010    Past Surgical History:  Procedure Laterality Date  . ABLATION     endometrial ablation  . APPENDECTOMY    . BACK SURGERY  05/14/2011   Lumbar Fusion and cage  . BIOPSY  06/22/2015   Procedure: BIOPSY;  Surgeon: Danie Binder, MD;  [facility];  Service: Endoscopy;;  gastric and esophageal bx's  . BREAST LUMPECTOMY     from the left breast 2003,  2016  . COLECTOMY N/A 08/24/2014   Procedure: TOTAL COLECTOMY;  Surgeon: Jamesetta So, MD;  [facility];  Service: General;  Laterality: N/A;  . COLON SURGERY    . COLONOSCOPY  NOV 2011 SCREENING    HYPERPLASTIC RECTAL POLYP, SML IH, incomplete due to discomfort  . COLONOSCOPY  DEC 2011 w/ PROPOFOL   SERRATED ADENOMA, HYPERPLASTIC POLY[S[  . ESOPHAGEAL DILATION    . ESOPHAGEAL MANOMETRY N/A 12/29/2017   Procedure:  ESOPHAGEAL MANOMETRY (EM);  Surgeon: Mauri Pole, MD;  [facility];  Service: Endoscopy;  Laterality: N/A;  . ESOPHAGOGASTRODUODENOSCOPY (EGD) WITH PROPOFOL N/A 06/22/2015   Procedure: ESOPHAGOGASTRODUODENOSCOPY (EGD) WITH PROPOFOL;  Surgeon: Danie Binder, MD;  [facility];  Service: Endoscopy;  Laterality: N/A;  . ESOPHAGOGASTRODUODENOSCOPY (EGD) WITH PROPOFOL N/A 01/21/2017   Procedure: ESOPHAGOGASTRODUODENOSCOPY (EGD) WITH PROPOFOL;  Surgeon: Danie Binder, MD;  [facility];  Service: Endoscopy;  Laterality: N/A;  9:30am  . FLEXIBLE SIGMOIDOSCOPY N/A 06/22/2015   Procedure: FLEXIBLE SIGMOIDOSCOPY WITH PROPOFOL;  Surgeon: Danie Binder, MD;  [facility];  Service: Endoscopy;  Laterality: N/A;  0830   . HARDWARE REMOVAL N/A 09/03/2016   Procedure: Removal of Lumbar five-Sacrum one  hardware with Metrex;  Surgeon: Kristeen Miss, MD;  Location: [place];  Service: Neurosurgery;  Laterality: N/A;  . KNEE SURGERY Bilateral    2 on  righ-1 scope and one open; and one on left  . MOUTH SURGERY  07/21/2013  . OVARIAN CYST REMOVAL    . SAVORY DILATION N/A 01/21/2017   Procedure: SAVORY DILATION;  Surgeon: Danie Binder, MD;  [facility];  Service: Endoscopy;  Laterality: N/A;  . SMART PILL PROCEDURE N/A 07/04/2014   Procedure: SMART PILL PROCEDURE;  Surgeon: Danie Binder, MD;  [facility];  Service: Endoscopy;  Laterality: N/A;  800  . TUBAL LIGATION      Social History   Tobacco Use  . Smoking status: Current Every Day Smoker    Packs/day: 1.00    Years: 36.00    Pack years: 36.00    Types: Cigarettes  . Smokeless tobacco: Never Used  . Tobacco comment: vapes CBD oil  Substance Use Topics  . Alcohol use: Yes    Comment: once a year when they go to the beach  . Drug use: No    Family History  Problem Relation Age of Onset  . Breast cancer Maternal Aunt   . Colon cancer Neg Hx   . Colon polyps Neg Hx      Outpatient Encounter Medications as of 07/27/2019  Medication Sig  . baclofen (LIORESAL) 10 MG tablet Take 10 mg by mouth 3 (three) times daily.  . Cholecalciferol (VITAMIN D3) 50 MCG (2000 UT) TABS Take 1 tablet by mouth daily.  . Cyanocobalamin (B-12 COMPLIANCE INJECTION IJ) Inject as directed every 30 (thirty) days. Tapering   . Docusate Sodium (STOOL SOFTENER LAXATIVE PO) Take by mouth daily.  . furosemide (LASIX) 20 MG tablet Take 20 mg by mouth 2 (two) times a week. Mondays and Fridays  . gabapentin (NEURONTIN) 300 MG capsule Take 600 mg by mouth 3 (three) times daily.   Marland Kitchen LINZESS 290 MCG CAPS capsule TAKE ONE CAPSULE BY MOUTH EVERY DAY 30 minutes BEFORE BREAKFAST  . loratadine (CLARITIN) 10 MG tablet Take 10 mg by mouth daily.    . meloxicam (MOBIC) 15 MG tablet Take 15 mg by mouth daily.  . Olopatadine HCl (PAZEO) 0.7 % SOLN Place 1 drop into both eyes daily.   Marland Kitchen omeprazole (PRILOSEC) 20 MG capsule TAKE ONE CAPSULE BY MOUTH ONE OR TWO TIMES DAILY 30 MINUTES BEFORE A MEAL  . ondansetron (ZOFRAN) 4 MG tablet Take 4 mg by mouth every 4 (four) hours as needed for nausea or vomiting.  Marland Kitchen oxyCODONE (OXY IR/ROXICODONE) 5 MG immediate release tablet Take 5 mg by mouth 2 (two) times daily.   . SUMAtriptan (IMITREX) 100 MG tablet Take 100 mg by mouth every 2 (two) hours as needed for migraine or headache. May repeat in 2 hours if headache persists or recurs.  . topiramate (TOPAMAX) 50 MG tablet Take 50 mg by mouth 2 (two) times daily.   Marland Kitchen triamcinolone cream (KENALOG) 0.1 % Apply 1 application topically 2 (two) times daily. As needed  . Wheat Dextrin (BENEFIBER DRINK MIX PO) Take 15 mLs by mouth 2 (two) times daily. Mixed with water. Alternates with fiber gummies   No facility-administered encounter medications on file as of 07/27/2019.    Allergies  Allergen Reactions  . Aspirin Shortness Of Breath, Swelling and Rash  . Bee Venom Anaphylaxis       . Codeine Nausea And Vomiting and Rash   . Penicillins Other (See Comments)    Makes hair fall out   Has patient had a PCN reaction causing immediate rash, facial/tongue/throat swelling, SOB or lightheadedness with  hypotension: unknown Has patient had a PCN reaction causing severe rash involving mucus membranes or skin necrosis: unknown Has patient had a PCN reaction that required hospitalization unknown Has patient had a PCN reaction occurring within the last 10 years: No If all of the above answers are "NO", then may proceed with Cephalosporin use.       HPI  Alicia Tyler was diagnosed with hypercalcemia in December 2020.  She is being engaged in telehealth via telephone in follow-up after her repeat labs.  Patient has no previously known history of parathyroid, pituitary, adrenal dysfunctions; no family history of such dysfunctions. -Review of herreferral package of most recent labs reveals calcium of 10.4. Her repeat labs show mild hypercalcemia of 10.9, associated with normal PTH of 26.   -She reports that she has osteoporosis on vitamin D supplement.  She denies any history of fragility fractures.  Her bone density is not available to review today.  She denies any history of nephrolithiasis.    No history of CKD.  she is not on HCTZ or other thiazide therapy. she is not on calcium supplements,  she eats dairy and green, leafy, vegetables on average amounts.  she does not have a family history of hypercalcemia, pituitary tumors, thyroid cancer, or osteoporosis.  She has family history of thyroid dysfunction in one of her children, and other extended family members.   ROS: Limited as above.  PE: LMP 07/22/2011 Comment: tubal ligation/ablation, There is no height or weight on file to calculate BMI. Wt Readings from Last 3 Encounters:  07/16/19 162 lb 6.4 oz (73.7 kg)  05/27/19 168 lb (76.2 kg)  01/21/19 170 lb 12.8 oz (77.5 kg)      CMP ( most recent) CMP     Component Value Date/Time   NA 143 05/27/2019  1128   K 5.1 05/27/2019 1128   CL 110 05/27/2019 1128   CO2 22 05/27/2019 1128   GLUCOSE 90 05/27/2019 1128   BUN 20 05/27/2019 1128   CREATININE 0.95 05/27/2019 1128   CALCIUM 10.9 (H) 07/16/2019 1031   PROT 7.1 05/27/2019 1128   ALBUMIN 2.7 (L) 09/04/2014 0508   AST 15 05/27/2019 1128   ALT 12 05/27/2019 1128   ALKPHOS 36 (L) 09/04/2014 0508   BILITOT 0.4 05/27/2019 1128   GFRNONAA 70 05/27/2019 1128   GFRAA 82 05/27/2019 1128     Diabetic Labs (most recent): Lab Results  Component Value Date   HGBA1C 5.8 (H) 07/25/2014    Lab Results  Component Value Date   TSH 1.09 07/16/2019   TSH 1.50 06/05/2016   TSH 1.693 07/25/2014   FREET4 1.0 07/16/2019      Assessment: 1. Hypercalcemia  2.  Vitamin D deficiency  Plan: Patient has now 2 measurements of mild hypercalcemia 10.4 and 10.9 associated with normal PTH of 26.  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  Can still be secondary to early primary hyperparathyroidism.   She will not need surgical intervention at this time.   - Patient   has vitamin D deficiency, which she is getting supplement for.    -She reports history of osteoporosis not on bisphosphonates.  We will request for a copy of her bone density to review.    No other apparent complications of hypercalcemia: No history of fragility fractures, no nephrolithiasis.     No abdominal pain, no major mood disorders, no bone pain. - The work up so far is not sufficient to reach a conclusion  for definitive therapy.  -She will be put on observation only.  She will have 24-hour urine calcium/creatinine before her next visit in 6 months along with repeat intact PTH/calcium.   -  24-hour urine calcium/creatinine to rule out the rare but important cause of mild elevation in calcium and PTH- [place] ( Familial Hypocalciuric Hypercalcemia), which may not require any active intervention.  She is advised to continue her vitamin D supplement with  2000 units daily.  She is advised to avoid any over-the-counter calcium supplements until work-up is complete.  I did not initiate any prescriptions today.     - Time spent on this patient care encounter:  25 minutes of which 50% was spent in  counseling and the rest reviewing  her current and  previous labs / studies and medications  doses and developing a plan for long term care. Alicia Tyler  participated in the discussions, expressed understanding, and voiced agreement with the above plans.  All questions were answered to her satisfaction. she is encouraged to contact clinic should she have any questions or concerns prior to her return visit.  - Return in about 6 months (around 01/24/2020) for 24 Hours Urine Calcium and Creatinine, Follow up with Pre-visit Labs.   Glade Lloyd, MD [facility] [address] [place], [place] [address] Phone: [phone]  Fax: [phone]    This note was partially dictated with voice recognition software. Similar sounding words can be transcribed inadequately or may not  be corrected upon review.  07/27/2019, 4:33 PM

## 2019-08-24 ENCOUNTER — Other Ambulatory Visit: Payer: Self-pay | Admitting: *Deleted

## 2019-08-24 ENCOUNTER — Telehealth: Payer: Self-pay | Admitting: *Deleted

## 2019-08-24 NOTE — Telephone Encounter (Signed)
Lab orders released and mailed to pt. 

## 2019-08-31 LAB — COMPREHENSIVE METABOLIC PANEL
AG Ratio: 1.6 (calc) (ref 1.0–2.5)
ALT: 10 U/L (ref 6–29)
AST: 13 U/L (ref 10–35)
Albumin: 4.7 g/dL (ref 3.6–5.1)
Alkaline phosphatase (APISO): 72 U/L (ref 31–125)
BUN: 20 mg/dL (ref 7–25)
CO2: 25 mmol/L (ref 20–32)
Calcium: 10.2 mg/dL (ref 8.6–10.2)
Chloride: 107 mmol/L (ref 98–110)
Creat: 1.08 mg/dL (ref 0.50–1.10)
Globulin: 3 g/dL (calc) (ref 1.9–3.7)
Glucose, Bld: 100 mg/dL (ref 65–139)
Potassium: 4.9 mmol/L (ref 3.5–5.3)
Sodium: 141 mmol/L (ref 135–146)
Total Bilirubin: 0.4 mg/dL (ref 0.2–1.2)
Total Protein: 7.7 g/dL (ref 6.1–8.1)

## 2019-09-06 ENCOUNTER — Encounter: Payer: Self-pay | Admitting: Gastroenterology

## 2019-09-12 ENCOUNTER — Other Ambulatory Visit: Payer: Self-pay | Admitting: Gastroenterology

## 2019-10-20 ENCOUNTER — Encounter: Payer: Self-pay | Admitting: Gastroenterology

## 2019-10-20 ENCOUNTER — Other Ambulatory Visit: Payer: Self-pay

## 2019-10-20 ENCOUNTER — Ambulatory Visit (INDEPENDENT_AMBULATORY_CARE_PROVIDER_SITE_OTHER): Payer: Medicaid Other | Admitting: Gastroenterology

## 2019-10-20 DIAGNOSIS — K219 Gastro-esophageal reflux disease without esophagitis: Secondary | ICD-10-CM

## 2019-10-20 DIAGNOSIS — K3184 Gastroparesis: Secondary | ICD-10-CM | POA: Diagnosis not present

## 2019-10-20 DIAGNOSIS — R1310 Dysphagia, unspecified: Secondary | ICD-10-CM

## 2019-10-20 DIAGNOSIS — D126 Benign neoplasm of colon, unspecified: Secondary | ICD-10-CM

## 2019-10-20 DIAGNOSIS — K581 Irritable bowel syndrome with constipation: Secondary | ICD-10-CM

## 2019-10-20 NOTE — Progress Notes (Signed)
Subjective:    Patient ID: Alicia Tyler, female    DOB: 05-23-1971, 49 y.o.   MRN: [id number]  Monico Blitz, MD   HPI Being treated for a UTI AND THRUSH. DIDN'T GET BETTER WITH NYSTATIN. NOW ON HER PILLS. GETTING CORTISONE SHOTS IN NECK, R SHOULDER x2, AND LEFT WRIST. PAIN IN RIGHT NECK AND FEELS RIGHT UPPER ARM IS SWOLLEN.  BOWELS DOING PRETTY GOOD.NOW HAVING BURNING SENSATION IN MOUTH AND LIPS. BURNING SENSATION FROM THROAT TO STOMACH. LASTED A COUPLE OF HOURS. ASSOCIATED WITH NAUSEA. BMs: #6-7, EVERY DAY(2-3X/DAY). SOMETIMES DREADS SWALLOWING HER PILLS.  PT DENIES FEVER, CHILLS, HEMATOCHEZIA, HEMATEMESIS, vomiting, melena, SHORTNESS OF BREATH, CHANGE IN BOWEL IN HABITS, constipation, problems swallowing, OR heartburn or indigestion.  Past Medical History:  Diagnosis Date  . Anxiety   . BMI (body mass index) 20.0-29.01 Apr 2010 172 LBS  . Chronic back pain   . Degenerative disc disease, thoracic   . Gastroparesis   . GERD (gastroesophageal reflux disease)   . Headache(784.0)   . IBS (irritable bowel syndrome)   . Incontinence of feces    WEARS DEPENDS AT HS    . Neuropathy   . PONV (postoperative nausea and vomiting)   . Pseudoobstruction of colon 08/24/2014  . Seasonal allergies   . Serrated adenoma of colon 05/2010   Past Surgical History:  Procedure Laterality Date  . ABLATION     endometrial ablation  . APPENDECTOMY    . BACK SURGERY  05/14/2011   Lumbar Fusion and cage  . BIOPSY  06/22/2015   Procedure: BIOPSY;  Surgeon: Danie Binder, MD;  Location: [facility];  Service: Endoscopy;;  gastric and esophageal bx's  . BREAST LUMPECTOMY     from the left breast 2003,  2016  . COLECTOMY N/A 08/24/2014   Procedure: TOTAL COLECTOMY;  Surgeon: Jamesetta So, MD;  Location: [facility];  Service: General;  Laterality: N/A;  . COLON SURGERY    . COLONOSCOPY  NOV 2011 SCREENING    HYPERPLASTIC RECTAL POLYP, SML IH, incomplete due to discomfort  . COLONOSCOPY  DEC 2011 w/  PROPOFOL   SERRATED ADENOMA, HYPERPLASTIC POLY[S[  . ESOPHAGEAL DILATION    . ESOPHAGEAL MANOMETRY N/A 12/29/2017   Procedure: ESOPHAGEAL MANOMETRY (EM);  Surgeon: Mauri Pole, MD;  Location: [facility];  Service: Endoscopy;  Laterality: N/A;  . ESOPHAGOGASTRODUODENOSCOPY (EGD) WITH PROPOFOL N/A 06/22/2015   Procedure: ESOPHAGOGASTRODUODENOSCOPY (EGD) WITH PROPOFOL;  Surgeon: Danie Binder, MD;  Location: [facility];  Service: Endoscopy;  Laterality: N/A;  . ESOPHAGOGASTRODUODENOSCOPY (EGD) WITH PROPOFOL N/A 01/21/2017   Procedure: ESOPHAGOGASTRODUODENOSCOPY (EGD) WITH PROPOFOL;  Surgeon: Danie Binder, MD;  Location: [facility];  Service: Endoscopy;  Laterality: N/A;  9:30am  . FLEXIBLE SIGMOIDOSCOPY N/A 06/22/2015   Procedure: FLEXIBLE SIGMOIDOSCOPY WITH PROPOFOL;  Surgeon: Danie Binder, MD;  Location: [facility];  Service: Endoscopy;  Laterality: N/A;  0830   . HARDWARE REMOVAL N/A 09/03/2016   Procedure: Removal of Lumbar five-Sacrum one  hardware with Metrex;  Surgeon: Kristeen Miss, MD;  Location: [place];  Service: Neurosurgery;  Laterality: N/A;  . KNEE SURGERY Bilateral    2 on righ-1 scope and one open; and one on left  . MOUTH SURGERY  07/21/2013  . OVARIAN CYST REMOVAL    . SAVORY DILATION N/A 01/21/2017   Procedure: SAVORY DILATION;  Surgeon: Danie Binder, MD;  Location: [facility];  Service: Endoscopy;  Laterality: N/A;  . SMART PILL  PROCEDURE N/A 07/04/2014   Procedure: SMART PILL PROCEDURE;  Surgeon: Danie Binder, MD;  Location: [facility];  Service: Endoscopy;  Laterality: N/A;  800  . TUBAL LIGATION      Allergies  Allergen Reactions  . Aspirin Shortness Of Breath, Swelling and Rash  . Bee Venom Anaphylaxis       . Codeine Nausea And Vomiting and Rash  . Penicillins Other (See Comments)         Current Outpatient Medications  Medication Sig    . baclofen (LIORESAL) 10 MG tablet Take 20 mg by mouth 3 (three) times daily.     .  Cholecalciferol (VITAMIN D3) 50 MCG (2000 UT) TABS Take 1 tablet by mouth daily.    . Cyanocobalamin (B-12 COMPLIANCE INJECTION IJ) Inject as directed every 30 (thirty) days. Tapering     . Docusate Sodium (STOOL SOFTENER LAXATIVE PO) Take by mouth daily.    . furosemide (LASIX) 20 MG tablet Take 20 mg by mouth 2 (two) times a week. Mondays and Fridays    . gabapentin (NEURONTIN) 300 MG capsule Take 600 mg by mouth 3 (three) times daily.     Marland Kitchen LINZESS 290 MCG CAPS capsule TAKE ONE CAPSULE BY MOUTH EVERY DAY 30 minutes BEFORE BREAKFAST    . loratadine (CLARITIN) 10 MG tablet Take 10 mg by mouth daily.      . meloxicam (MOBIC) 15 MG tablet Take 15 mg by mouth daily.    . Olopatadine HCl (PAZEO) 0.7 % SOLN Place 1 drop into both eyes daily.     Marland Kitchen omeprazole (PRILOSEC) 20 MG capsule TAKE ONE CAPSULE BY MOUTH ONE OR TWO TIMES DAILY 30 MINUTES BEFORE A MEAL    . ondansetron (ZOFRAN) 4 MG tablet Take 4 mg by mouth every 4 (four) hours as needed for nausea or vomiting.    Marland Kitchen oxyCODONE (OXY IR/ROXICODONE) 5 MG immediate release tablet Take 5 mg by mouth 2 (two) times daily.     . SUMAtriptan (IMITREX) 100 MG tablet Take 100 mg by mouth every 2 (two) hours as needed for migraine or headache. May repeat in 2 hours if headache persists or recurs.    . topiramate (TOPAMAX) 50 MG tablet Take 50 mg by mouth 2 (two) times daily.     Marland Kitchen triamcinolone cream (KENALOG) 0.1 % Apply 1 application topically 2 (two) times daily. As needed    . Wheat Dextrin (BENEFIBER DRINK MIX PO) Take 15 mLs by mouth 2 (two) times daily. Mixed with water. Alternates with fiber gummies     Review of Systems PER HPI OTHERWISE ALL SYSTEMS ARE NEGATIVE.     Objective:   Physical Exam Constitutional:      General: She is not in acute distress.    Appearance: Normal appearance.  HENT:     Mouth/Throat:     Comments: MASK IN PLACE Eyes:     General: No scleral icterus.    Pupils: Pupils are equal, round, and reactive to light.    Cardiovascular:     Rate and Rhythm: Normal rate and regular rhythm.     Pulses: Normal pulses.     Heart sounds: Normal heart sounds.  Pulmonary:     Effort: Pulmonary effort is normal.     Breath sounds: Normal breath sounds.  Abdominal:     General: Bowel sounds are normal.     Palpations: Abdomen is soft.     Tenderness: There is no abdominal tenderness.  Musculoskeletal:  Cervical back: Normal range of motion.     Right lower leg: No edema.     Left lower leg: No edema.  Lymphadenopathy:     Cervical: No cervical adenopathy.  Skin:    General: Skin is warm and dry.  Neurological:     Mental Status: She is alert and oriented to person, place, and time.     Comments: NO  NEW FOCAL DEFICITS  Psychiatric:        Mood and Affect: Mood normal.     Comments: NORMAL AFFECT       Assessment & Plan:

## 2019-10-20 NOTE — Assessment & Plan Note (Signed)
TROUBLE WITH PILLS.  CONTINUE TO MONITOR SYMPTOMS. FOLLOW UP IN 6 MOS.

## 2019-10-20 NOTE — Patient Instructions (Addendum)
DRINK WATER TO KEEP YOUR URINE LIGHT YELLOW.  FOLLOW A HIGH FIBER DIET. AVOID ITEMS THAT CAUSE BLOATING & GAS.  AVOID REFLUX TRIGGERS. SEE INFO BELOW.  CONTINUE OMEPRAZOLE.  TAKE 30 MINUTES PRIOR TO YOUR MEALS TWICE DAILY.  USE ZOFRAN AS NEEDED FOR NAUSEA.  FOLLOW UP IN 6 MOS.    Lifestyle and home remedies TO CONTROL HEARTBURN You may eliminate or reduce the frequency of heartburn by making the following lifestyle changes:  . Control your weight. Being overweight is a major risk factor for heartburn and GERD. Excess pounds put pressure on your abdomen, pushing up your stomach and causing acid to back up into your esophagus.   . Eat smaller meals. 4 TO 6 MEALS A DAY. This reduces pressure on the lower esophageal sphincter, helping to prevent the valve from opening and acid from washing back into your esophagus.   Dolphus Jenny your belt. Clothes that fit tightly around your waist put pressure on your abdomen and the lower esophageal sphincter.   . Eliminate heartburn triggers. Everyone has specific triggers. Common triggers such as fatty or fried foods, spicy food, tomato sauce, carbonated beverages, alcohol, chocolate, mint, garlic, onion, caffeine and nicotine may make heartburn worse.   Marland Kitchen Avoid stooping or bending. Tying your shoes is OK. Bending over for longer periods to weed your garden isn't, especially soon after eating.   . Don't lie down after a meal. Wait at least three to four hours after eating before going to bed, and don't lie down right after eating.   Alternative medicine . Several home remedies exist for treating GERD, but they provide only temporary relief. They include drinking baking soda (sodium bicarbonate) added to water or drinking other fluids such as baking soda mixed with cream of tartar and water. . Although these liquids create temporary relief by neutralizing, washing away or buffering acids, eventually they aggravate the situation by adding gas and fluid to  your stomach, increasing pressure and causing more acid reflux. Further, adding more sodium to your diet may increase your blood pressure and add stress to your heart, and excessive bicarbonate ingestion can alter the acid-base balance in your body.

## 2019-10-20 NOTE — Assessment & Plan Note (Signed)
SERRATE ADENOMAS REMOVED IN DEC 2016.   NEXT FLEX SIG WITH ENEMA IN [place] 2021. DOESN'T WANT TO HAVE IT ON HER BIRTHDAY.

## 2019-10-20 NOTE — Assessment & Plan Note (Signed)
SYMPTOMS FAIRLY WELL CONTROLLED.  DRINK WATER TO KEEP YOUR URINE LIGHT YELLOW. USE ZOFRAN AS NEEDED FOR NAUSEA. FOLLOW UP IN 6 MOS.

## 2019-10-20 NOTE — Progress Notes (Signed)
Cc'ed to pcp °

## 2019-10-20 NOTE — Assessment & Plan Note (Signed)
SYMPTOMS FAIRLY WELL CONTROLLED AFTER SUBTOTAL COLECTOMY.  CONTINUE HIGH FIBER DIET, GUMMIES, AND LINZESS. FOLLOW UP IN 6 MOS.

## 2019-10-20 NOTE — Assessment & Plan Note (Signed)
SYMPTOMS FAIRLY WELL CONTROLLED.  DRINK WATER TO KEEP YOUR URINE LIGHT YELLOW. AVOID REFLUX TRIGGERS.  CONTINUE OMEPRAZOLE.  TAKE 30 MINUTES PRIOR TO YOUR MEALS TWICE DAILY. USE ZOFRAN AS NEEDED FOR NAUSEA. FOLLOW UP IN 6 MOS.

## 2020-01-25 ENCOUNTER — Ambulatory Visit: Payer: Medicaid Other | Admitting: "Endocrinology

## 2020-04-19 ENCOUNTER — Ambulatory Visit: Payer: Medicaid Other | Admitting: Gastroenterology

## 2020-04-20 ENCOUNTER — Ambulatory Visit (INDEPENDENT_AMBULATORY_CARE_PROVIDER_SITE_OTHER): Payer: Medicaid Other | Admitting: Nurse Practitioner

## 2020-04-20 ENCOUNTER — Other Ambulatory Visit: Payer: Self-pay

## 2020-04-20 ENCOUNTER — Encounter: Payer: Self-pay | Admitting: Nurse Practitioner

## 2020-04-20 VITALS — BP 133/87 | HR 87 | Temp 97.3°F | Ht 63.0 in | Wt 141.8 lb

## 2020-04-20 DIAGNOSIS — K581 Irritable bowel syndrome with constipation: Secondary | ICD-10-CM

## 2020-04-20 DIAGNOSIS — K219 Gastro-esophageal reflux disease without esophagitis: Secondary | ICD-10-CM

## 2020-04-20 DIAGNOSIS — D126 Benign neoplasm of colon, unspecified: Secondary | ICD-10-CM

## 2020-04-20 DIAGNOSIS — K3184 Gastroparesis: Secondary | ICD-10-CM

## 2020-04-20 NOTE — Progress Notes (Signed)
Referring Provider: Monico Blitz, MD Primary Care Physician:  Monico Blitz, MD Primary GI:  Dr. Abbey Chatters  Chief Complaint  Patient presents with  . Abdominal Pain    had gallbladder removed in June and also had abcess (see notes in care everywhere). she is still healing from surgery    HPI:   Alicia Tyler is a 49 y.o. female who presents for follow-up.  The patient was last seen in our office 10/20/2019 for IBS constipation type, GERD, gastroparesis, dysphagia, history of serrated adenomas of the colon.  At that time noted she was being treated for UTI and thrush.  Ongoing arthritis pain.  Bowels doing pretty good.  Burning sensation from throat to stomach and in the mouth and lips that lasted couple hours and is associated with nausea.  Bowel movements [place] 6-7 every day for about 2-3 stools a day.  History of serrated adenomatous removed in December 2016 and next due for flex sig with enema in December 2021 (history of subtotal colectomy).  Noted pill dysphagia, recommend monitoring symptoms.  Gastroparesis symptoms fairly well controlled.  GERD symptoms fairly well controlled.  Recommend avoid triggers, use Zofran as needed, continue omeprazole, follow-up in 6 months.  Today she states she is doing okay overall. She states she is having abdominal pain. She had cholecystectomy in June 2021 at [facility]. Reviewed notes in [place] documenting acute cholecystitis with lap cholecystectomy that was converted to open laparotomy when a small bowel perforation was discovered (and subsequently repaired). Three weeks post-op developed abscess and had a drain placed (subsequently removed). Still with some wound drainage that's been cultured and was on antibiotics. States she is still healing from surgery, still with pain but improved. Some occasional nausea. Denies vomiting, hematochezia, melena, fever, chills. She has lost weight with her acute illness (objectively she is down 12 lbs since  last OV with [place]). Weight is starting to come back up (per the patient). Tolerating a diet well, eating small amounts. Denies URI or flu-like symptoms. Denies loss of sense of taste or smell. The patient has not received COVID-19 vaccination(s). Denies chest pain, dyspnea, dizziness, lightheadedness, syncope, near syncope. Denies any other upper or lower GI symptoms.  Past Medical History:  Diagnosis Date  . Anxiety   . BMI (body mass index) 20.0-29.01 Apr 2010 172 LBS  . Chronic back pain   . Degenerative disc disease, thoracic   . Gastroparesis   . GERD (gastroesophageal reflux disease)   . Headache(784.0)   . IBS (irritable bowel syndrome)   . Incontinence of feces    WEARS DEPENDS AT HS    . Neuropathy   . PONV (postoperative nausea and vomiting)   . Pseudoobstruction of colon 08/24/2014  . Seasonal allergies   . Serrated adenoma of colon 05/2010    Past Surgical History:  Procedure Laterality Date  . ABLATION     endometrial ablation  . APPENDECTOMY    . BACK SURGERY  05/14/2011   Lumbar Fusion and cage  . BIOPSY  06/22/2015   Procedure: BIOPSY;  Surgeon: Danie Binder, MD;  [facility]: [facility];  Service: Endoscopy;;  gastric and esophageal bx's  . BREAST LUMPECTOMY     from the left breast 2003,  2016  . COLECTOMY N/A 08/24/2014   Procedure: TOTAL COLECTOMY;  Surgeon: Jamesetta So, MD;  Location: [facility];  Service: General;  Laterality: N/A;  . COLON SURGERY    . COLONOSCOPY  NOV 2011 SCREENING  HYPERPLASTIC RECTAL POLYP, SML IH, incomplete due to discomfort  . COLONOSCOPY  DEC 2011 w/ PROPOFOL   SERRATED ADENOMA, HYPERPLASTIC POLY[S[  . ESOPHAGEAL DILATION    . ESOPHAGEAL MANOMETRY N/A 12/29/2017   Procedure: ESOPHAGEAL MANOMETRY (EM);  Surgeon: Mauri Pole, MD;  Location: [facility];  Service: Endoscopy;  Laterality: N/A;  . ESOPHAGOGASTRODUODENOSCOPY (EGD) WITH PROPOFOL N/A 06/22/2015   Procedure: ESOPHAGOGASTRODUODENOSCOPY (EGD) WITH PROPOFOL;  Surgeon:  Danie Binder, MD;  [facility];  Service: Endoscopy;  Laterality: N/A;  . ESOPHAGOGASTRODUODENOSCOPY (EGD) WITH PROPOFOL N/A 01/21/2017   Procedure: ESOPHAGOGASTRODUODENOSCOPY (EGD) WITH PROPOFOL;  Surgeon: Danie Binder, MD;  [facility];  Service: Endoscopy;  Laterality: N/A;  9:30am  . FLEXIBLE SIGMOIDOSCOPY N/A 06/22/2015   Procedure: FLEXIBLE SIGMOIDOSCOPY WITH PROPOFOL;  Surgeon: Danie Binder, MD;  [facility];  Service: Endoscopy;  Laterality: N/A;  0830   . HARDWARE REMOVAL N/A 09/03/2016   Procedure: Removal of Lumbar five-Sacrum one  hardware with Metrex;  Surgeon: Kristeen Miss, MD;  Location: [facility];  Service: Neurosurgery;  Laterality: N/A;  . KNEE SURGERY Bilateral    2 on righ-1 scope and one open; and one on left  . MOUTH SURGERY  07/21/2013  . OVARIAN CYST REMOVAL    . SAVORY DILATION N/A 01/21/2017   Procedure: SAVORY DILATION;  Surgeon: Danie Binder, MD;  [facility];  Service: Endoscopy;  Laterality: N/A;  . SMART PILL PROCEDURE N/A 07/04/2014   Procedure: SMART PILL PROCEDURE;  Surgeon: Danie Binder, MD;  [facility];  Service: Endoscopy;  Laterality: N/A;  800  . TUBAL LIGATION      Current Outpatient Medications  Medication Sig Dispense Refill  . Ascorbic Acid (VITAMIN C) 1000 MG tablet Take 1,000 mg by mouth in the morning and at bedtime.    . Cholecalciferol (VITAMIN D3) 50 MCG (2000 UT) TABS Take 1 tablet by mouth daily.    . Cyanocobalamin (B-12 COMPLIANCE INJECTION IJ) Inject as directed every 30 (thirty) days. Tapering     . escitalopram (LEXAPRO) 10 MG tablet Take 10 mg by mouth daily.    . furosemide (LASIX) 20 MG tablet Take 20 mg by mouth 2 (two) times a week. Mondays and Fridays    . gabapentin (NEURONTIN) 600 MG tablet Take 600 mg by mouth 3 (three) times daily.     Marland Kitchen LINZESS 290 MCG CAPS capsule TAKE ONE CAPSULE BY MOUTH EVERY DAY 30 minutes BEFORE BREAKFAST 90 capsule 3  . loratadine  (CLARITIN) 10 MG tablet Take 10 mg by mouth daily.      . meloxicam (MOBIC) 15 MG tablet Take 15 mg by mouth daily.    . methocarbamol (ROBAXIN) 750 MG tablet Take 750 mg by mouth in the morning and at bedtime.    . Olopatadine HCl (PAZEO) 0.7 % SOLN Place 1 drop into both eyes daily.     Marland Kitchen omeprazole (PRILOSEC) 20 MG capsule TAKE ONE CAPSULE BY MOUTH ONE OR TWO TIMES DAILY 30 MINUTES BEFORE A MEAL (Patient taking differently: Take 20 mg by mouth 2 (two) times daily before a meal. TAKE ONE CAPSULE BY MOUTH ONE OR TWO TIMES DAILY 30 MINUTES BEFORE A MEAL) 60 capsule 11  . ondansetron (ZOFRAN) 4 MG tablet Take 4 mg by mouth every 4 (four) hours as needed for nausea or vomiting.    Marland Kitchen oxyCODONE (OXY IR/ROXICODONE) 5 MG immediate release tablet Take 5 mg by mouth 2 (  two) times daily.     . SUMAtriptan (IMITREX) 100 MG tablet Take 100 mg by mouth every 2 (two) hours as needed for migraine or headache. May repeat in 2 hours if headache persists or recurs.    . topiramate (TOPAMAX) 50 MG tablet Take 50 mg by mouth 2 (two) times daily.     Marland Kitchen triamcinolone cream (KENALOG) 0.1 % Apply 1 application topically 2 (two) times daily. As needed    . Wheat Dextrin (BENEFIBER DRINK MIX PO) Take 15 mLs by mouth 2 (two) times daily. Mixed with water. Alternates with fiber gummies    . baclofen (LIORESAL) 10 MG tablet Take 20 mg by mouth 3 (three) times daily.  (Patient not taking: Reported on 04/20/2020)    . Docusate Sodium (STOOL SOFTENER LAXATIVE PO) Take by mouth daily. (Patient not taking: Reported on 04/20/2020)    . zinc gluconate 50 MG tablet Take 50 mg by mouth daily. (Patient not taking: Reported on 04/20/2020)     No current facility-administered medications for this visit.    Allergies as of 04/20/2020 - Review Complete 04/20/2020  Allergen Reaction Noted  . Aspirin Shortness Of Breath, Swelling, and Rash   . Bee venom Anaphylaxis 07/08/2013  . Codeine Nausea And Vomiting and Rash   . Penicillins Other  (See Comments)     Family History  Problem Relation Age of Onset  . Breast cancer Maternal Aunt   . Colon cancer Neg Hx   . Colon polyps Neg Hx     Social History   Socioeconomic History  . Marital status: Married    Spouse name: Not on file  . Number of children: Not on file  . Years of education: Not on file  . Highest education level: Not on file  Occupational History  . Not on file  Tobacco Use  . Smoking status: Current Every Day Smoker    Packs/day: 1.00    Years: 36.00    Pack years: 36.00    Types: Cigarettes  . Smokeless tobacco: Never Used  . Tobacco comment: vapes CBD oil  Substance and Sexual Activity  . Alcohol use: Yes    Comment: once a year when they go to the beach  . Drug use: No  . Sexual activity: Yes    Birth control/protection: Surgical, Post-menopausal    Comment: tubal  Other Topics Concern  . Not on file  Social History Narrative   DAUGHTER IS SEEING IMPAIRED. LEARNING TO PLAY VIOLIN.   Social Determinants of Health   Financial Resource Strain:   . Difficulty of Paying Living Expenses: Not on file  Food Insecurity:   . Worried About Charity fundraiser in the Last Year: Not on file  . Ran Out of Food in the Last Year: Not on file  Transportation Needs:   . Lack of Transportation (Medical): Not on file  . Lack of Transportation (Non-Medical): Not on file  Physical Activity:   . Days of Exercise per Week: Not on file  . Minutes of Exercise per Session: Not on file  Stress:   . Feeling of Stress : Not on file  Social Connections:   . Frequency of Communication with Friends and Family: Not on file  . Frequency of Social Gatherings with Friends and Family: Not on file  . Attends Religious Services: Not on file  . Active Member of Clubs or Organizations: Not on file  . Attends Archivist Meetings: Not on file  . Marital Status:  Not on file    Subjective: Review of Systems  Constitutional: Negative for chills, fever,  malaise/fatigue and weight loss.  HENT: Negative for congestion and sore throat.   Respiratory: Negative for cough and shortness of breath.   Cardiovascular: Negative for chest pain and palpitations.  Gastrointestinal: Positive for abdominal pain (incisional pain). Negative for blood in stool, constipation, diarrhea, heartburn, melena, nausea and vomiting.  Musculoskeletal: Negative for joint pain and myalgias.  Skin: Negative for rash.  Neurological: Negative for dizziness and weakness.  Endo/Heme/Allergies: Does not bruise/bleed easily.  Psychiatric/Behavioral: Negative for depression. The patient is not nervous/anxious.   All other systems reviewed and are negative.    Objective: BP 133/87   Pulse 87   Temp (!) 97.3 F (36.3 C)   Ht 5\' 3"  (1.6 m)   Wt 141 lb 12.8 oz (64.3 kg)   LMP 07/22/2011 Comment: tubal ligation/ablation  BMI 25.12 kg/m  Physical Exam Vitals and nursing note reviewed.  Constitutional:      General: She is not in acute distress.    Appearance: Normal appearance. She is well-developed and normal weight. She is not ill-appearing, toxic-appearing or diaphoretic.  HENT:     Head: Normocephalic and atraumatic.     Nose: No congestion or rhinorrhea.  Eyes:     General: No scleral icterus. Cardiovascular:     Rate and Rhythm: Normal rate and regular rhythm.     Heart sounds: Normal heart sounds.  Pulmonary:     Effort: Pulmonary effort is normal. No respiratory distress.     Breath sounds: Normal breath sounds.  Abdominal:     General: Bowel sounds are normal.     Palpations: Abdomen is soft. There is no hepatomegaly, splenomegaly or mass.     Tenderness: There is no abdominal tenderness. There is no guarding or rebound.     Hernia: No hernia is present.     Comments: Abdominal binder in place, dressing underneath is clean, dry, intact  Skin:    General: Skin is warm and dry.     Coloration: Skin is not jaundiced.     Findings: No rash.  Neurological:      General: No focal deficit present.     Mental Status: She is alert and oriented to person, place, and time.  Psychiatric:        Attention and Perception: Attention normal.        Mood and Affect: Mood normal.        Speech: Speech normal.        Behavior: Behavior normal.        Thought Content: Thought content normal.        Cognition and Memory: Cognition and memory normal.      Assessment:  Pleasant 49 year old female who presents for follow-up on multiple chronic GI issues including GERD, IBS constipation, gastroparesis, history of colon polyps due for flexible sigmoidoscopy in the near future.  Unfortunately, the summer she had a planned laparoscopic cholecystectomy for nonfunctional gallbladder/biliary dyskinesia that was converted to an open laparotomy due to small bowel perforation and peritonitis.  Three weeks postoperative she developed an abscess and had to have another drain placed.  She states she was septic in the ICU for 3 weeks.  Currently her primary symptoms are abdominal pain around her incision, slowly improving/healing.  Other GI issues are under control at this point.  GERD: No significant symptoms at this time.  Recommend she continue her current medicines  Irritable bowel syndrome constipation  type: Doing well overall.  She was on stool softener most operatively likely from pain medication.  She is not currently having any constipation.  I recommended she continue her other medicines and call us for any worsening or recurrent symptoms  Gastroparesis: Symptoms currently quiescent, although possibly masked by her postoperative symptoms.  Monitor for worsening and notify us of any  History of serrated adenoma status post subtotal colectomy.  She is due in December of this year for a flexible sigmoidoscopy.  However, she is concerned with all her recent surgical and illness issues about having her procedure in the next month or 2.  I am inclined to agree.  At this  point I will have her follow-up in 6 months and at that time we can discuss scheduling repeat flexible sigmoidoscopy for history of serrated adenoma.  Plan: 1. Continue current medications 2. Notify us of any worsening or recurrent symptoms 3. Follow-up in 6 months for routine follow-up visit and to schedule flexible sigmoidoscopy.    Thank you for allowing [place] to participate in the care of [place], DNP, AGNP-C Adult & Gerontological Nurse Practitioner [facility]   04/20/2020 2:04 PM   Disclaimer: This note was dictated with voice recognition software. Similar sounding words can inadvertently be transcribed and may not be corrected upon review.

## 2020-04-20 NOTE — Patient Instructions (Signed)
Your health issues we discussed today were:   GERD (reflux/heartburn), irritable bowel syndrome constipation type, gastroparesis: 1. I am glad you are doing overall pretty well 2. I am sorry you had to go through all your surgical issues and illnesses summer 3. As you continue to heal and recover, let us know if you have any recurrence of your chronic GI symptoms  Need for a repeat flexible sigmoidoscopy: 1. As we discussed, I agree that we should hold off for a little bit until you are fully healed 2. We will plan to discuss scheduling of your flexible sigmoidoscopy at your follow-up visit 3. Call us if you have any concerning symptoms such as blood in your stools, significant sudden weight loss, etc.  Overall I recommend:  1. Continue your other current medications 2. Return for follow-up in 6 months 3. Call us for any questions or concerns   At [facility] we value your feedback. You may receive a survey about your visit today. Please share your experience as we strive to create trusting relationships with our patients to provide genuine, compassionate, quality care.  We appreciate your understanding and patience as we review any laboratory studies, imaging, and other diagnostic tests that are ordered as we care for you. Our office policy is 5 business days for review of these results, and any emergent or urgent results are addressed in a timely manner for your best interest. If you do not hear from our office in 1 week, please contact us.   We also encourage the use of MyChart, which contains your medical information for your review as well. If you are not enrolled in this feature, an access code is on this after visit summary for your convenience. Thank you for allowing [place] to be involved in your care.  It was great to see you today!  I hope you have a great Fall!!

## 2020-06-12 ENCOUNTER — Other Ambulatory Visit: Payer: Self-pay | Admitting: Gastroenterology

## 2020-06-12 DIAGNOSIS — R1310 Dysphagia, unspecified: Secondary | ICD-10-CM

## 2020-06-12 DIAGNOSIS — K219 Gastro-esophageal reflux disease without esophagitis: Secondary | ICD-10-CM

## 2020-06-12 DIAGNOSIS — K5900 Constipation, unspecified: Secondary | ICD-10-CM

## 2020-09-13 ENCOUNTER — Other Ambulatory Visit: Payer: Self-pay | Admitting: Nurse Practitioner

## 2020-09-28 ENCOUNTER — Other Ambulatory Visit: Payer: Self-pay

## 2020-09-28 ENCOUNTER — Inpatient Hospital Stay (HOSPITAL_COMMUNITY)
Admission: EM | Admit: 2020-09-28 | Discharge: 2020-09-30 | DRG: 683 | Disposition: A | Discharge status: Home / Self Care | Payer: Medicaid Other | Attending: Internal Medicine | Admitting: Internal Medicine

## 2020-09-28 ENCOUNTER — Encounter (HOSPITAL_COMMUNITY): Payer: Self-pay | Admitting: *Deleted

## 2020-09-28 DIAGNOSIS — Z888 Allergy status to other drugs, medicaments and biological substances status: Secondary | ICD-10-CM | POA: Diagnosis not present

## 2020-09-28 DIAGNOSIS — E872 Acidosis, unspecified: Secondary | ICD-10-CM

## 2020-09-28 DIAGNOSIS — Z885 Allergy status to narcotic agent status: Secondary | ICD-10-CM

## 2020-09-28 DIAGNOSIS — Z79899 Other long term (current) drug therapy: Secondary | ICD-10-CM

## 2020-09-28 DIAGNOSIS — Z886 Allergy status to analgesic agent status: Secondary | ICD-10-CM

## 2020-09-28 DIAGNOSIS — E860 Dehydration: Secondary | ICD-10-CM | POA: Diagnosis present

## 2020-09-28 DIAGNOSIS — Z803 Family history of malignant neoplasm of breast: Secondary | ICD-10-CM | POA: Diagnosis not present

## 2020-09-28 DIAGNOSIS — F17210 Nicotine dependence, cigarettes, uncomplicated: Secondary | ICD-10-CM | POA: Diagnosis present

## 2020-09-28 DIAGNOSIS — Z880 Allergy status to penicillin: Secondary | ICD-10-CM | POA: Diagnosis not present

## 2020-09-28 DIAGNOSIS — G8929 Other chronic pain: Secondary | ICD-10-CM | POA: Diagnosis present

## 2020-09-28 DIAGNOSIS — R1110 Vomiting, unspecified: Secondary | ICD-10-CM

## 2020-09-28 DIAGNOSIS — N179 Acute kidney failure, unspecified: Principal | ICD-10-CM

## 2020-09-28 DIAGNOSIS — Z9049 Acquired absence of other specified parts of digestive tract: Secondary | ICD-10-CM

## 2020-09-28 DIAGNOSIS — R197 Diarrhea, unspecified: Secondary | ICD-10-CM | POA: Diagnosis not present

## 2020-09-28 DIAGNOSIS — G629 Polyneuropathy, unspecified: Secondary | ICD-10-CM | POA: Diagnosis present

## 2020-09-28 DIAGNOSIS — E785 Hyperlipidemia, unspecified: Secondary | ICD-10-CM | POA: Diagnosis present

## 2020-09-28 DIAGNOSIS — F419 Anxiety disorder, unspecified: Secondary | ICD-10-CM | POA: Diagnosis present

## 2020-09-28 DIAGNOSIS — E876 Hypokalemia: Secondary | ICD-10-CM | POA: Diagnosis present

## 2020-09-28 DIAGNOSIS — K219 Gastro-esophageal reflux disease without esophagitis: Secondary | ICD-10-CM | POA: Diagnosis present

## 2020-09-28 DIAGNOSIS — Z791 Long term (current) use of non-steroidal anti-inflammatories (NSAID): Secondary | ICD-10-CM | POA: Diagnosis not present

## 2020-09-28 DIAGNOSIS — F32A Depression, unspecified: Secondary | ICD-10-CM | POA: Diagnosis present

## 2020-09-28 DIAGNOSIS — M549 Dorsalgia, unspecified: Secondary | ICD-10-CM | POA: Diagnosis present

## 2020-09-28 DIAGNOSIS — R112 Nausea with vomiting, unspecified: Secondary | ICD-10-CM

## 2020-09-28 DIAGNOSIS — A080 Rotaviral enteritis: Secondary | ICD-10-CM | POA: Diagnosis present

## 2020-09-28 DIAGNOSIS — Z20822 Contact with and (suspected) exposure to covid-19: Secondary | ICD-10-CM | POA: Diagnosis present

## 2020-09-28 DIAGNOSIS — K529 Noninfective gastroenteritis and colitis, unspecified: Secondary | ICD-10-CM

## 2020-09-28 LAB — BLOOD GAS, VENOUS
Acid-base deficit: 8.2 mmol/L — ABNORMAL HIGH (ref 0.0–2.0)
Bicarbonate: 15.9 mmol/L — ABNORMAL LOW (ref 20.0–28.0)
FIO2: 21
O2 Saturation: 29.3 %
Patient temperature: 37
pCO2, Ven: 43.1 mmHg — ABNORMAL LOW (ref 44.0–60.0)
pH, Ven: 7.24 — ABNORMAL LOW (ref 7.250–7.430)
pO2, Ven: <31 mmHg — CL (ref 32.0–45.0)

## 2020-09-28 LAB — CBC WITH DIFFERENTIAL/PLATELET
Abs Immature Granulocytes: 0.02 10*3/uL (ref 0.00–0.07)
Basophils Absolute: 0 10*3/uL (ref 0.0–0.1)
Basophils Relative: 0 %
Eosinophils Absolute: 0 10*3/uL (ref 0.0–0.5)
Eosinophils Relative: 0 %
HCT: 56.8 % — ABNORMAL HIGH (ref 36.0–46.0)
Hemoglobin: 18.3 g/dL — ABNORMAL HIGH (ref 12.0–15.0)
Immature Granulocytes: 0 %
Lymphocytes Relative: 12 %
Lymphs Abs: 1.1 10*3/uL (ref 0.7–4.0)
MCH: 29.5 pg (ref 26.0–34.0)
MCHC: 32.2 g/dL (ref 30.0–36.0)
MCV: 91.5 fL (ref 80.0–100.0)
Monocytes Absolute: 1.1 10*3/uL — ABNORMAL HIGH (ref 0.1–1.0)
Monocytes Relative: 12 %
Neutro Abs: 6.8 10*3/uL (ref 1.7–7.7)
Neutrophils Relative %: 76 %
Platelets: 269 10*3/uL (ref 150–400)
RBC: 6.21 MIL/uL — ABNORMAL HIGH (ref 3.87–5.11)
RDW: 14.1 % (ref 11.5–15.5)
WBC: 9.1 10*3/uL (ref 4.0–10.5)
nRBC: 0 % (ref 0.0–0.2)

## 2020-09-28 LAB — URINALYSIS, ROUTINE W REFLEX MICROSCOPIC
Bilirubin Urine: NEGATIVE
Glucose, UA: NEGATIVE mg/dL
Hgb urine dipstick: NEGATIVE
Ketones, ur: 5 mg/dL — AB
Leukocytes,Ua: NEGATIVE
Nitrite: NEGATIVE
Protein, ur: 30 mg/dL — AB
Specific Gravity, Urine: 1.015 (ref 1.005–1.030)
pH: 5 (ref 5.0–8.0)

## 2020-09-28 LAB — SARS CORONAVIRUS 2 (TAT 6-24 HRS): SARS Coronavirus 2: NEGATIVE

## 2020-09-28 LAB — COMPREHENSIVE METABOLIC PANEL
ALT: 27 U/L (ref 0–44)
AST: 30 U/L (ref 15–41)
Albumin: 5.8 g/dL — ABNORMAL HIGH (ref 3.5–5.0)
Alkaline Phosphatase: 77 U/L (ref 38–126)
Anion gap: 21 — ABNORMAL HIGH (ref 5–15)
BUN: 60 mg/dL — ABNORMAL HIGH (ref 6–20)
CO2: 13 mmol/L — ABNORMAL LOW (ref 22–32)
Calcium: 10.4 mg/dL — ABNORMAL HIGH (ref 8.9–10.3)
Chloride: 101 mmol/L (ref 98–111)
Creatinine, Ser: 3.49 mg/dL — ABNORMAL HIGH (ref 0.44–1.00)
GFR, Estimated: 15 mL/min — ABNORMAL LOW (ref >60)
Glucose, Bld: 119 mg/dL — ABNORMAL HIGH (ref 70–99)
Potassium: 4.2 mmol/L (ref 3.5–5.1)
Sodium: 135 mmol/L (ref 135–145)
Total Bilirubin: 0.6 mg/dL (ref 0.3–1.2)
Total Protein: 10.1 g/dL — ABNORMAL HIGH (ref 6.5–8.1)

## 2020-09-28 LAB — LIPASE, BLOOD: Lipase: 26 U/L (ref 11–51)

## 2020-09-28 LAB — HIV ANTIBODY (ROUTINE TESTING W REFLEX): HIV Screen 4th Generation wRfx: NONREACTIVE

## 2020-09-28 MED ORDER — OXYCODONE HCL 5 MG PO TABS
5.0000 mg | ORAL_TABLET | Freq: Four times a day (QID) | ORAL | Status: DC | PRN
  Administered 2020-09-29 (×3): 5 mg via ORAL
  Filled 2020-09-28 (×4): qty 1

## 2020-09-28 MED ORDER — METOCLOPRAMIDE HCL 5 MG/ML IJ SOLN
10.0000 mg | Freq: Once | INTRAMUSCULAR | Status: AC
  Administered 2020-09-28: 10 mg via INTRAVENOUS
  Filled 2020-09-28: qty 2

## 2020-09-28 MED ORDER — ONDANSETRON HCL 4 MG/2ML IJ SOLN
4.0000 mg | Freq: Four times a day (QID) | INTRAMUSCULAR | Status: DC | PRN
  Administered 2020-09-30: 4 mg via INTRAVENOUS
  Filled 2020-09-28: qty 2

## 2020-09-28 MED ORDER — GABAPENTIN 300 MG PO CAPS
600.0000 mg | ORAL_CAPSULE | Freq: Three times a day (TID) | ORAL | Status: DC
  Administered 2020-09-28 – 2020-09-30 (×6): 600 mg via ORAL
  Filled 2020-09-28 (×6): qty 2

## 2020-09-28 MED ORDER — DICYCLOMINE HCL 10 MG/ML IM SOLN
10.0000 mg | Freq: Once | INTRAMUSCULAR | Status: AC
  Administered 2020-09-28: 10 mg via INTRAMUSCULAR
  Filled 2020-09-28: qty 2

## 2020-09-28 MED ORDER — HEPARIN SODIUM (PORCINE) 5000 UNIT/ML IJ SOLN
5000.0000 [IU] | Freq: Three times a day (TID) | INTRAMUSCULAR | Status: DC
  Administered 2020-09-28 – 2020-09-30 (×5): 5000 [IU] via SUBCUTANEOUS
  Filled 2020-09-28 (×5): qty 1

## 2020-09-28 MED ORDER — LORATADINE 10 MG PO TABS
10.0000 mg | ORAL_TABLET | Freq: Every day | ORAL | Status: DC
  Administered 2020-09-28 – 2020-09-30 (×3): 10 mg via ORAL
  Filled 2020-09-28 (×3): qty 1

## 2020-09-28 MED ORDER — ACETAMINOPHEN 325 MG PO TABS: 650.0000 mg | ORAL_TABLET | Freq: Four times a day (QID) | ORAL | Status: DC | PRN

## 2020-09-28 MED ORDER — METHOCARBAMOL 500 MG PO TABS
750.0000 mg | ORAL_TABLET | Freq: Every day | ORAL | Status: DC
  Administered 2020-09-28 – 2020-09-29 (×2): 750 mg via ORAL
  Filled 2020-09-28 (×2): qty 2

## 2020-09-28 MED ORDER — ESCITALOPRAM OXALATE 10 MG PO TABS
20.0000 mg | ORAL_TABLET | Freq: Every day | ORAL | Status: DC
  Administered 2020-09-28 – 2020-09-30 (×3): 20 mg via ORAL
  Filled 2020-09-28 (×3): qty 2

## 2020-09-28 MED ORDER — ONDANSETRON HCL 4 MG PO TABS: 4.0000 mg | ORAL_TABLET | Freq: Four times a day (QID) | ORAL | Status: DC | PRN

## 2020-09-28 MED ORDER — SODIUM BICARBONATE 8.4 % IV SOLN
50.0000 meq | Freq: Once | INTRAVENOUS | Status: AC
  Administered 2020-09-28: 50 meq via INTRAVENOUS

## 2020-09-28 MED ORDER — ACETAMINOPHEN 650 MG RE SUPP: 650.0000 mg | Freq: Four times a day (QID) | RECTAL | Status: DC | PRN

## 2020-09-28 MED ORDER — SODIUM CHLORIDE 0.9 % IV SOLN: INTRAVENOUS | Status: DC

## 2020-09-28 MED ORDER — ROSUVASTATIN CALCIUM 10 MG PO TABS
5.0000 mg | ORAL_TABLET | Freq: Every day | ORAL | Status: DC
  Administered 2020-09-28 – 2020-09-30 (×3): 5 mg via ORAL
  Filled 2020-09-28 (×3): qty 1

## 2020-09-28 MED ORDER — TOPIRAMATE 25 MG PO TABS
50.0000 mg | ORAL_TABLET | Freq: Two times a day (BID) | ORAL | Status: DC
  Administered 2020-09-28 – 2020-09-30 (×4): 50 mg via ORAL
  Filled 2020-09-28 (×4): qty 2

## 2020-09-28 MED ORDER — ONDANSETRON HCL 4 MG/2ML IJ SOLN
4.0000 mg | Freq: Once | INTRAMUSCULAR | Status: AC
  Administered 2020-09-28: 4 mg via INTRAVENOUS
  Filled 2020-09-28: qty 2

## 2020-09-28 MED ORDER — SODIUM CHLORIDE 0.9 % IV BOLUS
1000.0000 mL | Freq: Once | INTRAVENOUS | Status: AC
  Administered 2020-09-28: 1000 mL via INTRAVENOUS

## 2020-09-28 MED ORDER — PANTOPRAZOLE SODIUM 40 MG IV SOLR
40.0000 mg | Freq: Two times a day (BID) | INTRAVENOUS | Status: DC
  Administered 2020-09-28 – 2020-09-30 (×4): 40 mg via INTRAVENOUS
  Filled 2020-09-28 (×4): qty 40

## 2020-09-28 MED ORDER — ONDANSETRON HCL 4 MG/2ML IJ SOLN
4.0000 mg | Freq: Four times a day (QID) | INTRAMUSCULAR | Status: DC
  Administered 2020-09-28 – 2020-09-30 (×7): 4 mg via INTRAVENOUS
  Filled 2020-09-28 (×7): qty 2

## 2020-09-28 NOTE — ED Notes (Signed)
Pt attempted to provide sample but was unable to void. Will try again after pt has finished receiving her IVF.

## 2020-09-28 NOTE — H&P (Signed)
History and Physical  Hang Ammon OIB:[id number] DOB: 08/09/1970 DOA: 09/28/2020   PCP: Monico Blitz, MD   Patient coming from: Home  Chief Complaint: n/v/d  HPI:  Alicia Tyler is a 50 y.o. female with medical history of small bowel perforation status post subtotal colectomy, C. difficile colitis, chronic back pain, depression, hyperlipidemia, and migraine headaches presenting with 3-day history of nausea, vomiting, and diarrhea that began on 09/25/2020.  Patient states that her husband had a similar illness requiring hospitalization for about 3 days.  Apparently, the patient;s spouse was diagnosed rotavirus.  Patient denies any recent antibiotics or new medications.  He denies any fever, chills, headache, chest pain, shortness breath, coughing, hemoptysis.  She denies any hematemesis.  She has been vomiting so much that she has no dry heaving.  She has had too numerous to count loose bowel movements without hematochezia or melena.  She denies any dysuria, hematuria.  She denies any frank abdominal pain but has had intermittent abdominal cramping.  She denies any rashes or cellulitis.  She has not had any recent travels or eaten any exotic or undercooked foods.  She states that she contacted her PCP.  She was given a prescription for Lomotil and Phenergan.  Unfortunately she continued to have vomiting or diarrhea. In the emergency department, the patient was afebrile hemodynamically stable with oxygen saturation 99% on room air.  BMP showed a sodium 135, potassium 4.2, bicarbonate 13, serum creatinine 3.49.  LFTs were unremarkable.  WBC 9.1, hemoglobin 18.3, platelets 269,000.  The patient was given 2 L of fluid and admitted for further evaluation and treatment.  Assessment/Plan: Intractable nausea, vomiting, diarrhea -Suspect viral gastroenteritis -Stool for C. Difficile -Stool pathogen panel -Start Zofran around-the-clock -Protonix IV twice daily -Start IV fluids  Acute kidney  injury -Baseline creatinine 0.8-1.0 -Presented with serum creatinine 3.49 -Secondary to volume depletion -Continue IV fluids -Certainly, her chronic meloxicam use may be contributing -Discontinue meloxicam and furosemide  Hypercalcemia -Serum calcium mildly elevated at 10.4 -Suspect her vitamin D supplement may be contributing  Depression/anxiety -Start Lexapro  Hyperlipidemia -Continue Crestor  Chronic back pain -Restart oxycodone and gabapentin    Past Medical History:  Diagnosis Date  . Anxiety   . BMI (body mass index) 20.0-29.01 Apr 2010 172 LBS  . Chronic back pain   . Degenerative disc disease, thoracic   . Gastroparesis   . GERD (gastroesophageal reflux disease)   . Headache(784.0)   . IBS (irritable bowel syndrome)   . Incontinence of feces    WEARS DEPENDS AT HS    . Neuropathy   . PONV (postoperative nausea and vomiting)   . Pseudoobstruction of colon 08/24/2014  . Seasonal allergies   . Serrated adenoma of colon 05/2010   Past Surgical History:  Procedure Laterality Date  . ABLATION     endometrial ablation  . APPENDECTOMY    . BACK SURGERY  05/14/2011   Lumbar Fusion and cage  . BIOPSY  06/22/2015   Procedure: BIOPSY;  Surgeon: Danie Binder, MD;  Location: [facility];  Service: Endoscopy;;  gastric and esophageal bx's  . BREAST LUMPECTOMY     from the left breast 2003,  2016  . COLECTOMY N/A 08/24/2014   Procedure: TOTAL COLECTOMY;  Surgeon: Jamesetta So, MD;  Location: [facility];  Service: General;  Laterality: N/A;  . COLON SURGERY    . COLONOSCOPY  NOV 2011 SCREENING    HYPERPLASTIC RECTAL POLYP, SML IH, incomplete due  to discomfort  . COLONOSCOPY  DEC 2011 w/ PROPOFOL   SERRATED ADENOMA, HYPERPLASTIC POLY[S[  . ESOPHAGEAL DILATION    . ESOPHAGEAL MANOMETRY N/A 12/29/2017   Procedure: ESOPHAGEAL MANOMETRY (EM);  Surgeon: Mauri Pole, MD;  Location: [facility];  Service: Endoscopy;  Laterality: N/A;  . ESOPHAGOGASTRODUODENOSCOPY  (EGD) WITH PROPOFOL N/A 06/22/2015   Procedure: ESOPHAGOGASTRODUODENOSCOPY (EGD) WITH PROPOFOL;  Surgeon: Danie Binder, MD;  [facility]: [facility];  Service: Endoscopy;  Laterality: N/A;  . ESOPHAGOGASTRODUODENOSCOPY (EGD) WITH PROPOFOL N/A 01/21/2017   Procedure: ESOPHAGOGASTRODUODENOSCOPY (EGD) WITH PROPOFOL;  Surgeon: Danie Binder, MD;  [facility]: [facility];  Service: Endoscopy;  Laterality: N/A;  9:30am  . FLEXIBLE SIGMOIDOSCOPY N/A 06/22/2015   Procedure: FLEXIBLE SIGMOIDOSCOPY WITH PROPOFOL;  Surgeon: Danie Binder, MD;  Location: [facility];  Service: Endoscopy;  Laterality: N/A;  0830   . HARDWARE REMOVAL N/A 09/03/2016   Procedure: Removal of Lumbar five-Sacrum one  hardware with Metrex;  Surgeon: Kristeen Miss, MD;  Location: [facility];  Service: Neurosurgery;  Laterality: N/A;  . KNEE SURGERY Bilateral    2 on righ-1 scope and one open; and one on left  . MOUTH SURGERY  07/21/2013  . OVARIAN CYST REMOVAL    . SAVORY DILATION N/A 01/21/2017   Procedure: SAVORY DILATION;  Surgeon: Danie Binder, MD;  [facility]: [facility];  Service: Endoscopy;  Laterality: N/A;  . SMART PILL PROCEDURE N/A 07/04/2014   Procedure: SMART PILL PROCEDURE;  Surgeon: Danie Binder, MD;  Location: [facility];  Service: Endoscopy;  Laterality: N/A;  800  . TUBAL LIGATION     Social History:  reports that she has been smoking cigarettes. She has a 36.00 pack-year smoking history. She has never used smokeless tobacco. She reports current alcohol use. She reports that she does not use drugs.   Family History  Problem Relation Age of Onset  . Breast cancer Maternal Aunt   . Colon cancer Neg Hx   . Colon polyps Neg Hx      Allergies  Allergen Reactions  . Aspirin Shortness Of Breath, Swelling and Rash  . Bee Venom Anaphylaxis       . Eletriptan Anaphylaxis and Swelling  . Codeine Nausea And Vomiting and Rash  . Penicillins Other (See Comments)    Makes hair fall out   Has patient had a  PCN reaction causing immediate rash, facial/tongue/throat swelling, SOB or lightheadedness with hypotension: unknown Has patient had a PCN reaction causing severe rash involving mucus membranes or skin necrosis: unknown Has patient had a PCN reaction that required hospitalization unknown Has patient had a PCN reaction occurring within the last 10 years: No If all of the above answers are "NO", then may proceed with Cephalosporin use.       Prior to Admission medications   Medication Sig Start Date End Date Taking? Authorizing Provider  Ascorbic Acid (VITAMIN C) 1000 MG tablet Take 1,000 mg by mouth in the morning and at bedtime.   Yes [provider]  Cholecalciferol (VITAMIN D3) 50 MCG (2000 UT) TABS Take 1 tablet by mouth daily.   Yes [provider]  Cyanocobalamin (B-12 COMPLIANCE INJECTION IJ) Inject as directed every 30 (thirty) days. Tapering   Yes [provider]  diphenoxylate-atropine (LOMOTIL) 2.5-0.025 MG tablet Take 1 tablet by mouth 4 (four) times daily as needed for diarrhea or loose stools. 09/27/20  Yes [provider]  escitalopram (LEXAPRO) 10 MG tablet Take 20  mg by mouth daily.   Yes [provider]  furosemide (LASIX) 20 MG tablet Take 20 mg by mouth 2 (two) times a week. Mondays and Fridays   Yes [provider]  gabapentin (NEURONTIN) 600 MG tablet Take 600 mg by mouth 3 (three) times daily.    Yes [provider]  LINZESS 290 MCG CAPS capsule TAKE ONE CAPSULE BY MOUTH EVERY DAY 30 minutes BEFORE BREAKFAST Patient taking differently: Take 290 mcg by mouth daily before breakfast. 70mins before breakfast 09/15/20  Yes Carlis Stable, NP  loratadine (CLARITIN) 10 MG tablet Take 10 mg by mouth daily.   Yes [provider]  meloxicam (MOBIC) 15 MG tablet Take 15 mg by mouth daily.   Yes [provider]  methocarbamol (ROBAXIN) 750 MG tablet Take 750 mg by mouth in the morning and at bedtime.   Yes  [provider]  omeprazole (PRILOSEC) 20 MG capsule TAKE ONE CAPSULE BY MOUTH ONE OR TWO TIMES DAILY 30 MINUTES BEFORE A MEAL Patient taking differently: Take 20-40 mg by mouth daily. 06/13/20  Yes Aliene Altes S, PA-C  ondansetron (ZOFRAN) 4 MG tablet Take 4 mg by mouth every 4 (four) hours as needed for nausea or vomiting.   Yes [provider]  oxyCODONE (OXY IR/ROXICODONE) 5 MG immediate release tablet Take 5 mg by mouth every 8 (eight) hours as needed for moderate pain.   Yes [provider]  promethazine (PHENERGAN) 25 MG tablet Take 25 mg by mouth every 8 (eight) hours as needed for nausea/vomiting. 09/27/20  Yes [provider]  rosuvastatin (CRESTOR) 5 MG tablet Take 5 mg by mouth daily.   Yes [provider]  SUMAtriptan (IMITREX) 100 MG tablet Take 100 mg by mouth every 2 (two) hours as needed for migraine or headache. May repeat in 2 hours if headache persists or recurs.   Yes [provider]  topiramate (TOPAMAX) 50 MG tablet Take 50 mg by mouth 2 (two) times daily.   Yes [provider]  Zinc Sulfate 220 (50 Zn) MG TABS Take 1 tablet by mouth in the morning.   Yes [provider]    Review of Systems:  Constitutional:  No weight loss, night sweats, Fevers, chills, fatigue.  Head&Eyes: No headache.  No vision loss.  No eye pain or scotoma ENT:  No Difficulty swallowing,Tooth/dental problems,Sore throat,  No ear ache, post nasal drip,  Cardio-vascular:  No chest pain, Orthopnea, PND, swelling in lower extremities,  dizziness, palpitations  GI:  No  abdominal pain, , hematochezia, melena, heartburn, indigestion, Resp:  No shortness of breath with exertion or at rest. No cough. No coughing up of blood .No wheezing.No chest wall deformity  Skin:  no rash or lesions.  GU:  no dysuria, change in color of urine, no urgency or frequency. No flank pain.  Musculoskeletal:  No joint pain or swelling. No  decreased range of motion. No back pain.  Psych:  No change in mood or affect. No depression or anxiety. Neurologic: No headache, no dysesthesia, no focal weakness, no vision loss. No syncope  Physical Exam: Vitals:   09/28/20 1100 09/28/20 1130 09/28/20 1200 09/28/20 1230  BP: 126/79 118/80 118/77 122/84  Pulse: 88 86 87 93  Resp: 16 20 (!) 27 19  Temp:      TempSrc:      SpO2: 97% 98% 99% 99%  Weight:      Height:       General:  A&O x 3, NAD, nontoxic, pleasant/cooperative Head/Eye: No conjunctival hemorrhage, no icterus, [place]/AT, No nystagmus ENT:  No icterus,  No thrush, good dentition, no pharyngeal exudate Neck:  No masses, no lymphadenpathy, no bruits CV:  RRR, no rub, no gallop, no S3 Lung:  Diminished BS.  Bibasilar rales. No wheeze Abdomen: soft/NT, +BS, nondistended, no peritoneal signs Ext: No cyanosis, No rashes, No petechiae, No lymphangitis, No edema Neuro: CNII-XII intact, strength 4/5 in bilateral upper and lower extremities, no dysmetria  Labs on Admission:  Basic Metabolic Panel: Recent Labs  Lab 09/28/20 1044  NA 135  K 4.2  CL 101  CO2 13*  GLUCOSE 119*  BUN 60*  CREATININE 3.49*  CALCIUM 10.4*   Liver Function Tests: Recent Labs  Lab 09/28/20 1044  AST 30  ALT 27  ALKPHOS 77  BILITOT 0.6  PROT 10.1*  ALBUMIN 5.8*   Recent Labs  Lab 09/28/20 1044  LIPASE 26   No results for input(s): AMMONIA in the last 168 hours. CBC: Recent Labs  Lab 09/28/20 1044  WBC 9.1  NEUTROABS 6.8  HGB 18.3*  HCT 56.8*  MCV 91.5  PLT 269   Coagulation Profile: No results for input(s): INR, PROTIME in the last 168 hours. Cardiac Enzymes: No results for input(s): CKTOTAL, CKMB, CKMBINDEX, TROPONINI in the last 168 hours. BNP: Invalid input(s): POCBNP CBG: No results for input(s): GLUCAP in the last 168 hours. Urine analysis:    Component Value Date/Time   COLORURINE YELLOW 07/24/2017 [place] 07/24/2017 1633   LABSPEC 1.011  07/24/2017 1633   PHURINE 6.0 07/24/2017 1633   GLUCOSEU NEGATIVE 07/24/2017 1633   HGBUR NEGATIVE 07/24/2017 1633   BILIRUBINUR NEGATIVE 07/24/2017 1633   KETONESUR NEGATIVE 07/24/2017 1633   PROTEINUR NEGATIVE 07/24/2017 1633   UROBILINOGEN 1.0 09/03/2014 0058   NITRITE NEGATIVE 07/24/2017 1633   LEUKOCYTESUR SMALL (A) 07/24/2017 1633   Sepsis Labs: @LABRCNTIP (procalcitonin:4,lacticidven:4) )No results found for this or any previous visit (from the past 240 hour(s)).   Radiological Exams on Admission: No results found.      Time spent:60 minutes Code Status:   FULL Family Communication:  No Family at bedside Disposition Plan: expect 2 day hospitalization Consults called: none DVT Prophylaxis: [place] Lovenox/Heparin   SCDs  Orson Eva, DO  [facility] Pager [phone]  If 7PM-7AM, please contact night-coverage [email or web address] Password [email or web address] 09/28/2020, 1:24 PM

## 2020-09-28 NOTE — Treatment Plan (Addendum)
Pt to unit at this time via wheelchair, pt ambulated to bed at this time. Pt is AxOx4. Pt states last BM this am and voided prior to coming to unit. Pt has no noted skin issues. Pt states she would like to take her medications with next zofran dose ordered. Pt has clothing, phone and purse at bedside. Assessment completed with admission by this writer. IV fluids started per order. Enteric precautions initiated at this time. Pt educated how to call for help at this time and oriented to room and diet order.   UA order was a repeat as it resulted at 1517. Duplicate order d/c at this time.

## 2020-09-28 NOTE — ED Provider Notes (Signed)
[facility] EMERGENCY DEPARTMENT Provider Note   CSN: [id number] Arrival date & time: 09/28/20  6712     History Chief Complaint  Patient presents with  . Emesis    Alicia Tyler is a 50 y.o. female who presents to the ED today with complaint of gradual onset, constant, diffuse abdominal cramping for the past 4 days.  Patient also complains of nausea, nonbloody nonbilious emesis, watery diarrhea. She reports that her husband recently had similar symptoms admitted to the hospital this past weekend and found to have rotavirus.  She reports that she went and saw her PCP was prescribed Phenergan as well as Imodium, took the last night without relief.  She reports that she has ODT Zofran at home which was not helping.  He denies any fevers or chills.  She denies any recent antibiotic use.  Patient has no other complaints at this time.  Past surgical history of abdomen includes endometrial ablation, appendectomy, total colectomy, tubal ligation.   The history is provided by the patient and medical records.       Past Medical History:  Diagnosis Date  . Anxiety   . BMI (body mass index) 20.0-29.01 Apr 2010 172 LBS  . Chronic back pain   . Degenerative disc disease, thoracic   . Gastroparesis   . GERD (gastroesophageal reflux disease)   . Headache(784.0)   . IBS (irritable bowel syndrome)   . Incontinence of feces    WEARS DEPENDS AT HS    . Neuropathy   . PONV (postoperative nausea and vomiting)   . Pseudoobstruction of colon 08/24/2014  . Seasonal allergies   . Serrated adenoma of colon 05/2010    Patient Active Problem List   Diagnosis Date Noted  . AKI (acute kidney injury) ([place]) 09/28/2020  . Hypercalcemia 07/27/2019  . RUQ abdominal pain 01/21/2019  . Paresthesias/numbness 05/06/2018  . Bloating 12/17/2017  . Constipation 09/09/2017  . Lumbar pseudoarthrosis 09/03/2016  . Dysphagia 05/31/2015  . ARF (acute renal failure) ([place]) 09/03/2014  . UTI (urinary tract infection)  09/03/2014  . Gastroparesis 07/15/2014  . GERD (gastroesophageal reflux disease) 06/29/2014  . Serrated adenoma of colon 01/17/2011  . Irritable bowel syndrome 04/12/2010    Past Surgical History:  Procedure Laterality Date  . ABLATION     endometrial ablation  . APPENDECTOMY    . BACK SURGERY  05/14/2011   Lumbar Fusion and cage  . BIOPSY  06/22/2015   Procedure: BIOPSY;  Surgeon: Danie Binder, MD;  Location: [facility];  Service: Endoscopy;;  gastric and esophageal bx's  . BREAST LUMPECTOMY     from the left breast 2003,  2016  . COLECTOMY N/A 08/24/2014   Procedure: TOTAL COLECTOMY;  Surgeon: Jamesetta So, MD;  Location: [facility] ORS;  Service: General;  Laterality: N/A;  . COLON SURGERY    . COLONOSCOPY  NOV 2011 SCREENING    HYPERPLASTIC RECTAL POLYP, SML IH, incomplete due to discomfort  . COLONOSCOPY  DEC 2011 w/ PROPOFOL   SERRATED ADENOMA, HYPERPLASTIC POLY[S[  . ESOPHAGEAL DILATION    . ESOPHAGEAL MANOMETRY N/A 12/29/2017   Procedure: ESOPHAGEAL MANOMETRY (EM);  Surgeon: Mauri Pole, MD;  Location: [facility];  Service: Endoscopy;  Laterality: N/A;  . ESOPHAGOGASTRODUODENOSCOPY (EGD) WITH PROPOFOL N/A 06/22/2015   Procedure: ESOPHAGOGASTRODUODENOSCOPY (EGD) WITH PROPOFOL;  Surgeon: Danie Binder, MD;  [facility];  Service: Endoscopy;  Laterality: N/A;  . ESOPHAGOGASTRODUODENOSCOPY (EGD) WITH PROPOFOL N/A 01/21/2017   Procedure: ESOPHAGOGASTRODUODENOSCOPY (EGD) WITH  PROPOFOL;  Surgeon: Danie Binder, MD;  [facility];  Service: Endoscopy;  Laterality: N/A;  9:30am  . FLEXIBLE SIGMOIDOSCOPY N/A 06/22/2015   Procedure: FLEXIBLE SIGMOIDOSCOPY WITH PROPOFOL;  Surgeon: Danie Binder, MD;  [facility];  Service: Endoscopy;  Laterality: N/A;  0830   . HARDWARE REMOVAL N/A 09/03/2016   Procedure: Removal of Lumbar five-Sacrum one  hardware with Metrex;  Surgeon: Kristeen Miss, MD;  Location: [place];  Service: Neurosurgery;  Laterality:  N/A;  . KNEE SURGERY Bilateral    2 on righ-1 scope and one open; and one on left  . MOUTH SURGERY  07/21/2013  . OVARIAN CYST REMOVAL    . SAVORY DILATION N/A 01/21/2017   Procedure: SAVORY DILATION;  Surgeon: Danie Binder, MD;  [facility];  Service: Endoscopy;  Laterality: N/A;  . SMART PILL PROCEDURE N/A 07/04/2014   Procedure: SMART PILL PROCEDURE;  Surgeon: Danie Binder, MD;  [facility];  Service: Endoscopy;  Laterality: N/A;  800  . TUBAL LIGATION       OB History   No obstetric history on file.     Family History  Problem Relation Age of Onset  . Breast cancer Maternal Aunt   . Colon cancer Neg Hx   . Colon polyps Neg Hx     Social History   Tobacco Use  . Smoking status: Current Every Day Smoker    Packs/day: 1.00    Years: 36.00    Pack years: 36.00    Types: Cigarettes  . Smokeless tobacco: Never Used  . Tobacco comment: vapes CBD oil  Vaping Use  . Vaping Use: Never used  Substance Use Topics  . Alcohol use: Yes    Comment: once a year when they go to the beach  . Drug use: No    Home Medications Prior to Admission medications   Medication Sig Start Date End Date Taking? Authorizing Provider  Ascorbic Acid (VITAMIN C) 1000 MG tablet Take 1,000 mg by mouth in the morning and at bedtime.   Yes [provider]  Cholecalciferol (VITAMIN D3) 50 MCG (2000 UT) TABS Take 1 tablet by mouth daily.   Yes [provider]  Cyanocobalamin (B-12 COMPLIANCE INJECTION IJ) Inject as directed every 30 (thirty) days. Tapering   Yes [provider]  diphenoxylate-atropine (LOMOTIL) 2.5-0.025 MG tablet Take 1 tablet by mouth 4 (four) times daily as needed for diarrhea or loose stools. 09/27/20  Yes [provider]  escitalopram (LEXAPRO) 10 MG tablet Take 20 mg by mouth daily.   Yes [provider]  furosemide (LASIX) 20 MG tablet Take 20 mg by mouth 2 (two) times a week. Mondays and Fridays   Yes [provider]  gabapentin (NEURONTIN) 600 MG tablet Take 600 mg by mouth 3 (three) times daily.    Yes [provider]  LINZESS 290 MCG CAPS capsule TAKE ONE CAPSULE BY MOUTH EVERY DAY 30 minutes BEFORE BREAKFAST Patient taking differently: Take 290 mcg by mouth daily before breakfast. 44mins before breakfast 09/15/20  Yes Carlis Stable, NP  loratadine (CLARITIN) 10 MG tablet Take 10 mg by mouth daily.   Yes [provider]  meloxicam (MOBIC) 15 MG tablet Take 15 mg by mouth daily.   Yes [provider]  methocarbamol (ROBAXIN) 750 MG tablet Take 750 mg by mouth in the morning and at bedtime.   Yes [provider]  omeprazole (PRILOSEC) 20 MG  capsule TAKE ONE CAPSULE BY MOUTH ONE OR TWO TIMES DAILY 30 MINUTES BEFORE A MEAL Patient taking differently: Take 20-40 mg by mouth daily. 06/13/20  Yes Aliene Altes S, PA-C  ondansetron (ZOFRAN) 4 MG tablet Take 4 mg by mouth every 4 (four) hours as needed for nausea or vomiting.   Yes [provider]  oxyCODONE (OXY IR/ROXICODONE) 5 MG immediate release tablet Take 5 mg by mouth every 8 (eight) hours as needed for moderate pain.   Yes [provider]  promethazine (PHENERGAN) 25 MG tablet Take 25 mg by mouth every 8 (eight) hours as needed for nausea/vomiting. 09/27/20  Yes [provider]  rosuvastatin (CRESTOR) 5 MG tablet Take 5 mg by mouth daily.   Yes [provider]  SUMAtriptan (IMITREX) 100 MG tablet Take 100 mg by mouth every 2 (two) hours as needed for migraine or headache. May repeat in 2 hours if headache persists or recurs.   Yes [provider]  topiramate (TOPAMAX) 50 MG tablet Take 50 mg by mouth 2 (two) times daily.   Yes [provider]  Zinc Sulfate 220 (50 Zn) MG TABS Take 1 tablet by mouth in the morning.   Yes [provider]    Allergies    Aspirin, Bee venom, Eletriptan, Codeine, and Penicillins  Review of Systems   Review of  Systems  Constitutional: Negative for chills and fever.  Respiratory: Negative for shortness of breath.   Cardiovascular: Negative for chest pain.  Gastrointestinal: Positive for abdominal pain, diarrhea, nausea and vomiting.  Genitourinary: Negative for dysuria and frequency.  All other systems reviewed and are negative.   Physical Exam Updated Vital Signs BP (!) 139/101   Pulse (!) 103   Temp 98.1 F (36.7 C) (Oral)   Resp 14   Ht 5\' 3"  (1.6 m)   Wt 72.6 kg   LMP 07/22/2011 Comment: tubal ligation/ablation  SpO2 99%   BMI 28.34 kg/m   Physical Exam Vitals and nursing note reviewed.  Constitutional:      Appearance: She is not ill-appearing or diaphoretic.  HENT:     Head: Normocephalic and atraumatic.     Mouth/Throat:     Mouth: Mucous membranes are dry.  Eyes:     Conjunctiva/sclera: Conjunctivae normal.  Cardiovascular:     Rate and Rhythm: Normal rate and regular rhythm.     Pulses: Normal pulses.  Pulmonary:     Effort: Pulmonary effort is normal.     Breath sounds: Normal breath sounds. No wheezing, rhonchi or rales.  Abdominal:     Palpations: Abdomen is soft.     Tenderness: There is abdominal tenderness. There is no guarding or rebound.     Comments: Soft, diffuse abdominal TTP, +BS throughout, no r/g/r, neg murphy's, neg mcburney's, no CVA TTP  Musculoskeletal:     Cervical back: Neck supple.  Skin:    General: Skin is warm and dry.     Comments: Decreased skin turgor   Neurological:     Mental Status: She is alert.     ED Results / Procedures / Treatments   Labs (all labs ordered are listed, but only abnormal results are displayed) Labs Reviewed  COMPREHENSIVE METABOLIC PANEL - Abnormal; Notable for the following components:      Result Value   CO2 13 (*)    Glucose, Bld 119 (*)    BUN 60 (*)    Creatinine, Ser 3.49 (*)    Calcium 10.4 (*)  Total Protein 10.1 (*)    Albumin 5.8 (*)    GFR, Estimated 15 (*)    Anion gap 21 (*)    All  other components within normal limits  CBC WITH DIFFERENTIAL/PLATELET - Abnormal; Notable for the following components:   RBC 6.21 (*)    Hemoglobin 18.3 (*)    HCT 56.8 (*)    Monocytes Absolute 1.1 (*)    All other components within normal limits  BLOOD GAS, VENOUS - Abnormal; Notable for the following components:   pH, Ven 7.240 (*)    pCO2, Ven 43.1 (*)    pO2, Ven <31.0 (*)    Bicarbonate 15.9 (*)    Acid-base deficit 8.2 (*)    All other components within normal limits  SARS CORONAVIRUS 2 (TAT 6-24 HRS)  LIPASE, BLOOD  URINALYSIS, ROUTINE W REFLEX MICROSCOPIC    EKG None  Radiology No results found.  Procedures Procedures   Medications Ordered in ED Medications  sodium chloride 0.9 % bolus 1,000 mL (0 mLs Intravenous Stopped 09/28/20 1246)  ondansetron (ZOFRAN) injection 4 mg (4 mg Intravenous Given 09/28/20 1027)  dicyclomine (BENTYL) injection 10 mg (10 mg Intramuscular Given 09/28/20 1029)  sodium chloride 0.9 % bolus 1,000 mL (1,000 mLs Intravenous New Bag/Given 09/28/20 1243)  metoCLOPramide (REGLAN) injection 10 mg (10 mg Intravenous Given 09/28/20 1244)    ED Course  I have reviewed the triage vital signs and the nursing notes.  Pertinent labs & imaging results that were available during my care of the patient were reviewed by me and considered in my medical decision making (see chart for details).    MDM Rules/Calculators/A&P                          50 year old female who presents to the ED today with complaints of diffuse abdominal pain, nausea, vomiting, diarrhea for the past 4 days.  Husband was recently admitted and diagnosed with rotavirus.  She denies any other recent sick contacts or suspicious food intake.  Denies any recent antibiotic use.  On arrival to the ED patient is mildly tachycardic in the low 100s.  Remainder of vitals are unremarkable.  On exam she does have dry mucous membranes as well as decreased skin turgor.  She states that she has been  having some bilateral lower extremity cramping as well.  Suspect dehydration with possible potassium depletion.  Will obtain lab work at this time including CBC, CMP, lipase.  Will provide fluids, antiemetics, and bentyl for abdominal cramping with reevaluation. PSHx does include appendectomy as well as colectomy. Question diverticulitis causing symptoms however mostly suspicious for rotavirus/other viral gastroenteritis given recent sick contact with husband. May consider imaging if labs show abnormalities.   Lipase 26 CMP has returned with creatinine 3.49 (0.95 on 06/24/20) and BUN 60. Pt also appears to be in a metabolic acidosis with a bicarb of 13 and gap of 21. Glucose WNL at 119 without history of diabetes. Will add on VBG at this time. Pt will require admission.   Nursing staff attempted urine sample earlier however pt reported she was unable to void. She reports she has been voiding very little over the past several days however attributed it to dehydration. Will obtain bladder scan at this time; question post renal cause causing AKI?   Bladder scan 140  VBG with pH 7.240 consistent with metabolic acidosis. PO2 is < 31 however given VBG and not ABG with normal O2 sats  do not suspect this is accurate. PCO2 43.1; mildly compensated.   CBC delayed however has returned without leukocytosis. RBC, HGB, and HCT elevated suggested hemoconcentration/dehydration. Additional liter of fluids has been ordered. Will call for admission at this time for AKI and metabolic acidosis.  Discussed case with [facility] Dr.Tat who agrees to evaluate pt for admission.    This note was prepared using Dragon voice recognition software and may include unintentional dictation errors due to the inherent limitations of voice recognition software.  Final Clinical Impression(s) / ED Diagnoses Final diagnoses:  AKI (acute kidney injury) ([place])  Metabolic acidosis  Nausea vomiting and diarrhea  Gastroenteritis     Rx / DC Orders ED Discharge Orders    None       Eustaquio Maize, PA-C 09/28/20 1315    Milton Ferguson, MD 09/29/20 1329

## 2020-09-28 NOTE — ED Triage Notes (Signed)
Pt c/o abdominal cramping, vomiting, diarrhea x 3 days. Pt reports husband has same symptoms and was diagnosed with rotavirus recently.

## 2020-09-29 LAB — GASTROINTESTINAL PANEL BY PCR, STOOL (REPLACES STOOL CULTURE)

## 2020-09-29 LAB — C DIFFICILE QUICK SCREEN W PCR REFLEX
C Diff antigen: NEGATIVE
C Diff interpretation: NOT DETECTED
C Diff toxin: NEGATIVE

## 2020-09-29 LAB — CBC
HCT: 42.8 % (ref 36.0–46.0)
Hemoglobin: 13.8 g/dL (ref 12.0–15.0)
MCH: 29.2 pg (ref 26.0–34.0)
MCHC: 32.2 g/dL (ref 30.0–36.0)
MCV: 90.7 fL (ref 80.0–100.0)
Platelets: 221 10*3/uL (ref 150–400)
RBC: 4.72 MIL/uL (ref 3.87–5.11)
RDW: 13.9 % (ref 11.5–15.5)
WBC: 7.2 10*3/uL (ref 4.0–10.5)
nRBC: 0 % (ref 0.0–0.2)

## 2020-09-29 LAB — BASIC METABOLIC PANEL
Anion gap: 10 (ref 5–15)
BUN: 30 mg/dL — ABNORMAL HIGH (ref 6–20)
CO2: 20 mmol/L — ABNORMAL LOW (ref 22–32)
Calcium: 8.7 mg/dL — ABNORMAL LOW (ref 8.9–10.3)
Chloride: 109 mmol/L (ref 98–111)
Creatinine, Ser: 1.05 mg/dL — ABNORMAL HIGH (ref 0.44–1.00)
GFR, Estimated: >60 mL/min (ref >60)
Glucose, Bld: 84 mg/dL (ref 70–99)
Potassium: 3.1 mmol/L — ABNORMAL LOW (ref 3.5–5.1)
Sodium: 139 mmol/L (ref 135–145)

## 2020-09-29 MED ORDER — POTASSIUM CHLORIDE CRYS ER 20 MEQ PO TBCR
40.0000 meq | EXTENDED_RELEASE_TABLET | Freq: Two times a day (BID) | ORAL | Status: DC
  Administered 2020-09-29 – 2020-09-30 (×3): 40 meq via ORAL
  Filled 2020-09-29 (×3): qty 2

## 2020-09-29 NOTE — Progress Notes (Addendum)
PROGRESS NOTE    Alicia Tyler  ZOX:[id number] DOB: [birth year] DOA: 09/28/2020 PCP: Monico Blitz, MD   Brief Narrative:   Alicia Tyler is a 50 y.o. female with medical history of small bowel perforation status post subtotal colectomy, C. difficile colitis, chronic back pain, depression, hyperlipidemia, and migraine headaches presenting with 3-day history of nausea, vomiting, and diarrhea that began on 09/25/2020.  Her husband was recently diagnosed with rotavirus and now she is noted to have the same.  She was also noted to have AKI on admission which has now resolved with IV fluid administration.  Assessment & Plan:   Active Problems:   Hypercalcemia   AKI (acute kidney injury) ([place])   Intractable vomiting   Acute gastroenteritis secondary to rotavirus -Improving significantly and now tolerating diet -Discontinue further IV fluid -Continue to monitor overnight and if stable could be discharged in a.m.  AKI-prerenal secondary to above -Creatinine 0.8-1.0 -AKI has resolved with IV fluid administration -Hold meloxicam and furosemide for now  Hypokalemia -Likely related to diarrhea -Replete and reassess in a.m.  Depression/anxiety -Lexapro  Dyslipidemia -Crestor  Chronic back pain -Continue current medications  DVT prophylaxis: Heparin Code Status: Full Family Communication: Patient will call husband Disposition Plan:  Status is: Inpatient  Remains inpatient appropriate because:IV treatments appropriate due to intensity of illness or inability to take PO and Inpatient level of care appropriate due to severity of illness   Dispo: The patient is from: Home              Anticipated d/c is to: Home              Patient currently is not medically stable to d/c.   Difficult to place patient No   Consultants:   None  Procedures:   See below  Antimicrobials:   None   Subjective: Patient seen and evaluated today with no new acute complaints or concerns. No  acute concerns or events noted overnight.  She states that her nausea and vomiting are improving as well as her diarrhea.  She still feels "sick to my stomach."  Objective: Vitals:   09/28/20 1430 09/28/20 1515 09/28/20 2229 09/29/20 0554  BP: 134/88 123/81 (!) 101/55 99/60  Pulse: 75 80 81 73  Resp: 18 18 16 18   Temp:  97.7 F (36.5 C) 98.9 F (37.2 C) 99 F (37.2 C)  TempSrc:  Oral Oral Oral  SpO2: 98% 100% 97% 98%  Weight:      Height:        Intake/Output Summary (Last 24 hours) at 09/29/2020 1409 Last data filed at 09/29/2020 0800 Gross per 24 hour  Intake 2628.15 ml  Output --  Net 2628.15 ml   Filed Weights   09/28/20 0931  Weight: 72.6 kg    Examination:  General exam: Appears calm and comfortable  Respiratory system: Clear to auscultation. Respiratory effort normal. Cardiovascular system: S1 & S2 heard, RRR.  Gastrointestinal system: Abdomen is soft Central nervous system: Alert and awake Extremities: No edema Skin: No significant lesions noted Psychiatry: Flat affect.    Data Reviewed: I have personally reviewed following labs and imaging studies  CBC: Recent Labs  Lab 09/28/20 1044 09/29/20 0515  WBC 9.1 7.2  NEUTROABS 6.8  --   HGB 18.3* 13.8  HCT 56.8* 42.8  MCV 91.5 90.7  PLT 269 811   Basic Metabolic Panel: Recent Labs  Lab 09/28/20 1044 09/29/20 0515  NA 135 139  K 4.2 3.1*  CL 101 109  CO2 13* 20*  GLUCOSE 119* 84  BUN 60* 30*  CREATININE 3.49* 1.05*  CALCIUM 10.4* 8.7*   GFR: Estimated Creatinine Clearance: 61.2 mL/min (A) (by C-G formula based on SCr of 1.05 mg/dL (H)). Liver Function Tests: Recent Labs  Lab 09/28/20 1044  AST 30  ALT 27  ALKPHOS 77  BILITOT 0.6  PROT 10.1*  ALBUMIN 5.8*   Recent Labs  Lab 09/28/20 1044  LIPASE 26   No results for input(s): AMMONIA in the last 168 hours. Coagulation Profile: No results for input(s): INR, PROTIME in the last 168 hours. Cardiac Enzymes: No results for input(s):  CKTOTAL, CKMB, CKMBINDEX, TROPONINI in the last 168 hours. BNP (last 3 results) No results for input(s): PROBNP in the last 8760 hours. HbA1C: No results for input(s): HGBA1C in the last 72 hours. CBG: No results for input(s): GLUCAP in the last 168 hours. Lipid Profile: No results for input(s): CHOL, HDL, LDLCALC, TRIG, CHOLHDL, LDLDIRECT in the last 72 hours. Thyroid Function Tests: No results for input(s): TSH, T4TOTAL, FREET4, T3FREE, THYROIDAB in the last 72 hours. Anemia Panel: No results for input(s): VITAMINB12, FOLATE, FERRITIN, TIBC, IRON, RETICCTPCT in the last 72 hours. Sepsis Labs: No results for input(s): PROCALCITON, LATICACIDVEN in the last 168 hours.  Recent Results (from the past 240 hour(s))  SARS CORONAVIRUS 2 (TAT 6-24 HRS) Nasopharyngeal Nasopharyngeal Swab     Status: None   Collection Time: 09/28/20 12:52 PM   Specimen: Nasopharyngeal Swab  Result Value Ref Range Status   SARS Coronavirus 2 NEGATIVE NEGATIVE Final    Comment: (NOTE) SARS-CoV-2 target nucleic acids are NOT DETECTED.  The SARS-CoV-2 RNA is generally detectable in upper and lower respiratory specimens during the acute phase of infection. Negative results do not preclude SARS-CoV-2 infection, do not rule out co-infections with other pathogens, and should not be used as the sole basis for treatment or other patient management decisions. Negative results must be combined with clinical observations, patient history, and epidemiological information. The expected result is Negative.  Fact Sheet for Patients: SugarRoll.be  Fact Sheet for Healthcare Providers: https://www.woods-mathews.com/  This test is not yet approved or cleared by the [place] FDA and  has been authorized for detection and/or diagnosis of SARS-CoV-2 by FDA under an Emergency Use Authorization (EUA). This EUA will remain  in effect (meaning this test can be used) for the duration  of the COVID-19 declaration under Se ction 564(b)(1) of the Act, 21 U.S.C. section 360bbb-3(b)(1), unless the authorization is terminated or revoked sooner.  Performed at [facility], [place] [address] [place],  [address]   Gastrointestinal Panel by PCR , Stool     Status: Abnormal   Collection Time: 09/29/20 12:10 AM   Specimen: Stool  Result Value Ref Range Status   Campylobacter species NOT DETECTED NOT DETECTED Final   Plesimonas shigelloides NOT DETECTED NOT DETECTED Final   Salmonella species NOT DETECTED NOT DETECTED Final   Yersinia enterocolitica NOT DETECTED NOT DETECTED Final   Vibrio species NOT DETECTED NOT DETECTED Final   Vibrio cholerae NOT DETECTED NOT DETECTED Final   Enteroaggregative E coli (EAEC) NOT DETECTED NOT DETECTED Final   Enteropathogenic E coli (EPEC) NOT DETECTED NOT DETECTED Final   Enterotoxigenic E coli (ETEC) NOT DETECTED NOT DETECTED Final   Shiga like toxin producing E coli (STEC) NOT DETECTED NOT DETECTED Final   Shigella/Enteroinvasive E coli (EIEC) NOT DETECTED NOT DETECTED Final   Cryptosporidium NOT DETECTED NOT DETECTED Final  Cyclospora cayetanensis NOT DETECTED NOT DETECTED Final   Entamoeba histolytica NOT DETECTED NOT DETECTED Final   Giardia lamblia NOT DETECTED NOT DETECTED Final   Adenovirus F40/41 NOT DETECTED NOT DETECTED Final   Astrovirus NOT DETECTED NOT DETECTED Final   Norovirus GI/GII NOT DETECTED NOT DETECTED Final   Rotavirus A DETECTED (A) NOT DETECTED Final   Sapovirus (I, II, IV, and V) NOT DETECTED NOT DETECTED Final    Comment: Performed at [facility], [address] [place], [place] [address]  C Difficile Quick Screen w PCR reflex     Status: None   Collection Time: 09/29/20 12:10 AM   Specimen: STOOL  Result Value Ref Range Status   C Diff antigen NEGATIVE NEGATIVE Final   C Diff toxin NEGATIVE NEGATIVE Final   C Diff interpretation No C. difficile detected.  Final    Comment:  Performed at [facility], [address], [place], [place] [address]         Radiology Studies: No results found.      Scheduled Meds: . escitalopram  20 mg Oral Daily  . gabapentin  600 mg Oral TID  . heparin  5,000 Units Subcutaneous Q8H  . loratadine  10 mg Oral Daily  . methocarbamol  750 mg Oral QHS  . ondansetron (ZOFRAN) IV  4 mg Intravenous Q6H  . pantoprazole (PROTONIX) IV  40 mg Intravenous Q12H  . potassium chloride  40 mEq Oral BID  . rosuvastatin  5 mg Oral Daily  . topiramate  50 mg Oral BID    LOS: 1 day    Time spent: 35 minutes    Daylen Hack Darleen Crocker, DO [facility]  If 7PM-7AM, please contact night-coverage [email or web address] 09/29/2020, 2:09 PM

## 2020-09-30 LAB — CBC
HCT: 39.5 % (ref 36.0–46.0)
Hemoglobin: 12.9 g/dL (ref 12.0–15.0)
MCH: 29.6 pg (ref 26.0–34.0)
MCHC: 32.7 g/dL (ref 30.0–36.0)
MCV: 90.6 fL (ref 80.0–100.0)
Platelets: 200 10*3/uL (ref 150–400)
RBC: 4.36 MIL/uL (ref 3.87–5.11)
RDW: 13.9 % (ref 11.5–15.5)
WBC: 5.3 10*3/uL (ref 4.0–10.5)
nRBC: 0 % (ref 0.0–0.2)

## 2020-09-30 LAB — BASIC METABOLIC PANEL
Anion gap: 7 (ref 5–15)
BUN: 15 mg/dL (ref 6–20)
CO2: 24 mmol/L (ref 22–32)
Calcium: 8.8 mg/dL — ABNORMAL LOW (ref 8.9–10.3)
Chloride: 112 mmol/L — ABNORMAL HIGH (ref 98–111)
Creatinine, Ser: 0.84 mg/dL (ref 0.44–1.00)
GFR, Estimated: >60 mL/min (ref >60)
Glucose, Bld: 98 mg/dL (ref 70–99)
Potassium: 4.3 mmol/L (ref 3.5–5.1)
Sodium: 143 mmol/L (ref 135–145)

## 2020-09-30 LAB — URINE CULTURE: Culture: <10000 — AB

## 2020-09-30 LAB — MAGNESIUM: Magnesium: 2.1 mg/dL (ref 1.7–2.4)

## 2020-09-30 MED ORDER — PANTOPRAZOLE SODIUM 40 MG IV SOLR: 40.0000 mg | Freq: Two times a day (BID) | INTRAVENOUS | 0 refills | Status: DC

## 2020-09-30 MED ORDER — ONDANSETRON HCL 4 MG PO TABS: 4.0000 mg | ORAL_TABLET | Freq: Four times a day (QID) | ORAL | 0 refills | Status: DC | PRN

## 2020-09-30 MED ORDER — LOPERAMIDE HCL 2 MG PO TABS: 2.0000 mg | ORAL_TABLET | Freq: Four times a day (QID) | ORAL | 0 refills | Status: DC | PRN

## 2020-09-30 NOTE — Discharge Summary (Signed)
Physician Discharge Summary  Alicia Tyler OMV:[id number] DOB: Aug 08, 1970 DOA: 09/28/2020  PCP: Monico Blitz, MD  Admit date: 09/28/2020  Discharge date: 09/30/2020  Admitted From:Home  Disposition:  Home  Recommendations for Outpatient Follow-up:  1. Follow up with PCP in 1-2 weeks 2. Continue on Imodium and Zofran as needed for symptomatic relief 3. Continue on other home medications as prior  Home Health: None  Equipment/Devices: None  Discharge Condition:Stable  CODE STATUS: Full  Diet recommendation: Heart Healthy  Brief/Interim Summary: Alicia Chatmanis a 50 y.o.femalewith medical history ofsmall bowel perforation status post subtotal colectomy, C. difficile colitis, chronic back pain, depression, hyperlipidemia, and migraine headaches presenting with 3-day history of nausea, vomiting, and diarrhea that began on 09/25/2020.  Her husband was recently diagnosed with rotavirus and now she is noted to have the same.  She was also noted to have AKI on admission which has now resolved with IV fluid administration.  She is tolerating her diet well and has no further nausea or vomiting or diarrhea.  She is in stable condition for discharge with symptomatic medications prescribed as needed.  Discharge Diagnoses:  Active Problems:   Hypercalcemia   AKI (acute kidney injury) ([place])   Intractable vomiting  Principal discharge diagnosis: Acute gastroenteritis secondary to rotavirus with associated dehydration and AKI.  Discharge Instructions  Discharge Instructions    Diet - low sodium heart healthy   Complete by: As directed    Increase activity slowly   Complete by: As directed      Allergies as of 09/30/2020      Reactions   Aspirin Shortness Of Breath, Swelling, Rash   Bee Venom Anaphylaxis       Eletriptan Anaphylaxis, Swelling   Codeine Nausea And Vomiting, Rash   Penicillins Other (See Comments)   Makes hair fall out  Has patient had a PCN reaction causing immediate  rash, facial/tongue/throat swelling, SOB or lightheadedness with hypotension: unknown Has patient had a PCN reaction causing severe rash involving mucus membranes or skin necrosis: unknown Has patient had a PCN reaction that required hospitalization unknown Has patient had a PCN reaction occurring within the last 10 years: No If all of the above answers are "NO", then may proceed with Cephalosporin use.      Medication List    TAKE these medications   B-12 COMPLIANCE INJECTION IJ Inject as directed every 30 (thirty) days. Tapering   diphenoxylate-atropine 2.5-0.025 MG tablet Commonly known as: LOMOTIL Take 1 tablet by mouth 4 (four) times daily as needed for diarrhea or loose stools.   escitalopram 10 MG tablet Commonly known as: LEXAPRO Take 20 mg by mouth daily.   furosemide 20 MG tablet Commonly known as: LASIX Take 20 mg by mouth 2 (two) times a week. Mondays and Fridays   gabapentin 600 MG tablet Commonly known as: NEURONTIN Take 600 mg by mouth 3 (three) times daily.   Linzess 290 MCG Caps capsule Generic drug: linaclotide TAKE ONE CAPSULE BY MOUTH EVERY DAY 30 minutes BEFORE BREAKFAST What changed: See the new instructions.   loperamide 2 MG tablet Commonly known as: Imodium A-D Take 1 tablet (2 mg total) by mouth 4 (four) times daily as needed for diarrhea or loose stools.   loratadine 10 MG tablet Commonly known as: CLARITIN Take 10 mg by mouth daily.   meloxicam 15 MG tablet Commonly known as: MOBIC Take 15 mg by mouth daily.   methocarbamol 750 MG tablet Commonly known as: ROBAXIN Take 750 mg by mouth in  the morning and at bedtime.   omeprazole 20 MG capsule Commonly known as: PRILOSEC TAKE ONE CAPSULE BY MOUTH ONE OR TWO TIMES DAILY 30 MINUTES BEFORE A MEAL What changed: See the new instructions.   ondansetron 4 MG tablet Commonly known as: ZOFRAN Take 4 mg by mouth every 4 (four) hours as needed for nausea or vomiting. What changed: Another  medication with the same name was added. Make sure you understand how and when to take each.   ondansetron 4 MG tablet Commonly known as: ZOFRAN Take 1 tablet (4 mg total) by mouth every 6 (six) hours as needed for nausea. What changed: You were already taking a medication with the same name, and this prescription was added. Make sure you understand how and when to take each.   oxyCODONE 5 MG immediate release tablet Commonly known as: Oxy IR/ROXICODONE Take 5 mg by mouth every 8 (eight) hours as needed for moderate pain.   pantoprazole 40 MG injection Commonly known as: PROTONIX Inject 40 mg into the vein every 12 (twelve) hours for 10 days.   promethazine 25 MG tablet Commonly known as: PHENERGAN Take 25 mg by mouth every 8 (eight) hours as needed for nausea/vomiting.   rosuvastatin 5 MG tablet Commonly known as: CRESTOR Take 5 mg by mouth daily.   SUMAtriptan 100 MG tablet Commonly known as: IMITREX Take 100 mg by mouth every 2 (two) hours as needed for migraine or headache. May repeat in 2 hours if headache persists or recurs.   topiramate 50 MG tablet Commonly known as: TOPAMAX Take 50 mg by mouth 2 (two) times daily.   vitamin C 1000 MG tablet Take 1,000 mg by mouth in the morning and at bedtime.   Vitamin D3 50 MCG (2000 UT) Tabs Take 1 tablet by mouth daily.   Zinc Sulfate 220 (50 Zn) MG Tabs Take 1 tablet by mouth in the morning.       Follow-up Information    Monico Blitz, MD. Schedule an appointment as soon as possible for a visit in 1 week(s).   Specialty: Internal Medicine Contact information: [address]  [place] [address] [phone]              Allergies  Allergen Reactions  . Aspirin Shortness Of Breath, Swelling and Rash  . Bee Venom Anaphylaxis       . Eletriptan Anaphylaxis and Swelling  . Codeine Nausea And Vomiting and Rash  . Penicillins Other (See Comments)    Makes hair fall out   Has patient had a PCN reaction causing  immediate rash, facial/tongue/throat swelling, SOB or lightheadedness with hypotension: unknown Has patient had a PCN reaction causing severe rash involving mucus membranes or skin necrosis: unknown Has patient had a PCN reaction that required hospitalization unknown Has patient had a PCN reaction occurring within the last 10 years: No If all of the above answers are "NO", then may proceed with Cephalosporin use.      Consultations:  None   Procedures/Studies:  No results found.   Discharge Exam: Vitals:   09/29/20 2117 09/30/20 0639  BP: 99/63 109/81  Pulse: 72 74  Resp: 18 16  Temp: 98.4 F (36.9 C) 98.8 F (37.1 C)  SpO2: 98% 100%   Vitals:   09/28/20 2229 09/29/20 0554 09/29/20 2117 09/30/20 0639  BP: (!) 101/55 99/60 99/63  109/81  Pulse: 81 73 72 74  Resp: 16 18 18 16   Temp: 98.9 F (37.2 C) 99 F (37.2 C)  98.4 F (36.9 C) 98.8 F (37.1 C)  TempSrc: Oral Oral Oral Oral  SpO2: 97% 98% 98% 100%  Weight:      Height:        General: Pt is alert, awake, not in acute distress Cardiovascular: RRR, S1/S2 +, no rubs, no gallops Respiratory: CTA bilaterally, no wheezing, no rhonchi Abdominal: Soft, NT, ND, bowel sounds + Extremities: no edema, no cyanosis    The results of significant diagnostics from this hospitalization (including imaging, microbiology, ancillary and laboratory) are listed below for reference.     Microbiology: Recent Results (from the past 240 hour(s))  SARS CORONAVIRUS 2 (TAT 6-24 HRS) Nasopharyngeal Nasopharyngeal Swab     Status: None   Collection Time: 09/28/20 12:52 PM   Specimen: Nasopharyngeal Swab  Result Value Ref Range Status   SARS Coronavirus 2 NEGATIVE NEGATIVE Final    Comment: (NOTE) SARS-CoV-2 target nucleic acids are NOT DETECTED.  The SARS-CoV-2 RNA is generally detectable in upper and lower respiratory specimens during the acute phase of infection. Negative results do not preclude SARS-CoV-2 infection, do not rule  out co-infections with other pathogens, and should not be used as the sole basis for treatment or other patient management decisions. Negative results must be combined with clinical observations, patient history, and epidemiological information. The expected result is Negative.  Fact Sheet for Patients: SugarRoll.be  Fact Sheet for Healthcare Providers: https://www.woods-mathews.com/  This test is not yet approved or cleared by the [place] FDA and  has been authorized for detection and/or diagnosis of SARS-CoV-2 by FDA under an Emergency Use Authorization (EUA). This EUA will remain  in effect (meaning this test can be used) for the duration of the COVID-19 declaration under Se ction 564(b)(1) of the Act, 21 U.S.C. section 360bbb-3(b)(1), unless the authorization is terminated or revoked sooner.  Performed at [facility], [place] [address], [place], [place] [address]   Culture, Urine     Status: Abnormal   Collection Time: 09/28/20  3:21 PM   Specimen: Urine, Clean Catch  Result Value Ref Range Status   Specimen Description   Final    URINE, CLEAN CATCH Performed at [facility], [address], [place], [place] [address]    Special Requests   Final    NONE Performed at [facility], [address], [place], [place] [address]    Culture (A)  Final    <10,000 COLONIES/mL INSIGNIFICANT GROWTH Performed at [facility] [address] [place], [place] [address]    Report Status 09/30/2020 FINAL  Final  Gastrointestinal Panel by PCR , Stool     Status: Abnormal   Collection Time: 09/29/20 12:10 AM   Specimen: Stool  Result Value Ref Range Status   Campylobacter species NOT DETECTED NOT DETECTED Final   Plesimonas shigelloides NOT DETECTED NOT DETECTED Final   Salmonella species NOT DETECTED NOT DETECTED Final   Yersinia enterocolitica NOT DETECTED NOT DETECTED Final   Vibrio species NOT DETECTED NOT DETECTED Final    Vibrio cholerae NOT DETECTED NOT DETECTED Final   Enteroaggregative E coli (EAEC) NOT DETECTED NOT DETECTED Final   Enteropathogenic E coli (EPEC) NOT DETECTED NOT DETECTED Final   Enterotoxigenic E coli (ETEC) NOT DETECTED NOT DETECTED Final   Shiga like toxin producing E coli (STEC) NOT DETECTED NOT DETECTED Final   Shigella/Enteroinvasive E coli (EIEC) NOT DETECTED NOT DETECTED Final   Cryptosporidium NOT DETECTED NOT DETECTED Final   Cyclospora cayetanensis NOT DETECTED NOT DETECTED Final  Entamoeba histolytica NOT DETECTED NOT DETECTED Final   Giardia lamblia NOT DETECTED NOT DETECTED Final   Adenovirus F40/41 NOT DETECTED NOT DETECTED Final   Astrovirus NOT DETECTED NOT DETECTED Final   Norovirus GI/GII NOT DETECTED NOT DETECTED Final   Rotavirus A DETECTED (A) NOT DETECTED Final   Sapovirus (I, II, IV, and V) NOT DETECTED NOT DETECTED Final    Comment: Performed at [facility], [address] [place], [place] [address]  C Difficile Quick Screen w PCR reflex     Status: None   Collection Time: 09/29/20 12:10 AM   Specimen: STOOL  Result Value Ref Range Status   C Diff antigen NEGATIVE NEGATIVE Final   C Diff toxin NEGATIVE NEGATIVE Final   C Diff interpretation No C. difficile detected.  Final    Comment: Performed at [facility], [address] [place], [place] [address]     Labs: BNP (last 3 results) No results for input(s): BNP in the last 8760 hours. Basic Metabolic Panel: Recent Labs  Lab 09/28/20 1044 09/29/20 0515 09/30/20 0610  NA 135 139 143  K 4.2 3.1* 4.3  CL 101 109 112*  CO2 13* 20* 24  GLUCOSE 119* 84 98  BUN 60* 30* 15  CREATININE 3.49* 1.05* 0.84  CALCIUM 10.4* 8.7* 8.8*  MG  --   --  2.1   Liver Function Tests: Recent Labs  Lab 09/28/20 1044  AST 30  ALT 27  ALKPHOS 77  BILITOT 0.6  PROT 10.1*  ALBUMIN 5.8*   Recent Labs  Lab 09/28/20 1044  LIPASE 26   No results for input(s): AMMONIA in the last 168  hours. CBC: Recent Labs  Lab 09/28/20 1044 09/29/20 0515 09/30/20 0610  WBC 9.1 7.2 5.3  NEUTROABS 6.8  --   --   HGB 18.3* 13.8 12.9  HCT 56.8* 42.8 39.5  MCV 91.5 90.7 90.6  PLT 269 221 200   Cardiac Enzymes: No results for input(s): CKTOTAL, CKMB, CKMBINDEX, TROPONINI in the last 168 hours. BNP: Invalid input(s): POCBNP CBG: No results for input(s): GLUCAP in the last 168 hours. D-Dimer No results for input(s): DDIMER in the last 72 hours. Hgb A1c No results for input(s): HGBA1C in the last 72 hours. Lipid Profile No results for input(s): CHOL, HDL, LDLCALC, TRIG, CHOLHDL, LDLDIRECT in the last 72 hours. Thyroid function studies No results for input(s): TSH, T4TOTAL, T3FREE, THYROIDAB in the last 72 hours.  Invalid input(s): FREET3 Anemia work up No results for input(s): VITAMINB12, FOLATE, FERRITIN, TIBC, IRON, RETICCTPCT in the last 72 hours. Urinalysis    Component Value Date/Time   COLORURINE YELLOW 09/28/2020 1517   APPEARANCEUR HAZY (A) 09/28/2020 1517   LABSPEC 1.015 09/28/2020 1517   PHURINE 5.0 09/28/2020 1517   GLUCOSEU NEGATIVE 09/28/2020 1517   HGBUR NEGATIVE 09/28/2020 1517   BILIRUBINUR NEGATIVE 09/28/2020 1517   KETONESUR 5 (A) 09/28/2020 1517   PROTEINUR 30 (A) 09/28/2020 1517   UROBILINOGEN 1.0 09/03/2014 0058   NITRITE NEGATIVE 09/28/2020 1517   LEUKOCYTESUR NEGATIVE 09/28/2020 1517   Sepsis Labs Invalid input(s): PROCALCITONIN,  WBC,  LACTICIDVEN Microbiology Recent Results (from the past 240 hour(s))  SARS CORONAVIRUS 2 (TAT 6-24 HRS) Nasopharyngeal Nasopharyngeal Swab     Status: None   Collection Time: 09/28/20 12:52 PM   Specimen: Nasopharyngeal Swab  Result Value Ref Range Status   SARS Coronavirus 2 NEGATIVE NEGATIVE Final    Comment: (NOTE) SARS-CoV-2 target nucleic acids are NOT DETECTED.  The SARS-CoV-2  RNA is generally detectable in upper and lower respiratory specimens during the acute phase of infection. Negative results  do not preclude SARS-CoV-2 infection, do not rule out co-infections with other pathogens, and should not be used as the sole basis for treatment or other patient management decisions. Negative results must be combined with clinical observations, patient history, and epidemiological information. The expected result is Negative.  Fact Sheet for Patients: SugarRoll.be  Fact Sheet for Healthcare Providers: https://www.woods-mathews.com/  This test is not yet approved or cleared by the [place] FDA and  has been authorized for detection and/or diagnosis of SARS-CoV-2 by FDA under an Emergency Use Authorization (EUA). This EUA will remain  in effect (meaning this test can be used) for the duration of the COVID-19 declaration under Se ction 564(b)(1) of the Act, 21 U.S.C. section 360bbb-3(b)(1), unless the authorization is terminated or revoked sooner.  Performed at [facility], [place] [address], [place], [place] [address]   Culture, Urine     Status: Abnormal   Collection Time: 09/28/20  3:21 PM   Specimen: Urine, Clean Catch  Result Value Ref Range Status   Specimen Description   Final    URINE, CLEAN CATCH Performed at [facility], [address], [place], [place] [address]    Special Requests   Final    NONE Performed at [facility], [address] [place], [place] [address]    Culture (A)  Final    <10,000 COLONIES/mL INSIGNIFICANT GROWTH Performed at [facility] [address] [place], [place] [address]    Report Status 09/30/2020 FINAL  Final  Gastrointestinal Panel by PCR , Stool     Status: Abnormal   Collection Time: 09/29/20 12:10 AM   Specimen: Stool  Result Value Ref Range Status   Campylobacter species NOT DETECTED NOT DETECTED Final   Plesimonas shigelloides NOT DETECTED NOT DETECTED Final   Salmonella species NOT DETECTED NOT DETECTED Final   Yersinia enterocolitica NOT DETECTED NOT DETECTED Final    Vibrio species NOT DETECTED NOT DETECTED Final   Vibrio cholerae NOT DETECTED NOT DETECTED Final   Enteroaggregative E coli (EAEC) NOT DETECTED NOT DETECTED Final   Enteropathogenic E coli (EPEC) NOT DETECTED NOT DETECTED Final   Enterotoxigenic E coli (ETEC) NOT DETECTED NOT DETECTED Final   Shiga like toxin producing E coli (STEC) NOT DETECTED NOT DETECTED Final   Shigella/Enteroinvasive E coli (EIEC) NOT DETECTED NOT DETECTED Final   Cryptosporidium NOT DETECTED NOT DETECTED Final   Cyclospora cayetanensis NOT DETECTED NOT DETECTED Final   Entamoeba histolytica NOT DETECTED NOT DETECTED Final   Giardia lamblia NOT DETECTED NOT DETECTED Final   Adenovirus F40/41 NOT DETECTED NOT DETECTED Final   Astrovirus NOT DETECTED NOT DETECTED Final   Norovirus GI/GII NOT DETECTED NOT DETECTED Final   Rotavirus A DETECTED (A) NOT DETECTED Final   Sapovirus (I, II, IV, and V) NOT DETECTED NOT DETECTED Final    Comment: Performed at [facility], [address] [place], [place] [address]  C Difficile Quick Screen w PCR reflex     Status: None   Collection Time: 09/29/20 12:10 AM   Specimen: STOOL  Result Value Ref Range Status   C Diff antigen NEGATIVE NEGATIVE Final   C Diff toxin NEGATIVE NEGATIVE Final   C Diff interpretation No C. difficile detected.  Final    Comment: Performed at [facility], [address], [place],  [address]     Time coordinating discharge:  35 minutes  SIGNED:   Rodena Goldmann, DO [facility] 09/30/2020, 10:09 AM  If 7PM-7AM, please contact night-coverage [email or web address]

## 2020-09-30 NOTE — Progress Notes (Signed)
Discharge instructions given to patient. Pt verbalized understanding.

## 2020-10-19 ENCOUNTER — Ambulatory Visit (INDEPENDENT_AMBULATORY_CARE_PROVIDER_SITE_OTHER): Payer: Medicaid Other | Admitting: Nurse Practitioner

## 2020-10-19 ENCOUNTER — Encounter: Payer: Self-pay | Admitting: Nurse Practitioner

## 2020-10-19 ENCOUNTER — Other Ambulatory Visit: Payer: Self-pay

## 2020-10-19 VITALS — BP 117/77 | HR 84 | Temp 97.5°F | Ht 63.0 in | Wt 164.2 lb

## 2020-10-19 DIAGNOSIS — R110 Nausea: Secondary | ICD-10-CM | POA: Insufficient documentation

## 2020-10-19 DIAGNOSIS — K5900 Constipation, unspecified: Secondary | ICD-10-CM

## 2020-10-19 DIAGNOSIS — K3184 Gastroparesis: Secondary | ICD-10-CM | POA: Diagnosis not present

## 2020-10-19 DIAGNOSIS — K219 Gastro-esophageal reflux disease without esophagitis: Secondary | ICD-10-CM | POA: Diagnosis not present

## 2020-10-19 DIAGNOSIS — D126 Benign neoplasm of colon, unspecified: Secondary | ICD-10-CM

## 2020-10-19 NOTE — Progress Notes (Signed)
Cc'ed to pcp °

## 2020-10-19 NOTE — Patient Instructions (Signed)
Your health issues we discussed today were:   GERD (reflux/heartburn): 1. Glad you are doing well! 2. Continue taking your omeprazole twice daily 3. Call us for any worsening or severe symptoms  Constipation: 1. Glad you are doing well on Linzess 2. Continue taking Linzess 290 mcg daily 3. Call us for any worsening or severe symptoms  Gastroparesis (flunking stomach) with persistent nausea: 1. I am glad Zofran is effective for you 2. Continue taking Zofran as needed 3. If Zofran ever stops working we can discuss other medication options 4. Call us for any worsening or severe symptoms  Need for flexible sigmoidoscopy: 1. We will schedule your flex sig for you 2. Further recommendations will follow your procedure  Overall I recommend:  1. Continue other current medications 2. Return for follow-up 1 year 3. Call us for any questions or concerns   At [facility] we value your feedback. You may receive a survey about your visit today. Please share your experience as we strive to create trusting relationships with our patients to provide genuine, compassionate, quality care.  We appreciate your understanding and patience as we review any laboratory studies, imaging, and other diagnostic tests that are ordered as we care for you. Our office policy is 5 business days for review of these results, and any emergent or urgent results are addressed in a timely manner for your best interest. If you do not hear from our office in 1 week, please contact us.   We also encourage the use of MyChart, which contains your medical information for your review as well. If you are not enrolled in this feature, an access code is on this after visit summary for your convenience. Thank you for allowing [place] to be involved in your care.  It was great to see you today!  I hope you have a great summer!!

## 2020-10-19 NOTE — Progress Notes (Signed)
Referring Provider: Monico Blitz, MD Primary Care Physician:  Monico Blitz, MD Primary GI:  Dr. Abbey Chatters  Chief Complaint  Patient presents with  . Gastroesophageal Reflux  . Constipation    Occas straining to get BM started then will have normal BM  . Nausea    All the time. Takes zofran sometimes up to twice a day    HPI:   Alicia Tyler is a 51 y.o. female who presents for follow-up on GERD and IBS-C.  Patient was last seen in our office 10/20/2019 for the same as well as dysphagia, gastroparesis, serrated adenoma of the colon.  At that time she noted a burning sensation in her mouth and from her throat to her stomach lasted a couple hours and associated with nausea.  Noted history of serrated adenomas in December 2016 and recommended flex sig with enema in December 2021.  Gastroparesis doing fairly well controlled and recommended continue Zofran for nausea.  GERD symptoms fairly well controlled recommend he continue omeprazole daily and Zofran as needed.  For IBS continue high-fiber diet, Gummies, Linzess.  Noted history of total colectomy in 2016.  Previous flexible sigmoidoscopy in 2016, normal rectum, normal anastomosis at 20 cm from the point of entry, small internal hemorrhoids.  Does not appear flexible sigmoidoscopy had been completed.  Today she states doing okay overall. GERD symptoms doing ok, about the same. Still has belching. Occasional burning in the esophagus. On omeprazole 20 mg bid, overall feels this is doing well for her, no desired changes. Overall Zofran controls her nausea well, no interested in changing medications at this time. Good appetite, able to keep foods down. Unable to eat steak because it upsets her stomach. Has occasional straining with constipation and after this will have normal stools. She knows it's time for updated flex sig, states she has previously needed to be put "completely under" (propofol). Denies hematochezia, melena, fever, chills,  unintentional weight loss. Denies URI or flu-like symptoms. Denies loss of sense of taste or smell.  The patient has not received COVID-19 vaccination(s). Denies chest pain, dyspnea, dizziness, lightheadedness, syncope, near syncope. Denies any other upper or lower GI symptoms.  Past Medical History:  Diagnosis Date  . Anxiety   . BMI (body mass index) 20.0-29.01 Apr 2010 172 LBS  . Chronic back pain   . Degenerative disc disease, thoracic   . Gastroparesis   . GERD (gastroesophageal reflux disease)   . Headache(784.0)   . IBS (irritable bowel syndrome)   . Incontinence of feces    WEARS DEPENDS AT HS    . Neuropathy   . PONV (postoperative nausea and vomiting)   . Pseudoobstruction of colon 08/24/2014  . Seasonal allergies   . Serrated adenoma of colon 05/2010    Past Surgical History:  Procedure Laterality Date  . ABLATION     endometrial ablation  . APPENDECTOMY    . BACK SURGERY  05/14/2011   Lumbar Fusion and cage  . BIOPSY  06/22/2015   Procedure: BIOPSY;  Surgeon: Danie Binder, MD;  Location: [facility];  Service: Endoscopy;;  gastric and esophageal bx's  . BREAST LUMPECTOMY     from the left breast 2003,  2016  . COLECTOMY N/A 08/24/2014   Procedure: TOTAL COLECTOMY;  Surgeon: Jamesetta So, MD;  Location: [facility];  Service: General;  Laterality: N/A;  . COLON SURGERY    . COLONOSCOPY  NOV 2011 SCREENING    HYPERPLASTIC RECTAL POLYP, SML IH, incomplete due  to discomfort  . COLONOSCOPY  DEC 2011 w/ PROPOFOL   SERRATED ADENOMA, HYPERPLASTIC POLY[S[  . ESOPHAGEAL DILATION    . ESOPHAGEAL MANOMETRY N/A 12/29/2017   Procedure: ESOPHAGEAL MANOMETRY (EM);  Surgeon: Mauri Pole, MD;  Location: [facility];  Service: Endoscopy;  Laterality: N/A;  . ESOPHAGOGASTRODUODENOSCOPY (EGD) WITH PROPOFOL N/A 06/22/2015   Procedure: ESOPHAGOGASTRODUODENOSCOPY (EGD) WITH PROPOFOL;  Surgeon: Danie Binder, MD;  [facility]: [facility];  Service: Endoscopy;  Laterality: N/A;  .  ESOPHAGOGASTRODUODENOSCOPY (EGD) WITH PROPOFOL N/A 01/21/2017   Procedure: ESOPHAGOGASTRODUODENOSCOPY (EGD) WITH PROPOFOL;  Surgeon: Danie Binder, MD;  [facility]: [facility];  Service: Endoscopy;  Laterality: N/A;  9:30am  . FLEXIBLE SIGMOIDOSCOPY N/A 06/22/2015   Procedure: FLEXIBLE SIGMOIDOSCOPY WITH PROPOFOL;  Surgeon: Danie Binder, MD;  [facility];  Service: Endoscopy;  Laterality: N/A;  0830   . HARDWARE REMOVAL N/A 09/03/2016   Procedure: Removal of Lumbar five-Sacrum one  hardware with Metrex;  Surgeon: Kristeen Miss, MD;  Location: [place];  Service: Neurosurgery;  Laterality: N/A;  . KNEE SURGERY Bilateral    2 on righ-1 scope and one open; and one on left  . MOUTH SURGERY  07/21/2013  . OVARIAN CYST REMOVAL    . SAVORY DILATION N/A 01/21/2017   Procedure: SAVORY DILATION;  Surgeon: Danie Binder, MD;  [facility];  Service: Endoscopy;  Laterality: N/A;  . SMART PILL PROCEDURE N/A 07/04/2014   Procedure: SMART PILL PROCEDURE;  Surgeon: Danie Binder, MD;  Location: [facility];  Service: Endoscopy;  Laterality: N/A;  800  . TUBAL LIGATION      Current Outpatient Medications  Medication Sig Dispense Refill  . Ascorbic Acid (VITAMIN C) 1000 MG tablet Take 1,000 mg by mouth in the morning and at bedtime.    . Cholecalciferol (VITAMIN D3) 50 MCG (2000 UT) TABS Take 1 tablet by mouth daily.    . Cyanocobalamin (B-12 COMPLIANCE INJECTION IJ) Inject as directed every 30 (thirty) days. Tapering    . diphenoxylate-atropine (LOMOTIL) 2.5-0.025 MG tablet Take 1 tablet by mouth 4 (four) times daily as needed for diarrhea or loose stools.    Marland Kitchen escitalopram (LEXAPRO) 10 MG tablet Take 20 mg by mouth daily.    . furosemide (LASIX) 20 MG tablet Take 20 mg by mouth 2 (two) times a week. Mondays and Fridays    . gabapentin (NEURONTIN) 600 MG tablet Take 600 mg by mouth 3 (three) times daily.     Marland Kitchen LINZESS 290 MCG CAPS capsule TAKE ONE CAPSULE BY MOUTH EVERY DAY 30  minutes BEFORE BREAKFAST (Patient taking differently: Take 290 mcg by mouth daily before breakfast. 69mns before breakfast) 90 capsule 3  . loperamide (IMODIUM A-D) 2 MG tablet Take 1 tablet (2 mg total) by mouth 4 (four) times daily as needed for diarrhea or loose stools. 30 tablet 0  . loratadine (CLARITIN) 10 MG tablet Take 10 mg by mouth daily.    . meloxicam (MOBIC) 15 MG tablet Take 15 mg by mouth daily.    . methocarbamol (ROBAXIN) 750 MG tablet Take 750 mg by mouth in the morning and at bedtime.    .Marland Kitchenomeprazole (PRILOSEC) 20 MG capsule TAKE ONE CAPSULE BY MOUTH ONE OR TWO TIMES DAILY 30 MINUTES BEFORE A MEAL (Patient taking differently: Take 20 mg by mouth 2 (two) times daily before a meal.) 60 capsule 11  . ondansetron (ZOFRAN) 4 MG tablet Take 1 tablet (4 mg total) by  mouth every 6 (six) hours as needed for nausea. 20 tablet 0  . Oxycodone HCl 10 MG TABS Take 5 mg by mouth in the morning, at noon, and at bedtime.    . rosuvastatin (CRESTOR) 5 MG tablet Take 5 mg by mouth daily.    . SUMAtriptan (IMITREX) 100 MG tablet Take 100 mg by mouth every 2 (two) hours as needed for migraine or headache. May repeat in 2 hours if headache persists or recurs.    . topiramate (TOPAMAX) 50 MG tablet Take 50 mg by mouth 2 (two) times daily.    . Zinc Sulfate 220 (50 Zn) MG TABS Take 1 tablet by mouth in the morning.     No current facility-administered medications for this visit.    Allergies as of 10/19/2020 - Review Complete 10/19/2020  Allergen Reaction Noted  . Aspirin Shortness Of Breath, Swelling, and Rash   . Bee venom Anaphylaxis 07/08/2013  . Eletriptan Anaphylaxis and Swelling 04/14/2020  . Codeine Nausea And Vomiting and Rash   . Penicillins Other (See Comments)     Family History  Problem Relation Age of Onset  . Breast cancer Maternal Aunt   . Colon cancer Neg Hx   . Colon polyps Neg Hx     Social History   Socioeconomic History  . Marital status: Married    Spouse name:  Not on file  . Number of children: Not on file  . Years of education: Not on file  . Highest education level: Not on file  Occupational History  . Not on file  Tobacco Use  . Smoking status: Current Every Day Smoker    Packs/day: 1.00    Years: 36.00    Pack years: 36.00    Types: Cigarettes  . Smokeless tobacco: Never Used  . Tobacco comment: vapes CBD oil  Vaping Use  . Vaping Use: Never used  Substance and Sexual Activity  . Alcohol use: Yes    Comment: once a year when they go to the beach  . Drug use: No  . Sexual activity: Yes    Birth control/protection: Surgical, Post-menopausal    Comment: tubal  Other Topics Concern  . Not on file  Social History Narrative   DAUGHTER IS SEEING IMPAIRED. LEARNING TO PLAY VIOLIN.   Social Determinants of Health   Financial Resource Strain: Not on file  Food Insecurity: Not on file  Transportation Needs: Not on file  Physical Activity: Not on file  Stress: Not on file  Social Connections: Not on file    Subjective: Review of Systems  Constitutional: Negative for chills, fever, malaise/fatigue and weight loss.  HENT: Negative for congestion and sore throat.   Respiratory: Negative for cough and shortness of breath.   Cardiovascular: Negative for chest pain and palpitations.  Gastrointestinal: Positive for constipation (rare/stable), heartburn (rare/stable) and nausea (well managed on Zofran). Negative for abdominal pain, blood in stool, diarrhea, melena and vomiting.  Musculoskeletal: Negative for joint pain and myalgias.  Skin: Negative for rash.  Neurological: Negative for dizziness and weakness.  Endo/Heme/Allergies: Does not bruise/bleed easily.  Psychiatric/Behavioral: Negative for depression. The patient is not nervous/anxious.   All other systems reviewed and are negative.    Objective: BP 117/77   Pulse 84   Temp (!) 97.5 F (36.4 C)   Ht _0  (1.6 m)   Wt 164 lb 3.2 oz (74.5 kg)   LMP 07/22/2011 Comment:  tubal ligation/ablation  BMI 29.09 kg/m  Physical Exam Vitals and  nursing note reviewed.  Constitutional:      General: She is not in acute distress.    Appearance: Normal appearance. She is well-developed and normal weight. She is not ill-appearing, toxic-appearing or diaphoretic.  HENT:     Head: Normocephalic and atraumatic.     Nose: No congestion or rhinorrhea.  Eyes:     General: No scleral icterus. Cardiovascular:     Rate and Rhythm: Normal rate and regular rhythm.     Heart sounds: Normal heart sounds.  Pulmonary:     Effort: Pulmonary effort is normal. No respiratory distress.     Breath sounds: Normal breath sounds.  Abdominal:     General: Bowel sounds are normal.     Palpations: Abdomen is soft. There is no hepatomegaly, splenomegaly or mass.     Tenderness: There is no abdominal tenderness. There is no guarding or rebound.     Hernia: No hernia is present.  Skin:    General: Skin is warm and dry.     Coloration: Skin is not jaundiced.     Findings: No rash.  Neurological:     General: No focal deficit present.     Mental Status: She is alert and oriented to person, place, and time.  Psychiatric:        Attention and Perception: Attention normal.        Mood and Affect: Mood normal.        Speech: Speech normal.        Behavior: Behavior normal.        Thought Content: Thought content normal.        Cognition and Memory: Cognition and memory normal.      Assessment:  Very pleasant 50 year old female presents to follow-up on GERD, gastroparesis with nausea, constipation, and history of colon polyps status post subtotal colectomy.  Overall she is doing well, no red flag/warning signs or symptoms.  GERD: Stable on omeprazole twice daily.  Occasional/rare breakthrough but generally feels she is doing well and does not want to resume medications.  Recommend she continue her current medicines and follow-up in a year or as needed  Constipation: Generally well  managed on Linzess 290 mcg daily.  Occasional/rare straining that resolves on its own after 1 episode.  Recommend continue current medications  Gastroparesis with nausea: Has daily nausea, taking Zofran twice daily which she feels manages her symptoms well.  Not interested in an trial of Reglan after discussion of possible, though rare, side effects.  She feels she is doing well with this.  History of colon polyp status post subtotal colectomy: She is currently due for updated flexible sigmoidoscopy.  We will proceed with scheduling at this time.  We will plan for an enema only prep which is resulted in excellent prep in the past.   Proceed with flexible sigmoidoscopy on propofol/MAC with Dr. Abbey Chatters on propofol/MAC in near future: the risks, benefits, and alternatives have been discussed with the patient in detail. The patient states understanding and desires to proceed.  ASA II   Plan: 1. Continue current medications 2. Flexible sigmoidoscopy as described above 3. Follow-up in 1 year or as needed    Thank you for allowing [place] to participate in the care of [place], DNP, AGNP-C Adult & Gerontological Nurse Practitioner [facility]   10/19/2020 1:46 PM   Disclaimer: This note was dictated with voice recognition software. Similar sounding words can inadvertently be transcribed and may not be corrected upon  review.  

## 2020-11-21 ENCOUNTER — Other Ambulatory Visit (HOSPITAL_COMMUNITY): Payer: Self-pay | Admitting: *Deleted

## 2020-11-22 ENCOUNTER — Other Ambulatory Visit (HOSPITAL_COMMUNITY): Payer: Self-pay | Admitting: *Deleted

## 2020-11-23 ENCOUNTER — Other Ambulatory Visit (HOSPITAL_COMMUNITY)
Admission: RE | Admit: 2020-11-23 | Discharge: 2020-11-23 | Disposition: A | Discharge status: Home / Self Care | Payer: Medicaid Other | Source: Ambulatory Visit | Attending: Internal Medicine | Admitting: Internal Medicine

## 2020-11-23 ENCOUNTER — Other Ambulatory Visit: Payer: Self-pay

## 2020-11-23 ENCOUNTER — Other Ambulatory Visit (HOSPITAL_COMMUNITY): Payer: Self-pay | Admitting: *Deleted

## 2020-11-23 DIAGNOSIS — Z87892 Personal history of anaphylaxis: Secondary | ICD-10-CM | POA: Diagnosis not present

## 2020-11-23 DIAGNOSIS — Z886 Allergy status to analgesic agent status: Secondary | ICD-10-CM | POA: Diagnosis not present

## 2020-11-23 DIAGNOSIS — Z880 Allergy status to penicillin: Secondary | ICD-10-CM | POA: Diagnosis not present

## 2020-11-23 DIAGNOSIS — Z91030 Bee allergy status: Secondary | ICD-10-CM | POA: Diagnosis not present

## 2020-11-23 DIAGNOSIS — Z1211 Encounter for screening for malignant neoplasm of colon: Secondary | ICD-10-CM | POA: Diagnosis not present

## 2020-11-23 DIAGNOSIS — Z79899 Other long term (current) drug therapy: Secondary | ICD-10-CM | POA: Diagnosis not present

## 2020-11-23 DIAGNOSIS — Z86010 Personal history of colonic polyps: Secondary | ICD-10-CM | POA: Diagnosis present

## 2020-11-23 DIAGNOSIS — Z9049 Acquired absence of other specified parts of digestive tract: Secondary | ICD-10-CM | POA: Diagnosis not present

## 2020-11-23 DIAGNOSIS — Z888 Allergy status to other drugs, medicaments and biological substances status: Secondary | ICD-10-CM | POA: Diagnosis not present

## 2020-11-23 DIAGNOSIS — Z980 Intestinal bypass and anastomosis status: Secondary | ICD-10-CM | POA: Diagnosis not present

## 2020-11-23 DIAGNOSIS — K635 Polyp of colon: Secondary | ICD-10-CM | POA: Diagnosis not present

## 2020-11-23 DIAGNOSIS — F17210 Nicotine dependence, cigarettes, uncomplicated: Secondary | ICD-10-CM | POA: Diagnosis not present

## 2020-11-23 DIAGNOSIS — Z885 Allergy status to narcotic agent status: Secondary | ICD-10-CM | POA: Diagnosis not present

## 2020-11-23 DIAGNOSIS — Z791 Long term (current) use of non-steroidal anti-inflammatories (NSAID): Secondary | ICD-10-CM | POA: Diagnosis not present

## 2020-11-23 LAB — BASIC METABOLIC PANEL
Anion gap: 8 (ref 5–15)
BUN: 18 mg/dL (ref 6–20)
CO2: 24 mmol/L (ref 22–32)
Calcium: 9.6 mg/dL (ref 8.9–10.3)
Chloride: 107 mmol/L (ref 98–111)
Creatinine, Ser: 0.8 mg/dL (ref 0.44–1.00)
GFR, Estimated: >60 mL/min (ref >60)
Glucose, Bld: 109 mg/dL — ABNORMAL HIGH (ref 70–99)
Potassium: 4 mmol/L (ref 3.5–5.1)
Sodium: 139 mmol/L (ref 135–145)

## 2020-11-24 ENCOUNTER — Ambulatory Visit (HOSPITAL_COMMUNITY)
Admission: RE | Admit: 2020-11-24 | Discharge: 2020-11-24 | Disposition: A | Discharge status: Home / Self Care | Payer: Medicaid Other | Attending: Internal Medicine | Admitting: Internal Medicine

## 2020-11-24 ENCOUNTER — Ambulatory Visit (HOSPITAL_COMMUNITY): Payer: Medicaid Other | Admitting: Anesthesiology

## 2020-11-24 ENCOUNTER — Encounter (HOSPITAL_COMMUNITY)
Admission: RE | Disposition: A | Discharge status: Home / Self Care | Payer: Self-pay | Source: Home / Self Care | Attending: Internal Medicine

## 2020-11-24 ENCOUNTER — Other Ambulatory Visit: Payer: Self-pay

## 2020-11-24 ENCOUNTER — Encounter (HOSPITAL_COMMUNITY): Payer: Self-pay

## 2020-11-24 DIAGNOSIS — Z886 Allergy status to analgesic agent status: Secondary | ICD-10-CM | POA: Insufficient documentation

## 2020-11-24 DIAGNOSIS — Z87892 Personal history of anaphylaxis: Secondary | ICD-10-CM | POA: Insufficient documentation

## 2020-11-24 DIAGNOSIS — Z79899 Other long term (current) drug therapy: Secondary | ICD-10-CM | POA: Insufficient documentation

## 2020-11-24 DIAGNOSIS — Z1211 Encounter for screening for malignant neoplasm of colon: Secondary | ICD-10-CM | POA: Diagnosis not present

## 2020-11-24 DIAGNOSIS — K635 Polyp of colon: Secondary | ICD-10-CM | POA: Insufficient documentation

## 2020-11-24 DIAGNOSIS — Z86010 Personal history of colonic polyps: Secondary | ICD-10-CM | POA: Diagnosis not present

## 2020-11-24 DIAGNOSIS — F17210 Nicotine dependence, cigarettes, uncomplicated: Secondary | ICD-10-CM | POA: Insufficient documentation

## 2020-11-24 DIAGNOSIS — Z888 Allergy status to other drugs, medicaments and biological substances status: Secondary | ICD-10-CM | POA: Insufficient documentation

## 2020-11-24 DIAGNOSIS — Z980 Intestinal bypass and anastomosis status: Secondary | ICD-10-CM | POA: Insufficient documentation

## 2020-11-24 DIAGNOSIS — Z885 Allergy status to narcotic agent status: Secondary | ICD-10-CM | POA: Insufficient documentation

## 2020-11-24 DIAGNOSIS — Z880 Allergy status to penicillin: Secondary | ICD-10-CM | POA: Insufficient documentation

## 2020-11-24 DIAGNOSIS — Z91030 Bee allergy status: Secondary | ICD-10-CM | POA: Insufficient documentation

## 2020-11-24 DIAGNOSIS — Z9049 Acquired absence of other specified parts of digestive tract: Secondary | ICD-10-CM | POA: Insufficient documentation

## 2020-11-24 DIAGNOSIS — Z791 Long term (current) use of non-steroidal anti-inflammatories (NSAID): Secondary | ICD-10-CM | POA: Insufficient documentation

## 2020-11-24 HISTORY — PX: FLEXIBLE SIGMOIDOSCOPY: SHX5431

## 2020-11-24 HISTORY — PX: POLYPECTOMY: SHX5525

## 2020-11-24 SURGERY — SIGMOIDOSCOPY, FLEXIBLE
Anesthesia: General

## 2020-11-24 MED ORDER — PROPOFOL 10 MG/ML IV BOLUS
INTRAVENOUS | Status: DC | PRN
  Administered 2020-11-24 (×2): 20 mg via INTRAVENOUS
  Administered 2020-11-24: 80 mg via INTRAVENOUS
  Administered 2020-11-24: 20 mg via INTRAVENOUS
  Administered 2020-11-24: 40 mg via INTRAVENOUS

## 2020-11-24 MED ORDER — LACTATED RINGERS IV SOLN
INTRAVENOUS | Status: DC
  Administered 2020-11-24: 1000 mL via INTRAVENOUS

## 2020-11-24 MED ORDER — LIDOCAINE HCL (CARDIAC) PF 100 MG/5ML IV SOSY
PREFILLED_SYRINGE | INTRAVENOUS | Status: DC | PRN
  Administered 2020-11-24: 40 mg via INTRATRACHEAL

## 2020-11-24 MED ORDER — STERILE WATER FOR IRRIGATION IR SOLN
Status: DC | PRN
  Administered 2020-11-24: 1.5 mL

## 2020-11-24 NOTE — Anesthesia Postprocedure Evaluation (Signed)
Anesthesia Post Note  Patient: [name]  Procedure(s) Performed: FLEXIBLE SIGMOIDOSCOPY (N/A ) POLYPECTOMY  Patient location during evaluation: Phase II Anesthesia Type: General Level of consciousness: awake Pain management: pain level controlled Vital Signs Assessment: post-procedure vital signs reviewed and stable Respiratory status: spontaneous breathing and respiratory function stable Cardiovascular status: blood pressure returned to baseline and stable Postop Assessment: no headache and no apparent nausea or vomiting Anesthetic complications: no Comments: Late entry   No complications documented.   Last Vitals:  Vitals:   11/24/20 0914 11/24/20 1009  BP: 111/74 101/67  Pulse: 73   Resp: 12 16  Temp: 36.7 C 36.7 C  SpO2: 98% 99%    Last Pain:  Vitals:   11/24/20 1009  TempSrc: Oral  PainSc: 0-No pain                 Louann Sjogren

## 2020-11-24 NOTE — Discharge Instructions (Addendum)
Flexible Sigmoidoscopy Discharge Instructions  Read the instructions outlined below and refer to this sheet in the next few weeks. These discharge instructions provide you with general information on caring for yourself after you leave the hospital. Your doctor may also give you specific instructions. While your treatment has been planned according to the most current medical practices available, unavoidable complications occasionally occur.   ACTIVITY  You may resume your regular activity, but move at a slower pace for the next 24 hours.   Take frequent rest periods for the next 24 hours.   Walking will help get rid of the air and reduce the bloated feeling in your belly (abdomen).   No driving for 24 hours (because of the medicine (anesthesia) used during the test).    Do not sign any important legal documents or operate any machinery for 24 hours (because of the anesthesia used during the test).  NUTRITION  Drink plenty of fluids.   You may resume your normal diet as instructed by your doctor.   Begin with a light meal and progress to your normal diet. Heavy or fried foods are harder to digest and may make you feel sick to your stomach (nauseated).   Avoid alcoholic beverages for 24 hours or as instructed.  MEDICATIONS  You may resume your normal medications unless your doctor tells you otherwise.  WHAT YOU CAN EXPECT TODAY  Some feelings of bloating in the abdomen.   Passage of more gas than usual.   Spotting of blood in your stool or on the toilet paper.  IF YOU HAD POLYPS REMOVED DURING THE COLONOSCOPY:  No aspirin products for 7 days or as instructed.   No alcohol for 7 days or as instructed.   Eat a soft diet for the next 24 hours.  FINDING OUT THE RESULTS OF YOUR TEST Not all test results are available during your visit. If your test results are not back during the visit, make an appointment with your caregiver to find out the results. Do not assume everything is  normal if you have not heard from your caregiver or the medical facility. It is important for you to follow up on all of your test results.  SEEK IMMEDIATE MEDICAL ATTENTION IF:  You have more than a spotting of blood in your stool.   Your belly is swollen (abdominal distention).   You are nauseated or vomiting.   You have a temperature over 101.   You have abdominal pain or discomfort that is severe or gets worse throughout the day.   Your flexible sigmoidoscopy revealed 1 polyp which I removed successfully. Await pathology results, my office will contact you. I recommend repeating flexible sigmoidoscopy in 5 years for surveillance purposes.  Follow-up in 1 year with GI or sooner if needed.   I hope you have a great rest of your week!  Elon Alas. Abbey Chatters, D.O. Gastroenterology and Hepatology [facility]   Colon Polyps  Colon polyps are tissue growths inside the colon, which is part of the large intestine. They are one of the types of polyps that can grow in the body. A polyp may be a round bump or a mushroom-shaped growth. You could have one polyp or more than one. Most colon polyps are noncancerous (benign). However, some colon polyps can become cancerous over time. Finding and removing the polyps early can help prevent this. What are the causes? The exact cause of colon polyps is not known. What increases the risk? The following factors  may make you more likely to develop this condition:  Having a family history of colorectal cancer or colon polyps.  Being older than 51 years of age.  Being younger than 51 years of age and having a significant family history of colorectal cancer or colon polyps or a genetic condition that puts you at higher risk of getting colon polyps.  Having inflammatory bowel disease, such as ulcerative colitis or Crohn's disease.  Having certain conditions passed from parent to child (hereditary conditions), such  as: ? Familial adenomatous polyposis (FAP). ? Lynch syndrome. ? Turcot syndrome. ? Peutz-Jeghers syndrome. ? MUTYH-associated polyposis (MAP).  Being overweight.  Certain lifestyle factors. These include smoking cigarettes, drinking too much alcohol, not getting enough exercise, and eating a diet that is high in fat and red meat and low in fiber.  Having had childhood cancer that was treated with radiation of the abdomen. What are the signs or symptoms? Many times, there are no symptoms. If you have symptoms, they may include:  Blood coming from the rectum during a bowel movement.  Blood in the stool (feces). The blood may be bright red or very dark in color.  Pain in the abdomen.  A change in bowel habits, such as constipation or diarrhea. How is this diagnosed? This condition is diagnosed with a colonoscopy. This is a procedure in which a lighted, flexible scope is inserted into the opening between the buttocks (anus) and then passed into the colon to examine the area. Polyps are sometimes found when a colonoscopy is done as part of routine cancer screening tests. How is this treated? This condition is treated by removing any polyps that are found. Most polyps can be removed during a colonoscopy. Those polyps will then be tested for cancer. Additional treatment may be needed depending on the results of testing. Follow these instructions at home: Eating and drinking  Eat foods that are high in fiber, such as fruits, vegetables, and whole grains.  Eat foods that are high in calcium and vitamin D, such as milk, cheese, yogurt, eggs, liver, fish, and broccoli.  Limit foods that are high in fat, such as fried foods and desserts.  Limit the amount of red meat, precooked or cured meat, or other processed meat that you eat, such as hot dogs, sausages, bacon, or meat loaves.  Limit sugary drinks.   Lifestyle  Maintain a healthy weight, or lose weight if recommended by your health  care provider.  Exercise every day or as told by your health care provider.  Do not use any products that contain nicotine or tobacco, such as cigarettes, e-cigarettes, and chewing tobacco. If you need help quitting, ask your health care provider.  Do not drink alcohol if: ? Your health care provider tells you not to drink. ? You are pregnant, may be pregnant, or are planning to become pregnant.  If you drink alcohol: ? Limit how much you use to:  0-1 drink a day for women.  0-2 drinks a day for men. ? Know how much alcohol is in your drink. In the [place] one drink equals one 12 oz bottle of beer (355 mL), one 5 oz glass of wine (148 mL), or one 1 oz glass of hard liquor (44 mL). General instructions  Take over-the-counter and prescription medicines only as told by your health care provider.  Keep all follow-up visits. This is important. This includes having regularly scheduled colonoscopies. Talk to your health care provider about when you need a colonoscopy.  Contact a health care provider if:  You have new or worsening bleeding during a bowel movement.  You have new or increased blood in your stool.  You have a change in bowel habits.  You lose weight for no known reason. Summary  Colon polyps are tissue growths inside the colon, which is part of the large intestine. They are one type of polyp that can grow in the body.  Most colon polyps are noncancerous (benign), but some can become cancerous over time.  This condition is diagnosed with a colonoscopy.  This condition is treated by removing any polyps that are found. Most polyps can be removed during a colonoscopy. This information is not intended to replace advice given to you by your health care provider. Make sure you discuss any questions you have with your health care provider. Document Revised: 09/29/2019 Document Reviewed: 09/29/2019 Elsevier Patient Education  2021 [employer].

## 2020-11-24 NOTE — Transfer of Care (Signed)
Immediate Anesthesia Transfer of Care Note  Patient: Alicia Tyler  Procedure(s) Performed: FLEXIBLE SIGMOIDOSCOPY (N/A ) POLYPECTOMY  Patient Location: Endoscopy Unit  Anesthesia Type:MAC  Level of Consciousness: sedated and responds to stimulation  Airway & Oxygen Therapy: Patient Spontanous Breathing and Patient connected to nasal cannula oxygen  Post-op Assessment: Report given to RN, Post -op Vital signs reviewed and stable and Patient moving all extremities  Post vital signs: Reviewed and stable  Last Vitals:  Vitals Value Taken Time  BP    Temp 36.7 C 11/24/20 1009  Pulse    Resp    SpO2      Last Pain:  Vitals:   11/24/20 1009  TempSrc: Oral  PainSc: 0-No pain      Patients Stated Pain Goal: 5 (58/59/29 2446)  Complications: No complications documented.

## 2020-11-24 NOTE — H&P (Signed)
Primary Care Physician:  Monico Blitz, MD Primary Gastroenterologist:  Dr. Abbey Chatters  Pre-Procedure History & Physical: HPI:  Alicia Tyler is a 50 y.o. female is here for a flexible sigmoidoscopy due to personal history of numerous serrated adenomas s/p subtotal colectomy.   Past Medical History:  Diagnosis Date  . Anxiety   . BMI (body mass index) 20.0-29.01 Apr 2010 172 LBS  . Chronic back pain   . Degenerative disc disease, thoracic   . Gastroparesis   . GERD (gastroesophageal reflux disease)   . Headache(784.0)   . IBS (irritable bowel syndrome)   . Incontinence of feces    WEARS DEPENDS AT HS    . Neuropathy   . PONV (postoperative nausea and vomiting)   . Pseudoobstruction of colon 08/24/2014  . Seasonal allergies   . Serrated adenoma of colon 05/2010    Past Surgical History:  Procedure Laterality Date  . ABLATION     endometrial ablation  . APPENDECTOMY    . BACK SURGERY  05/14/2011   Lumbar Fusion and cage  . BIOPSY  06/22/2015   Procedure: BIOPSY;  Surgeon: Danie Binder, MD;  Location: [facility];  Service: Endoscopy;;  gastric and esophageal bx's  . BREAST LUMPECTOMY     from the left breast 2003,  2016  . COLECTOMY N/A 08/24/2014   Procedure: TOTAL COLECTOMY;  Surgeon: Jamesetta So, MD;  Location: [facility];  Service: General;  Laterality: N/A;  . COLON SURGERY    . COLONOSCOPY  NOV 2011 SCREENING    HYPERPLASTIC RECTAL POLYP, SML IH, incomplete due to discomfort  . COLONOSCOPY  DEC 2011 w/ PROPOFOL   SERRATED ADENOMA, HYPERPLASTIC POLY[S[  . ESOPHAGEAL DILATION    . ESOPHAGEAL MANOMETRY N/A 12/29/2017   Procedure: ESOPHAGEAL MANOMETRY (EM);  Surgeon: Mauri Pole, MD;  Location: [facility];  Service: Endoscopy;  Laterality: N/A;  . ESOPHAGOGASTRODUODENOSCOPY (EGD) WITH PROPOFOL N/A 06/22/2015   Procedure: ESOPHAGOGASTRODUODENOSCOPY (EGD) WITH PROPOFOL;  Surgeon: Danie Binder, MD;  [facility];  Service: Endoscopy;  Laterality: N/A;  .  ESOPHAGOGASTRODUODENOSCOPY (EGD) WITH PROPOFOL N/A 01/21/2017   Procedure: ESOPHAGOGASTRODUODENOSCOPY (EGD) WITH PROPOFOL;  Surgeon: Danie Binder, MD;  [facility]: [facility];  Service: Endoscopy;  Laterality: N/A;  9:30am  . FLEXIBLE SIGMOIDOSCOPY N/A 06/22/2015   Procedure: FLEXIBLE SIGMOIDOSCOPY WITH PROPOFOL;  Surgeon: Danie Binder, MD;  [facility];  Service: Endoscopy;  Laterality: N/A;  0830   . HARDWARE REMOVAL N/A 09/03/2016   Procedure: Removal of Lumbar five-Sacrum one  hardware with Metrex;  Surgeon: Kristeen Miss, MD;  Location: [facility];  Service: Neurosurgery;  Laterality: N/A;  . KNEE SURGERY Bilateral    2 on righ-1 scope and one open; and one on left  . MOUTH SURGERY  07/21/2013  . OVARIAN CYST REMOVAL    . SAVORY DILATION N/A 01/21/2017   Procedure: SAVORY DILATION;  Surgeon: Danie Binder, MD;  [facility];  Service: Endoscopy;  Laterality: N/A;  . SMART PILL PROCEDURE N/A 07/04/2014   Procedure: SMART PILL PROCEDURE;  Surgeon: Danie Binder, MD;  [facility]: [facility];  Service: Endoscopy;  Laterality: N/A;  800  . TUBAL LIGATION      Prior to Admission medications   Medication Sig Start Date End Date Taking? Authorizing Provider  Ascorbic Acid (VITAMIN C) 1000 MG tablet Take 1,000 mg by mouth in the morning and at bedtime.   Yes [provider]  Cholecalciferol (VITAMIN  D3) 50 MCG (2000 UT) TABS Take 2,000 Units by mouth daily.   Yes [provider]  Cyanocobalamin (B-12 COMPLIANCE INJECTION IJ) Inject as directed every 30 (thirty) days. Tapering   Yes [provider]  escitalopram (LEXAPRO) 20 MG tablet Take 20 mg by mouth daily.   Yes [provider]  furosemide (LASIX) 20 MG tablet Take 20 mg by mouth 2 (two) times a week. Mondays and Fridays   Yes [provider]  gabapentin (NEURONTIN) 600 MG tablet Take 600 mg by mouth 3 (three) times daily.    Yes [provider]  LINZESS 290 MCG  CAPS capsule TAKE ONE CAPSULE BY MOUTH EVERY DAY 30 minutes BEFORE BREAKFAST Patient taking differently: Take 290 mcg by mouth daily before breakfast. 66mins before breakfast 09/15/20  Yes Carlis Stable, NP  loperamide (IMODIUM A-D) 2 MG tablet Take 1 tablet (2 mg total) by mouth 4 (four) times daily as needed for diarrhea or loose stools. 09/30/20  Yes Shah, Pratik D, DO  loratadine (CLARITIN) 10 MG tablet Take 10 mg by mouth daily.   Yes [provider]  meloxicam (MOBIC) 15 MG tablet Take 15 mg by mouth daily.   Yes [provider]  methocarbamol (ROBAXIN) 750 MG tablet Take 750 mg by mouth in the morning and at bedtime.   Yes [provider]  omeprazole (PRILOSEC) 20 MG capsule TAKE ONE CAPSULE BY MOUTH ONE OR TWO TIMES DAILY 30 MINUTES BEFORE A MEAL Patient taking differently: Take 20 mg by mouth 2 (two) times daily before a meal. 06/13/20  Yes Jodi Mourning, Kristen S, PA-C  ondansetron (ZOFRAN-ODT) 4 MG disintegrating tablet Take 4 mg by mouth daily as needed for nausea. 10/24/20  Yes [provider]  Oxycodone HCl 10 MG TABS Take 10 mg by mouth in the morning, at noon, and at bedtime.   Yes [provider]  rosuvastatin (CRESTOR) 5 MG tablet Take 5 mg by mouth daily.   Yes [provider]  SUMAtriptan (IMITREX) 100 MG tablet Take 100 mg by mouth every 2 (two) hours as needed for migraine or headache. May repeat in 2 hours if headache persists or recurs.   Yes [provider]  Tetrahydroz-Dextran-PEG-Povid (VISINE ADVANCED RELIEF) 0.05-0.1-1-1 % SOLN Place 2 drops into both eyes 2 (two) times daily. Allergy relief   Yes [provider]  topiramate (TOPAMAX) 50 MG tablet Take 50 mg by mouth 2 (two) times daily.   Yes [provider]  Zinc Sulfate 220 (50 Zn) MG TABS Take 220 mg by mouth in the morning.   Yes [provider]    Allergies as of 10/19/2020 - Review Complete 10/19/2020  Allergen Reaction Noted  .  Aspirin Shortness Of Breath, Swelling, and Rash   . Bee venom Anaphylaxis 07/08/2013  . Eletriptan Anaphylaxis and Swelling 04/14/2020  . Codeine Nausea And Vomiting and Rash   . Penicillins Other (See Comments)     Family History  Problem Relation Age of Onset  . Breast cancer Maternal Aunt   . Colon cancer Neg Hx   . Colon polyps Neg Hx     Social History   Socioeconomic History  . Marital status: Married    Spouse name: Not on file  . Number of children: Not on file  . Years of education: Not on file  . Highest education level: Not on file  Occupational History  . Not on file  Tobacco Use  . Smoking status: Current Every Day  Smoker    Packs/day: 1.00    Years: 36.00    Pack years: 36.00    Types: Cigarettes  . Smokeless tobacco: Never Used  . Tobacco comment: vapes CBD oil  Vaping Use  . Vaping Use: Some days  Substance and Sexual Activity  . Alcohol use: Yes    Comment: once a year when they go to the beach  . Drug use: No  . Sexual activity: Yes    Birth control/protection: Surgical, Post-menopausal    Comment: tubal  Other Topics Concern  . Not on file  Social History Narrative   DAUGHTER IS SEEING IMPAIRED. LEARNING TO PLAY VIOLIN.   Social Determinants of Health   Financial Resource Strain: Not on file  Food Insecurity: Not on file  Transportation Needs: Not on file  Physical Activity: Not on file  Stress: Not on file  Social Connections: Not on file  Intimate Partner Violence: Not on file    Review of Systems: See HPI, otherwise negative ROS  Physical Exam: Vital signs in last 24 hours: Temp:  [98.1 F (36.7 C)] 98.1 F (36.7 C) (06/03 0914) Pulse Rate:  [73] 73 (06/03 0914) Resp:  [12] 12 (06/03 0914) BP: (111)/(74) 111/74 (06/03 0914) SpO2:  [98 %] 98 % (06/03 0914) Weight:  [72.6 kg] 72.6 kg (06/03 0914)   General:   Alert,  Well-developed, well-nourished, pleasant and cooperative in NAD Head:  Normocephalic and atraumatic. Eyes:   Sclera clear, no icterus.   Conjunctiva pink. Ears:  Normal auditory acuity. Nose:  No deformity, discharge,  or lesions. Mouth:  No deformity or lesions, dentition normal. Neck:  Supple; no masses or thyromegaly. Lungs:  Clear throughout to auscultation.   No wheezes, crackles, or rhonchi. No acute distress. Heart:  Regular rate and rhythm; no murmurs, clicks, rubs,  or gallops. Abdomen:  Soft, nontender and nondistended. No masses, hepatosplenomegaly or hernias noted. Normal bowel sounds, without guarding, and without rebound.   Msk:  Symmetrical without gross deformities. Normal posture. Extremities:  Without clubbing or edema. Neurologic:  Alert and  oriented x4;  grossly normal neurologically. Skin:  Intact without significant lesions or rashes. Cervical Nodes:  No significant cervical adenopathy. Psych:  Alert and cooperative. Normal mood and affect.  Impression/Plan: Alicia Tyler is here for a flexible sigmoidoscopy due to personal history of numerous serrated adenomas s/p subtotal colectomy.   The risks of the procedure including infection, bleed, or perforation as well as benefits, limitations, alternatives and imponderables have been reviewed with the patient. Questions have been answered. All parties agreeable.

## 2020-11-24 NOTE — Op Note (Signed)
[facility] Patient Name: Alicia Tyler Procedure Date: 11/24/2020 9:48 AM MRN: [id number] Date of Birth: 1971-05-15 Attending MD: Elon Alas. Abbey Chatters DO CSN: [id number] Age: 50 Admit Type: Outpatient Procedure:                Flexible Sigmoidoscopy Indications:              High risk colon cancer surveillance: Personal                            history of traditional serrated adenoma of the colon Providers:                Elon Alas. Abbey Chatters, DO, Caprice Kluver, Casimer Bilis, Technician Referring MD:              Medicines:                See the Anesthesia note for documentation of the                            administered medications Complications:            No immediate complications. Estimated Blood Loss:     Estimated blood loss was minimal. Procedure:                Pre-Anesthesia Assessment:                           - The anesthesia plan was to use monitored                            anesthesia care (MAC).                           After obtaining informed consent, the scope was                            passed under direct vision. The PCF-HQ190L                            ([id number]) scope was introduced through the anus and                            advanced to the the ileo-sigmoid anastomosis. The                            flexible sigmoidoscopy was accomplished without                            difficulty. The patient tolerated the procedure                            well. The quality of the bowel preparation was good. Scope In: 10:01:23 AM Scope Out: 10:07:09 AM Total Procedure Duration: 0 hours 5 minutes 46 seconds  Findings:      The perianal and digital rectal  examinations were normal.      A 2 mm polyp was found in the sigmoid colon. The polyp was sessile. The       polyp was removed with a cold biopsy forceps. Resection and retrieval       were complete.      There was evidence of a prior end-to-side ileo-colonic  anastomosis in       the sigmoid colon approx 25 cm from the anal verge. This was patent and       was characterized by healthy appearing mucosa. The anastomosis was       traversed. Impression:               - One 2 mm polyp in the sigmoid colon, removed with                            a cold biopsy forceps. Resected and retrieved.                           - Patent end-to-side ileo-colonic anastomosis,                            characterized by healthy appearing mucosa. Moderate Sedation:      Per Anesthesia Care Recommendation:           - Discharge patient to home.                           - Resume previous diet.                           - Await pathology results.                           - Return to GI clinic in 1 year. Procedure Code(s):        --- Professional ---                           [phone], Sigmoidoscopy, flexible; with biopsy, single                            or multiple Diagnosis Code(s):        --- Professional ---                           Z86.010, Personal history of colonic polyps                           K63.5, Polyp of colon                           Z98.0, Intestinal bypass and anastomosis status CPT copyright 2019 American Medical Association. All rights reserved. The codes documented in this report are preliminary and upon coder review may  be revised to meet current compliance requirements. Elon Alas. Abbey Chatters, DO [place] Abbey Chatters, DO 11/24/2020 10:22:43 AM This report has been signed electronically. Number of Addenda: 0

## 2020-11-24 NOTE — Anesthesia Preprocedure Evaluation (Signed)
Anesthesia Evaluation  Patient identified by MRN, date of birth, ID band Patient awake    Reviewed: Allergy & Precautions, H&P , NPO status , Patient's Chart, lab work & pertinent test results, reviewed documented beta blocker date and time   History of Anesthesia Complications (+) PONV and history of anesthetic complications  Airway Mallampati: II  TM Distance: >3 FB Neck ROM: full    Dental no notable dental hx.    Pulmonary neg pulmonary ROS, Current Smoker,    Pulmonary exam normal breath sounds clear to auscultation       Cardiovascular Exercise Tolerance: Good negative cardio ROS   Rhythm:regular Rate:Normal     Neuro/Psych  Headaches, PSYCHIATRIC DISORDERS Anxiety    GI/Hepatic Neg liver ROS, GERD  Medicated,  Endo/Other  negative endocrine ROS  Renal/GU   negative genitourinary   Musculoskeletal   Abdominal   Peds  Hematology negative hematology ROS (+)   Anesthesia Other Findings   Reproductive/Obstetrics negative OB ROS                             Anesthesia Physical Anesthesia Plan  ASA: III  Anesthesia Plan: General   Post-op Pain Management:    Induction:   PONV Risk Score and Plan: Propofol infusion  Airway Management Planned:   Additional Equipment:   Intra-op Plan:   Post-operative Plan:   Informed Consent: I have reviewed the patients History and Physical, chart, labs and discussed the procedure including the risks, benefits and alternatives for the proposed anesthesia with the patient or authorized representative who has indicated his/her understanding and acceptance.     Dental Advisory Given  Plan Discussed with: CRNA  Anesthesia Plan Comments:         Anesthesia Quick Evaluation

## 2020-11-28 LAB — SURGICAL PATHOLOGY

## 2020-12-04 ENCOUNTER — Encounter (HOSPITAL_COMMUNITY): Payer: Self-pay | Admitting: Internal Medicine

## 2020-12-05 ENCOUNTER — Encounter (HOSPITAL_COMMUNITY): Payer: Self-pay

## 2020-12-05 ENCOUNTER — Other Ambulatory Visit: Payer: Self-pay

## 2020-12-05 ENCOUNTER — Encounter (HOSPITAL_COMMUNITY)
Admission: RE | Admit: 2020-12-05 | Discharge: 2020-12-05 | Disposition: A | Discharge status: Home / Self Care | Payer: Medicaid Other | Source: Ambulatory Visit | Attending: Orthopedic Surgery | Admitting: Orthopedic Surgery

## 2020-12-05 DIAGNOSIS — Z01812 Encounter for preprocedural laboratory examination: Secondary | ICD-10-CM | POA: Insufficient documentation

## 2020-12-05 LAB — CBC
HCT: 43.1 % (ref 36.0–46.0)
Hemoglobin: 13.9 g/dL (ref 12.0–15.0)
MCH: 29.3 pg (ref 26.0–34.0)
MCHC: 32.3 g/dL (ref 30.0–36.0)
MCV: 90.7 fL (ref 80.0–100.0)
Platelets: 236 10*3/uL (ref 150–400)
RBC: 4.75 MIL/uL (ref 3.87–5.11)
RDW: 13.1 % (ref 11.5–15.5)
WBC: 9 10*3/uL (ref 4.0–10.5)
nRBC: 0 % (ref 0.0–0.2)

## 2020-12-05 LAB — BASIC METABOLIC PANEL
Anion gap: 9 (ref 5–15)
BUN: 22 mg/dL — ABNORMAL HIGH (ref 6–20)
CO2: 23 mmol/L (ref 22–32)
Calcium: 9.8 mg/dL (ref 8.9–10.3)
Chloride: 106 mmol/L (ref 98–111)
Creatinine, Ser: 0.84 mg/dL (ref 0.44–1.00)
GFR, Estimated: >60 mL/min (ref >60)
Glucose, Bld: 97 mg/dL (ref 70–99)
Potassium: 4.4 mmol/L (ref 3.5–5.1)
Sodium: 138 mmol/L (ref 135–145)

## 2020-12-05 LAB — SURGICAL PCR SCREEN
MRSA, PCR: NEGATIVE
Staphylococcus aureus: NEGATIVE

## 2020-12-05 NOTE — Progress Notes (Signed)
Anesthesia Review:  PCP:   DR Brigitte Pulse with [facility]  Cardiologist : Chest x-ray : EKG : 06/24/2020 - have rquested by fax fron [facility]  Echo : Stress test: Cardiac Cath :  Activity level: can do a flight of stairs without  difficuoty  Sleep Study/ CPAP : none  Fasting Blood Sugar :      / Checks Blood Sugar -- times a day:   Blood Thinner/ Instructions /Last Dose: ASA / Instructions/ Last Dose :

## 2020-12-12 ENCOUNTER — Other Ambulatory Visit (HOSPITAL_COMMUNITY)
Admission: RE | Admit: 2020-12-12 | Discharge: 2020-12-12 | Disposition: A | Discharge status: Home / Self Care | Payer: Medicaid Other | Source: Ambulatory Visit | Attending: Orthopedic Surgery | Admitting: Orthopedic Surgery

## 2020-12-12 DIAGNOSIS — Z20822 Contact with and (suspected) exposure to covid-19: Secondary | ICD-10-CM | POA: Diagnosis not present

## 2020-12-12 DIAGNOSIS — Z01812 Encounter for preprocedural laboratory examination: Secondary | ICD-10-CM | POA: Diagnosis not present

## 2020-12-12 LAB — SARS CORONAVIRUS 2 (TAT 6-24 HRS): SARS Coronavirus 2: NEGATIVE

## 2020-12-14 ENCOUNTER — Other Ambulatory Visit: Payer: Self-pay

## 2020-12-14 ENCOUNTER — Encounter (HOSPITAL_COMMUNITY)
Admission: RE | Disposition: A | Discharge status: Home / Self Care | Payer: Self-pay | Source: Home / Self Care | Attending: Orthopedic Surgery

## 2020-12-14 ENCOUNTER — Ambulatory Visit (HOSPITAL_COMMUNITY): Payer: Medicaid Other | Admitting: Certified Registered Nurse Anesthetist

## 2020-12-14 ENCOUNTER — Ambulatory Visit (HOSPITAL_COMMUNITY)
Admission: RE | Admit: 2020-12-14 | Discharge: 2020-12-15 | Disposition: A | Discharge status: Home / Self Care | Payer: Medicaid Other | Attending: Orthopedic Surgery | Admitting: Orthopedic Surgery

## 2020-12-14 ENCOUNTER — Encounter (HOSPITAL_COMMUNITY): Payer: Self-pay | Admitting: Orthopedic Surgery

## 2020-12-14 DIAGNOSIS — Z96611 Presence of right artificial shoulder joint: Secondary | ICD-10-CM | POA: Diagnosis present

## 2020-12-14 DIAGNOSIS — F17210 Nicotine dependence, cigarettes, uncomplicated: Secondary | ICD-10-CM | POA: Diagnosis not present

## 2020-12-14 DIAGNOSIS — Z79899 Other long term (current) drug therapy: Secondary | ICD-10-CM | POA: Insufficient documentation

## 2020-12-14 DIAGNOSIS — Z791 Long term (current) use of non-steroidal anti-inflammatories (NSAID): Secondary | ICD-10-CM | POA: Diagnosis not present

## 2020-12-14 DIAGNOSIS — M19011 Primary osteoarthritis, right shoulder: Secondary | ICD-10-CM | POA: Diagnosis not present

## 2020-12-14 DIAGNOSIS — M25711 Osteophyte, right shoulder: Secondary | ICD-10-CM | POA: Diagnosis not present

## 2020-12-14 HISTORY — PX: TOTAL SHOULDER ARTHROPLASTY: SHX126

## 2020-12-14 SURGERY — ARTHROPLASTY, SHOULDER, TOTAL
Anesthesia: Regional | Site: Shoulder | Laterality: Right

## 2020-12-14 MED ORDER — KETOROLAC TROMETHAMINE 15 MG/ML IJ SOLN
15.0000 mg | Freq: Four times a day (QID) | INTRAMUSCULAR | Status: DC
  Administered 2020-12-15 (×2): 15 mg via INTRAVENOUS
  Filled 2020-12-14 (×3): qty 1

## 2020-12-14 MED ORDER — TRANEXAMIC ACID-NACL 1000-0.7 MG/100ML-% IV SOLN
1000.0000 mg | INTRAVENOUS | Status: AC
  Administered 2020-12-14: 1000 mg via INTRAVENOUS
  Filled 2020-12-14: qty 100

## 2020-12-14 MED ORDER — VANCOMYCIN HCL 1000 MG IV SOLR
INTRAVENOUS | Status: DC | PRN
Start: 2020-12-14 — End: 2020-12-14
  Administered 2020-12-14: 1000 mg via TOPICAL

## 2020-12-14 MED ORDER — BUPIVACAINE HCL (PF) 0.5 % IJ SOLN
INTRAMUSCULAR | Status: DC | PRN
  Administered 2020-12-14: 15 mL via PERINEURAL

## 2020-12-14 MED ORDER — LACTATED RINGERS IV SOLN: INTRAVENOUS | Status: DC

## 2020-12-14 MED ORDER — LIDOCAINE 2% (20 MG/ML) 5 ML SYRINGE
INTRAMUSCULAR | Status: DC | PRN
  Administered 2020-12-14: 60 mg via INTRAVENOUS
  Administered 2020-12-14: 40 mg via INTRAVENOUS

## 2020-12-14 MED ORDER — POLYETHYLENE GLYCOL 3350 17 G PO PACK: 17.0000 g | PACK | Freq: Every day | ORAL | Status: DC | PRN

## 2020-12-14 MED ORDER — OXYCODONE HCL 5 MG PO TABS: 5.0000 mg | ORAL_TABLET | ORAL | Status: DC | PRN

## 2020-12-14 MED ORDER — PHENOL 1.4 % MT LIQD: 1.0000 | OROMUCOSAL | Status: DC | PRN

## 2020-12-14 MED ORDER — ROCURONIUM BROMIDE 10 MG/ML (PF) SYRINGE
PREFILLED_SYRINGE | INTRAVENOUS | Status: AC
  Filled 2020-12-14: qty 10

## 2020-12-14 MED ORDER — FLEET ENEMA 7-19 GM/118ML RE ENEM: 1.0000 | ENEMA | Freq: Once | RECTAL | Status: DC | PRN

## 2020-12-14 MED ORDER — DOCUSATE SODIUM 100 MG PO CAPS
100.0000 mg | ORAL_CAPSULE | Freq: Two times a day (BID) | ORAL | Status: DC
  Administered 2020-12-14 – 2020-12-15 (×2): 100 mg via ORAL
  Filled 2020-12-14 (×2): qty 1

## 2020-12-14 MED ORDER — METHOCARBAMOL 500 MG IVPB - SIMPLE MED
500.0000 mg | Freq: Four times a day (QID) | INTRAVENOUS | Status: DC | PRN
  Filled 2020-12-14: qty 50

## 2020-12-14 MED ORDER — ONDANSETRON HCL 4 MG/2ML IJ SOLN
4.0000 mg | Freq: Four times a day (QID) | INTRAMUSCULAR | Status: DC | PRN
  Administered 2020-12-15: 4 mg via INTRAVENOUS
  Filled 2020-12-14: qty 2

## 2020-12-14 MED ORDER — FENTANYL CITRATE (PF) 100 MCG/2ML IJ SOLN
INTRAMUSCULAR | Status: AC
  Filled 2020-12-14: qty 2

## 2020-12-14 MED ORDER — ALUM & MAG HYDROXIDE-SIMETH 200-200-20 MG/5ML PO SUSP: 30.0000 mL | ORAL | Status: DC | PRN

## 2020-12-14 MED ORDER — ONDANSETRON HCL 4 MG PO TABS: 4.0000 mg | ORAL_TABLET | Freq: Three times a day (TID) | ORAL | 0 refills | Status: DC | PRN

## 2020-12-14 MED ORDER — METOCLOPRAMIDE HCL 5 MG PO TABS
5.0000 mg | ORAL_TABLET | Freq: Three times a day (TID) | ORAL | Status: DC | PRN
Start: 2020-12-14 — End: 2020-12-15

## 2020-12-14 MED ORDER — PROMETHAZINE HCL 25 MG/ML IJ SOLN: 6.2500 mg | INTRAMUSCULAR | Status: DC | PRN

## 2020-12-14 MED ORDER — PROPOFOL 10 MG/ML IV BOLUS
INTRAVENOUS | Status: AC
  Filled 2020-12-14: qty 20

## 2020-12-14 MED ORDER — SCOPOLAMINE 1 MG/3DAYS TD PT72
1.0000 | MEDICATED_PATCH | TRANSDERMAL | Status: DC
  Administered 2020-12-14: 1.5 mg via TRANSDERMAL

## 2020-12-14 MED ORDER — METOCLOPRAMIDE HCL 5 MG/ML IJ SOLN: 5.0000 mg | Freq: Three times a day (TID) | INTRAMUSCULAR | Status: DC | PRN

## 2020-12-14 MED ORDER — SUGAMMADEX SODIUM 200 MG/2ML IV SOLN
INTRAVENOUS | Status: DC | PRN
  Administered 2020-12-14: 200 mg via INTRAVENOUS

## 2020-12-14 MED ORDER — MIDAZOLAM HCL 2 MG/2ML IJ SOLN
1.0000 mg | INTRAMUSCULAR | Status: DC
  Filled 2020-12-14: qty 2

## 2020-12-14 MED ORDER — PANTOPRAZOLE SODIUM 40 MG PO TBEC
40.0000 mg | DELAYED_RELEASE_TABLET | Freq: Every day | ORAL | Status: DC
  Administered 2020-12-14 – 2020-12-15 (×2): 40 mg via ORAL
  Filled 2020-12-14 (×2): qty 1

## 2020-12-14 MED ORDER — PROPOFOL 10 MG/ML IV BOLUS
INTRAVENOUS | Status: DC | PRN
  Administered 2020-12-14: 30 mg via INTRAVENOUS
  Administered 2020-12-14: 150 mg via INTRAVENOUS
  Administered 2020-12-14: 20 mg via INTRAVENOUS

## 2020-12-14 MED ORDER — HYDROMORPHONE HCL 2 MG PO TABS: 2.0000 mg | ORAL_TABLET | ORAL | 0 refills | Status: DC | PRN

## 2020-12-14 MED ORDER — PHENYLEPHRINE HCL-NACL 10-0.9 MG/250ML-% IV SOLN
INTRAVENOUS | Status: DC | PRN
  Administered 2020-12-14: 60 ug/min via INTRAVENOUS

## 2020-12-14 MED ORDER — MENTHOL 3 MG MT LOZG: 1.0000 | LOZENGE | OROMUCOSAL | Status: DC | PRN

## 2020-12-14 MED ORDER — CHLORHEXIDINE GLUCONATE 0.12 % MT SOLN
15.0000 mL | Freq: Once | OROMUCOSAL | Status: AC
  Administered 2020-12-14: 15 mL via OROMUCOSAL

## 2020-12-14 MED ORDER — 0.9 % SODIUM CHLORIDE (POUR BTL) OPTIME
TOPICAL | Status: DC | PRN
  Administered 2020-12-14: 1000 mL

## 2020-12-14 MED ORDER — OXYCODONE HCL 5 MG PO TABS
10.0000 mg | ORAL_TABLET | ORAL | Status: DC | PRN
Start: 2020-12-14 — End: 2020-12-15
  Administered 2020-12-14 – 2020-12-15 (×3): 15 mg via ORAL
  Filled 2020-12-14 (×3): qty 3

## 2020-12-14 MED ORDER — LIDOCAINE 2% (20 MG/ML) 5 ML SYRINGE
INTRAMUSCULAR | Status: AC
  Filled 2020-12-14: qty 5

## 2020-12-14 MED ORDER — VANCOMYCIN HCL 1000 MG IV SOLR
INTRAVENOUS | Status: AC
  Filled 2020-12-14: qty 1000

## 2020-12-14 MED ORDER — ACETAMINOPHEN 10 MG/ML IV SOLN: 1000.0000 mg | Freq: Once | INTRAVENOUS | Status: DC | PRN

## 2020-12-14 MED ORDER — ACETAMINOPHEN 325 MG PO TABS
325.0000 mg | ORAL_TABLET | Freq: Four times a day (QID) | ORAL | Status: DC | PRN
  Administered 2020-12-15: 650 mg via ORAL
  Filled 2020-12-14: qty 2

## 2020-12-14 MED ORDER — CYCLOBENZAPRINE HCL 10 MG PO TABS: 10.0000 mg | ORAL_TABLET | Freq: Three times a day (TID) | ORAL | 1 refills | Status: DC | PRN

## 2020-12-14 MED ORDER — CEFAZOLIN SODIUM-DEXTROSE 2-4 GM/100ML-% IV SOLN
2.0000 g | INTRAVENOUS | Status: AC
  Administered 2020-12-14: 2 g via INTRAVENOUS
  Filled 2020-12-14: qty 100

## 2020-12-14 MED ORDER — DIPHENHYDRAMINE HCL 12.5 MG/5ML PO ELIX: 12.5000 mg | ORAL_SOLUTION | ORAL | Status: DC | PRN

## 2020-12-14 MED ORDER — FENTANYL CITRATE (PF) 100 MCG/2ML IJ SOLN
50.0000 ug | INTRAMUSCULAR | Status: DC
Start: 2020-12-14 — End: 2020-12-14
  Administered 2020-12-14: 50 ug via INTRAVENOUS
  Filled 2020-12-14: qty 2

## 2020-12-14 MED ORDER — TRANEXAMIC ACID 1000 MG/10ML IV SOLN: 1000.0000 mg | INTRAVENOUS | Status: DC

## 2020-12-14 MED ORDER — METHOCARBAMOL 500 MG PO TABS
500.0000 mg | ORAL_TABLET | Freq: Four times a day (QID) | ORAL | Status: DC | PRN
  Administered 2020-12-14 – 2020-12-15 (×2): 500 mg via ORAL
  Filled 2020-12-14 (×2): qty 1

## 2020-12-14 MED ORDER — AMISULPRIDE (ANTIEMETIC) 5 MG/2ML IV SOLN: 10.0000 mg | Freq: Once | INTRAVENOUS | Status: DC | PRN

## 2020-12-14 MED ORDER — ONDANSETRON HCL 4 MG/2ML IJ SOLN
INTRAMUSCULAR | Status: DC | PRN
  Administered 2020-12-14: 4 mg via INTRAVENOUS

## 2020-12-14 MED ORDER — SCOPOLAMINE 1 MG/3DAYS TD PT72
MEDICATED_PATCH | TRANSDERMAL | Status: AC
  Filled 2020-12-14: qty 1

## 2020-12-14 MED ORDER — BUPIVACAINE LIPOSOME 1.3 % IJ SUSP
INTRAMUSCULAR | Status: DC | PRN
  Administered 2020-12-14: 10 mL via PERINEURAL

## 2020-12-14 MED ORDER — FENTANYL CITRATE (PF) 100 MCG/2ML IJ SOLN: 25.0000 ug | INTRAMUSCULAR | Status: DC | PRN

## 2020-12-14 MED ORDER — ROCURONIUM BROMIDE 10 MG/ML (PF) SYRINGE
PREFILLED_SYRINGE | INTRAVENOUS | Status: DC | PRN
  Administered 2020-12-14: 20 mg via INTRAVENOUS
  Administered 2020-12-14: 30 mg via INTRAVENOUS

## 2020-12-14 MED ORDER — DEXAMETHASONE SODIUM PHOSPHATE 10 MG/ML IJ SOLN
INTRAMUSCULAR | Status: DC | PRN
  Administered 2020-12-14: 4 mg via INTRAVENOUS

## 2020-12-14 MED ORDER — PHENYLEPHRINE 40 MCG/ML (10ML) SYRINGE FOR IV PUSH (FOR BLOOD PRESSURE SUPPORT)
PREFILLED_SYRINGE | INTRAVENOUS | Status: DC | PRN
  Administered 2020-12-14: 120 ug via INTRAVENOUS
  Administered 2020-12-14: 80 ug via INTRAVENOUS
  Administered 2020-12-14: 120 ug via INTRAVENOUS

## 2020-12-14 MED ORDER — EPHEDRINE SULFATE-NACL 50-0.9 MG/10ML-% IV SOSY
PREFILLED_SYRINGE | INTRAVENOUS | Status: DC | PRN
  Administered 2020-12-14: 5 mg via INTRAVENOUS

## 2020-12-14 MED ORDER — HYDROMORPHONE HCL 1 MG/ML IJ SOLN
0.5000 mg | INTRAMUSCULAR | Status: DC | PRN
  Administered 2020-12-14: 1 mg via INTRAVENOUS
  Filled 2020-12-14: qty 1

## 2020-12-14 MED ORDER — ORAL CARE MOUTH RINSE: 15.0000 mL | Freq: Once | OROMUCOSAL | Status: AC

## 2020-12-14 MED ORDER — BISACODYL 5 MG PO TBEC: 5.0000 mg | DELAYED_RELEASE_TABLET | Freq: Every day | ORAL | Status: DC | PRN

## 2020-12-14 MED ORDER — ONDANSETRON HCL 4 MG PO TABS: 4.0000 mg | ORAL_TABLET | Freq: Four times a day (QID) | ORAL | Status: DC | PRN

## 2020-12-14 SURGICAL SUPPLY — 64 items
BAG ZIPLOCK 12X15 (MISCELLANEOUS) ×2 IMPLANT
BIT DRILL 2.0X128 (BIT) ×2 IMPLANT
BLADE SAW SGTL 83.5X18.5 (BLADE) ×2 IMPLANT
BODY TRUNION ECLIPSE 39 SL (Shoulder) ×2 IMPLANT
CEMENT BONE DEPUY (Cement) ×2 IMPLANT
COOLER ICEMAN CLASSIC (MISCELLANEOUS) IMPLANT
COVER BACK TABLE 60X90IN (DRAPES) ×2 IMPLANT
COVER SURGICAL LIGHT HANDLE (MISCELLANEOUS) ×2 IMPLANT
COVER WAND RF STERILE (DRAPES) IMPLANT
DERMABOND ADVANCED (GAUZE/BANDAGES/DRESSINGS) ×1
DERMABOND ADVANCED .7 DNX12 (GAUZE/BANDAGES/DRESSINGS) ×1 IMPLANT
DRAPE ORTHO SPLIT 77X108 STRL (DRAPES) ×2
DRAPE SHEET LG 3/4 BI-LAMINATE (DRAPES) ×2 IMPLANT
DRAPE SURG 17X11 SM STRL (DRAPES) ×2 IMPLANT
DRAPE SURG ORHT 6 SPLT 77X108 (DRAPES) ×2 IMPLANT
DRAPE U-SHAPE 47X51 STRL (DRAPES) ×2 IMPLANT
DRSG AQUACEL AG ADV 3.5X 6 (GAUZE/BANDAGES/DRESSINGS) ×2 IMPLANT
DRSG AQUACEL AG ADV 3.5X10 (GAUZE/BANDAGES/DRESSINGS) ×2 IMPLANT
DURAPREP 26ML APPLICATOR (WOUND CARE) ×2 IMPLANT
ELECT BLADE TIP CTD 4 INCH (ELECTRODE) ×2 IMPLANT
ELECT REM PT RETURN 15FT ADLT (MISCELLANEOUS) ×2 IMPLANT
FACESHIELD WRAPAROUND (MASK) ×8 IMPLANT
GLENOID WITH CLEAT SM (Miscellaneous) ×2 IMPLANT
GLOVE SRG 8 PF TXTR STRL LF DI (GLOVE) ×1 IMPLANT
GLOVE SURG ENC MOIS LTX SZ7 (GLOVE) ×2 IMPLANT
GLOVE SURG ENC MOIS LTX SZ7.5 (GLOVE) ×2 IMPLANT
GLOVE SURG ENC MOIS LTX SZ8 (GLOVE) ×2 IMPLANT
GLOVE SURG UNDER POLY LF SZ7 (GLOVE) ×2 IMPLANT
GLOVE SURG UNDER POLY LF SZ8 (GLOVE) ×1
GOWN STRL REUS W/TWL LRG LVL3 (GOWN DISPOSABLE) ×4 IMPLANT
HEAD HUMERAL ECLIPSE 39/16 (Shoulder) ×2 IMPLANT
IMPL ECLIPSE SPEEDCAP (Shoulder) ×1 IMPLANT
IMPLANT ECLIPSE SPEEDCAP (Shoulder) ×2 IMPLANT
KIT BASIN OR (CUSTOM PROCEDURE TRAY) ×2 IMPLANT
KIT TURNOVER KIT A (KITS) ×2 IMPLANT
MANIFOLD NEPTUNE II (INSTRUMENTS) ×2 IMPLANT
NEEDLE TAPERED W/ NITINOL LOOP (MISCELLANEOUS) IMPLANT
NS IRRIG 1000ML POUR BTL (IV SOLUTION) ×2 IMPLANT
PACK SHOULDER (CUSTOM PROCEDURE TRAY) ×2 IMPLANT
PAD COLD SHLDR WRAP-ON (PAD) IMPLANT
PIN NITINOL TARGETER 2.8 (PIN) IMPLANT
PIN SET MODULAR GLENOID SYSTEM (PIN) ×2 IMPLANT
PROTECTOR NERVE ULNAR (MISCELLANEOUS) ×2 IMPLANT
RESTRAINT HEAD UNIVERSAL NS (MISCELLANEOUS) ×2 IMPLANT
SCREW MED ECLIPSE 35 (Screw) ×2 IMPLANT
SIZER ECLIPSE CAGE SCREW (ORTHOPEDIC DISPOSABLE SUPPLIES) ×2 IMPLANT
SLING ARM FOAM STRAP LRG (SOFTGOODS) IMPLANT
SLING ARM FOAM STRAP MED (SOFTGOODS) ×2 IMPLANT
SMARTMIX MINI TOWER (MISCELLANEOUS)
SPONGE LAP 18X18 RF (DISPOSABLE) IMPLANT
SUCTION FRAZIER HANDLE 12FR (TUBING) ×1
SUCTION TUBE FRAZIER 12FR DISP (TUBING) ×1 IMPLANT
SUT FIBERWIRE #2 38 T-5 BLUE (SUTURE)
SUT MNCRL AB 3-0 PS2 18 (SUTURE) ×2 IMPLANT
SUT MON AB 2-0 CT1 36 (SUTURE) ×2 IMPLANT
SUT VIC AB 1 CT1 36 (SUTURE) ×2 IMPLANT
SUTURE FIBERWR #2 38 T-5 BLUE (SUTURE) IMPLANT
SUTURE TAPE 1.3 40 TPR END (SUTURE) IMPLANT
SUTURETAPE 1.3 40 TPR END (SUTURE)
TOWEL OR 17X26 10 PK STRL BLUE (TOWEL DISPOSABLE) ×2 IMPLANT
TOWEL OR NON WOVEN STRL DISP B (DISPOSABLE) ×2 IMPLANT
TOWER SMARTMIX MINI (MISCELLANEOUS) IMPLANT
WATER STERILE IRR 1000ML POUR (IV SOLUTION) ×4 IMPLANT
YANKAUER SUCT BULB TIP 10FT TU (MISCELLANEOUS) ×2 IMPLANT

## 2020-12-14 NOTE — Anesthesia Procedure Notes (Signed)
Anesthesia Regional Block: Interscalene brachial plexus block   Pre-Anesthetic Checklist: , timeout performed,  Correct Patient, Correct Site, Correct Laterality,  Correct Procedure, Correct Position, site marked,  Risks and benefits discussed,  Surgical consent,  Pre-op evaluation,  At surgeon's request and post-op pain management  Laterality: Right  Prep: chloraprep       Needles:  Injection technique: Single-shot  Needle Type: Echogenic Stimulator Needle     Needle Length: 9cm  Needle Gauge: 21     Additional Needles:   Procedures:,,,, ultrasound used (permanent image in chart),,    Narrative:  Start time: 12/14/2020 9:05 AM End time: 12/14/2020 9:15 AM Injection made incrementally with aspirations every 5 mL.  Performed by: Personally  Anesthesiologist: Murvin Natal, MD  Additional Notes: Functioning IV was confirmed and monitors were applied.  A timeout was performed. Sterile prep, hand hygiene and sterile gloves were used. A 74mm 21ga Arrow echogenic stimulator needle was used. Negative aspiration and negative test dose prior to incremental administration of local anesthetic. The patient tolerated the procedure well.  Ultrasound guidance: relevent anatomy identified, needle position confirmed, local anesthetic spread visualized around nerve(s), vascular puncture avoided.  Image printed for medical record.

## 2020-12-14 NOTE — Discharge Instructions (Signed)
 Kevin M. Supple, M.D., F.A.A.O.S. Orthopaedic Surgery Specializing in Arthroscopic and Reconstructive Surgery of the Shoulder [phone] [address] - [place], [place] [address] - Fax [phone]   POST-OP TOTAL SHOULDER REPLACEMENT INSTRUCTIONS  1. Follow up in the office for your first post-op appointment 10-14 days from the date of your surgery. If you do not already have a scheduled appointment, our office will contact you to schedule.  2. The bandage over your incision is waterproof. You may begin showering with this dressing on. You may leave this dressing on until first follow up appointment within 2 weeks. We prefer you leave this dressing in place until follow up however after 5-7 days if you are having itching or skin irritation and would like to remove it you may do so. Go slow and tug at the borders gently to break the bond the dressing has with the skin. At this point if there is no drainage it is okay to go without a bandage or you may cover it with a light guaze and tape. You can also expect significant bruising around your shoulder that will drift down your arm and into your chest wall. This is very normal and should resolve over several days.   3. Wear your sling/immobilizer at all times except to perform the exercises below or to occasionally let your arm dangle by your side to stretch your elbow. You also need to sleep in your sling immobilizer until instructed otherwise. It is ok to remove your sling if you are sitting in a controlled environment and allow your arm to rest in a position of comfort by your side or on your lap with pillows to give your neck and skin a break from the sling. You may remove it to allow arm to dangle by side to shower. If you are up walking around and when you go to sleep at night you need to wear it.  4. Range of motion to your elbow, wrist, and hand are encouraged 3-5 times daily. Exercise to your hand and fingers helps to reduce  swelling you may experience.   5. Prescriptions for a pain medication and a muscle relaxant are provided for you. It is recommended that if you are experiencing pain that you pain medication alone is not controlling, add the muscle relaxant along with the pain medication which can give additional pain relief. The first 1-2 days is generally the most severe of your pain and then should gradually decrease. As your pain lessens it is recommended that you decrease your use of the pain medications to an "as needed basis'" only and to always comply with the recommended dosages of the pain medications.  6. Pain medications can produce constipation along with their use. If you experience this, the use of an over the counter stool softener or laxative daily is recommended.   7. For additional questions or concerns, please do not hesitate to call the office. If after hours there is an answering service to forward your concerns to the physician on call.  8.Pain control following an exparel block  To help control your post-operative pain you received a nerve block  performed with Exparel which is a long acting anesthetic (numbing agent) which can provide pain relief and sensations of numbness (and relief of pain) in the operative shoulder and arm for up to 3 days. Sometimes it provides mixed relief, meaning you may still have numbness in certain areas of the arm but can still be able to   move  parts of that arm, hand, and fingers. We recommend that your prescribed pain medications  be used as needed. We do not feel it is necessary to "pre medicate" and "stay ahead" of pain.  Taking narcotic pain medications when you are not having any pain can lead to unnecessary and potentially dangerous side effects.    9. Use the ice machine as much as possible in the first 5-7 days from surgery, then you can wean its use to as needed. The ice typically needs to be replaced every 6 hours, instead of ice you can actually freeze  water bottles to put in the cooler and then fill water around them to avoid having to purchase ice. You can have spare water bottles freezing to allow you to rotate them once they have melted. Try to have a thin shirt or light cloth or towel under the ice wrap to protect your skin.   FOR ADDITIONAL INFO ON ICE MACHINE AND INSTRUCTIONS GO TO THE WEBSITE AT  https://www.djoglobal.com/products/donjoy/donjoy-iceman-[email or web address]  10.  We recommend that you avoid any dental work or cleaning in the first 3 months following your joint replacement. This is to help minimize the possibility of infection from the bacteria in your mouth that enters your bloodstream during dental work. We also recommend that you take an antibiotic prior to your dental work for the first year after your shoulder replacement to further help reduce that risk. Please simply contact our office for antibiotics to be sent to your pharmacy prior to dental work.  11. Dental Antibiotics:  In most cases prophylactic antibiotics for Dental procdeures after total joint surgery are not necessary.  Exceptions are as follows:  1. History of prior total joint infection  2. Severely immunocompromised (Organ Transplant, cancer chemotherapy, Rheumatoid biologic meds such as Humera)  3. Poorly controlled diabetes (A1C &gt; 8.0, blood glucose over 200)  If you have one of these conditions, contact your surgeon for an antibiotic prescription, prior to your dental procedure.   POST-OP EXERCISES  Pendulum Exercises  Perform pendulum exercises while standing and bending at the waist. Support your uninvolved arm on a table or chair and allow your operated arm to hang freely. Make sure to do these exercises passively - not using you shoulder muscles. These exercises can be performed once your nerve block effects have worn off.  Repeat 20 times. Do 3 sessions per day.     

## 2020-12-14 NOTE — Anesthesia Postprocedure Evaluation (Signed)
Anesthesia Post Note  Patient: Alicia Tyler  Procedure(s) Performed: TOTAL SHOULDER ARTHROPLASTY (Right: Shoulder)     Patient location during evaluation: PACU Anesthesia Type: Regional and General Level of consciousness: awake Pain management: pain level controlled Vital Signs Assessment: post-procedure vital signs reviewed and stable Respiratory status: spontaneous breathing, nonlabored ventilation, respiratory function stable and patient connected to nasal cannula oxygen Cardiovascular status: blood pressure returned to baseline and stable Postop Assessment: no apparent nausea or vomiting Anesthetic complications: no   No notable events documented.  Last Vitals:  Vitals:   12/14/20 1300 12/14/20 1400  BP: 116/73 119/74  Pulse: 64 66  Resp: 17 19  Temp:  36.6 C  SpO2: 98% 98%    Last Pain:  Vitals:   12/14/20 1501  TempSrc:   PainSc: 0-No pain                 Alcide Memoli P Kinzi Frediani

## 2020-12-14 NOTE — H&P (Signed)
Production designer, theatre/television/film Complaint: Right shoulder osteoarthritis HPI: The patient is a 50 y.o. female with chronic and progressively increasing right shoulder pain related to severe osteoarthritis.  Due to her increasing functional limitations and failure to respond to prolonged attempts at conservative management, she is brought to the operating room at this time for planned right shoulder anatomic arthroplasty  Past Medical History:  Diagnosis Date   Anxiety    BMI (body mass index) 20.0-29.01 Apr 2010 172 LBS   Chronic back pain    Degenerative disc disease, thoracic    Gastroparesis    GERD (gastroesophageal reflux disease)    Headache(784.0)    IBS (irritable bowel syndrome)    Incontinence of feces    WEARS DEPENDS AT HS     Neuropathy    PONV (postoperative nausea and vomiting)    Pseudoobstruction of colon 08/24/2014   Seasonal allergies    Serrated adenoma of colon 05/2010    Past Surgical History:  Procedure Laterality Date   ABLATION     endometrial ablation   APPENDECTOMY     BACK SURGERY  05/14/2011   Lumbar Fusion and cage   BIOPSY  06/22/2015   Procedure: BIOPSY;  Surgeon: Danie Binder, MD;  Location: [facility];  Service: Endoscopy;;  gastric and esophageal bx's   BREAST LUMPECTOMY     from the left breast 2003,  2016   CHOLECYSTECTOMY     COLECTOMY N/A 08/24/2014   Procedure: TOTAL COLECTOMY;  Surgeon: Jamesetta So, MD;  Location: [facility];  Service: General;  Laterality: N/A;   COLON SURGERY     COLONOSCOPY  NOV 2011 SCREENING    HYPERPLASTIC RECTAL POLYP, SML IH, incomplete due to discomfort   COLONOSCOPY  DEC 2011 w/ PROPOFOL   SERRATED ADENOMA, HYPERPLASTIC POLY[S[   ESOPHAGEAL DILATION     ESOPHAGEAL MANOMETRY N/A 12/29/2017   Procedure: ESOPHAGEAL MANOMETRY (EM);  Surgeon: Mauri Pole, MD;  Location: [facility];  Service: Endoscopy;  Laterality: N/A;   ESOPHAGOGASTRODUODENOSCOPY (EGD) WITH PROPOFOL N/A 06/22/2015   Procedure:  ESOPHAGOGASTRODUODENOSCOPY (EGD) WITH PROPOFOL;  Surgeon: Danie Binder, MD;  [facility]: [facility];  Service: Endoscopy;  Laterality: N/A;   ESOPHAGOGASTRODUODENOSCOPY (EGD) WITH PROPOFOL N/A 01/21/2017   Procedure: ESOPHAGOGASTRODUODENOSCOPY (EGD) WITH PROPOFOL;  Surgeon: Danie Binder, MD;  [facility]: [facility];  Service: Endoscopy;  Laterality: N/A;  9:30am   FLEXIBLE SIGMOIDOSCOPY N/A 06/22/2015   Procedure: FLEXIBLE SIGMOIDOSCOPY WITH PROPOFOL;  Surgeon: Danie Binder, MD;  [facility]: [facility];  Service: Endoscopy;  Laterality: N/A;  0830    FLEXIBLE SIGMOIDOSCOPY N/A 11/24/2020   Procedure: FLEXIBLE SIGMOIDOSCOPY;  Surgeon: Eloise Harman, DO;  Location: [facility];  Service: Endoscopy;  Laterality: N/A;  10:30am   HARDWARE REMOVAL N/A 09/03/2016   Procedure: Removal of Lumbar five-Sacrum one  hardware with Metrex;  Surgeon: Kristeen Miss, MD;  Location: [facility];  Service: Neurosurgery;  Laterality: N/A;   KNEE SURGERY Bilateral    2 on righ-1 scope and one open; and one on left   MOUTH SURGERY  07/21/2013   open colectomy      OVARIAN CYST REMOVAL     POLYPECTOMY  11/24/2020   Procedure: POLYPECTOMY;  Surgeon: Eloise Harman, DO;  [facility]: [facility];  Service: Endoscopy;;   SAVORY DILATION N/A 01/21/2017   Procedure: SAVORY DILATION;  Surgeon: Danie Binder, MD;  [facility]: [facility];  Service: Endoscopy;  Laterality: N/A;  SMART PILL PROCEDURE N/A 07/04/2014   Procedure: SMART PILL PROCEDURE;  Surgeon: Danie Binder, MD;  Location: [facility];  Service: Endoscopy;  Laterality: N/A;  800   TUBAL LIGATION      Family History  Problem Relation Age of Onset   Breast cancer Maternal Aunt    Colon cancer Neg Hx    Colon polyps Neg Hx     Social History:  reports that she has been smoking cigarettes. She has a 55.50 pack-year smoking history. She has never used smokeless tobacco. She reports current alcohol use. She reports that she does not use  drugs.   Medications Prior to Admission  Medication Sig Dispense Refill   Ascorbic Acid (VITAMIN C) 1000 MG tablet Take 1,000 mg by mouth in the morning and at bedtime.     Cholecalciferol (VITAMIN D3) 50 MCG (2000 UT) TABS Take 2,000 Units by mouth daily.     cyanocobalamin (,VITAMIN B-12,) 1000 MCG/ML injection Inject 1,000 mcg into the muscle every 30 (thirty) days.     escitalopram (LEXAPRO) 20 MG tablet Take 20 mg by mouth daily.     furosemide (LASIX) 20 MG tablet Take 20 mg by mouth 2 (two) times a week. Mondays and Fridays     gabapentin (NEURONTIN) 600 MG tablet Take 600 mg by mouth 3 (three) times daily.      LINZESS 290 MCG CAPS capsule TAKE ONE CAPSULE BY MOUTH EVERY DAY 30 minutes BEFORE BREAKFAST (Patient taking differently: Take 290 mcg by mouth daily before breakfast. 31mins before breakfast) 90 capsule 3   loratadine (CLARITIN) 10 MG tablet Take 10 mg by mouth daily.     meloxicam (MOBIC) 15 MG tablet Take 15 mg by mouth daily.     methocarbamol (ROBAXIN) 750 MG tablet Take 750 mg by mouth in the morning and at bedtime.     omeprazole (PRILOSEC) 20 MG capsule TAKE ONE CAPSULE BY MOUTH ONE OR TWO TIMES DAILY 30 MINUTES BEFORE A MEAL (Patient taking differently: Take 20 mg by mouth 2 (two) times daily before a meal.) 60 capsule 11   ondansetron (ZOFRAN-ODT) 4 MG disintegrating tablet Take 4 mg by mouth every 8 (eight) hours as needed for nausea or vomiting.     Oxycodone HCl 10 MG TABS Take 10 mg by mouth in the morning, at noon, and at bedtime.     rosuvastatin (CRESTOR) 5 MG tablet Take 5 mg by mouth daily.     SUMAtriptan (IMITREX) 100 MG tablet Take 100 mg by mouth every 2 (two) hours as needed for migraine or headache. May repeat in 2 hours if headache persists or recurs.     Tetrahydroz-Dextran-PEG-Povid (VISINE ADVANCED RELIEF) 0.05-0.1-1-1 % SOLN Place 2 drops into both eyes 2 (two) times daily.     topiramate (TOPAMAX) 50 MG tablet Take 50 mg by mouth 2 (two) times daily.      Zinc Sulfate 220 (50 Zn) MG TABS Take 220 mg by mouth in the morning.     loperamide (IMODIUM A-D) 2 MG tablet Take 1 tablet (2 mg total) by mouth 4 (four) times daily as needed for diarrhea or loose stools. (Patient not taking: Reported on 12/01/2020) 30 tablet 0     Physical Exam: Right shoulder demonstrates painful and guarded motion as noted at her recent office visits.  She does maintain good strength to manual muscle testing.  Radiographs  Plain films confirm severe osteoarthritis with complete loss of joint space, subchondral sclerosis, and peripheral osteophyte  formation.  Vitals  Temp:  [98.3 F (36.8 C)] 98.3 F (36.8 C) (06/23 0744) Pulse Rate:  [74-78] 78 (06/23 0920) Resp:  [16-18] 18 (06/23 0920) BP: (118-132)/(74-93) 119/76 (06/23 0920) SpO2:  [99 %-100 %] 100 % (06/23 0920) Weight:  [71.7 kg] 71.7 kg (06/23 0744)  Assessment/Plan  Impression: Right shoulder osteoarthritis  Plan of Action: Procedure(s): TOTAL SHOULDER ARTHROPLASTY  Myeasha Ballowe M Shanda Cadotte 12/14/2020, 9:32 AM Contact # [phone]

## 2020-12-14 NOTE — Anesthesia Procedure Notes (Signed)
Procedure Name: Intubation Date/Time: 12/14/2020 10:21 AM Performed by: Milford Cage, CRNA Pre-anesthesia Checklist: Patient identified, Emergency Drugs available, Suction available and Patient being monitored Patient Re-evaluated:Patient Re-evaluated prior to induction Oxygen Delivery Method: Circle System Utilized Preoxygenation: Pre-oxygenation with 100% oxygen Induction Type: IV induction Ventilation: Mask ventilation without difficulty Laryngoscope Size: Miller and 2 Grade View: Grade I Tube type: Oral Tube size: 7.0 mm Number of attempts: 1 Airway Equipment and Method: Stylet Placement Confirmation: ETT inserted through vocal cords under direct vision, positive ETCO2 and breath sounds checked- equal and bilateral Secured at: 21 cm Tube secured with: Tape Dental Injury: Teeth and Oropharynx as per pre-operative assessment

## 2020-12-14 NOTE — Progress Notes (Signed)
Time out completed.  Assisted Dr. Roanna Banning with right, ultrasound guided, interscalene  block. Side rails up, monitors on throughout procedure. See vital signs in flow sheet. Tolerated Procedure well.

## 2020-12-14 NOTE — Anesthesia Preprocedure Evaluation (Addendum)
Anesthesia Evaluation  Patient identified by MRN, date of birth, ID band Patient awake    Reviewed: Allergy & Precautions, NPO status , Patient's Chart, lab work & pertinent test results  History of Anesthesia Complications (+) PONV and history of anesthetic complications  Airway Mallampati: III  TM Distance: >3 FB Neck ROM: Full    Dental no notable dental hx.    Pulmonary Current SmokerPatient did not abstain from smoking.,    Pulmonary exam normal breath sounds clear to auscultation       Cardiovascular negative cardio ROS Normal cardiovascular exam Rhythm:Regular Rate:Normal     Neuro/Psych  Headaches, Anxiety    GI/Hepatic GERD  Medicated and Controlled,(+)     substance abuse  ,   Endo/Other  negative endocrine ROS  Renal/GU negative Renal ROS     Musculoskeletal  (+) Arthritis , narcotic dependent  Abdominal   Peds  Hematology HLD   Anesthesia Other Findings Right shoulder osteoarthritis  Reproductive/Obstetrics                            Anesthesia Physical Anesthesia Plan  ASA: 2  Anesthesia Plan: General and Regional   Post-op Pain Management: GA combined w/ Regional for post-op pain   Induction: Intravenous  PONV Risk Score and Plan: 3 and Ondansetron, Dexamethasone, Midazolam, Scopolamine patch - Pre-op and Treatment may vary due to age or medical condition  Airway Management Planned: Oral ETT  Additional Equipment:   Intra-op Plan:   Post-operative Plan: Extubation in OR  Informed Consent: I have reviewed the patients History and Physical, chart, labs and discussed the procedure including the risks, benefits and alternatives for the proposed anesthesia with the patient or authorized representative who has indicated his/her understanding and acceptance.     Dental advisory given  Plan Discussed with: CRNA  Anesthesia Plan Comments:          Anesthesia Quick Evaluation

## 2020-12-14 NOTE — Transfer of Care (Signed)
Immediate Anesthesia Transfer of Care Note  Patient: Alicia Tyler  Procedure(s) Performed: TOTAL SHOULDER ARTHROPLASTY (Right: Shoulder)  Patient Location: PACU  Anesthesia Type:General  Level of Consciousness: drowsy  Airway & Oxygen Therapy: Patient Spontanous Breathing and Patient connected to face mask oxygen  Post-op Assessment: Report given to RN and Post -op Vital signs reviewed and stable  Post vital signs: Reviewed and stable  Last Vitals:  Vitals Value Taken Time  BP 109/68 12/14/20 1217  Temp    Pulse 82 12/14/20 1220  Resp 22 12/14/20 1220  SpO2 94 % 12/14/20 1220  Vitals shown include unvalidated device data.  Last Pain:  Vitals:   12/14/20 0940  TempSrc:   PainSc: Asleep      Patients Stated Pain Goal: 6 (12/45/80 9983)  Complications: No notable events documented.

## 2020-12-14 NOTE — Plan of Care (Signed)
  Problem: Activity: Goal: Risk for activity intolerance will decrease Outcome: Progressing   Problem: Pain Managment: Goal: General experience of comfort will improve Outcome: Progressing   Problem: Safety: Goal: Ability to remain free from injury will improve Outcome: Progressing   

## 2020-12-14 NOTE — Op Note (Signed)
12/14/2020  12:09 PM  PATIENT:   Alicia Tyler  50 y.o. female  PRE-OPERATIVE DIAGNOSIS:  Right shoulder osteoarthritis  POST-OPERATIVE DIAGNOSIS: Same  PROCEDURE: Right shoulder anatomic stemless arthroplasty utilizing a size 39 trunnion with a medium cage screw, 39/16 humeral head on a small glenoid  SURGEON:  Landis Dowdy, Metta Clines M.D.  ASSISTANTS: Jenetta Loges, PA-C  ANESTHESIA:   General endotracheal and interscalene block with Exparel  EBL: 150 cc  SPECIMEN: None  Drains: None   PATIENT DISPOSITION:  PACU - hemodynamically stable.    PLAN OF CARE: Discharge to home after PACU  Brief history:  Patient is a 50 year old female who has had chronic and progressively increasing right shoulder pain related to severe osteoarthritis.  Due to her increasing functional rotations and failure to respond to prolonged attempts at conservative management, she is brought to the operating this time for planned right shoulder anatomic arthroplasty.  Preoperatively, I counseled the patient regarding treatment options and risks versus benefits thereof.  Possible surgical complications were all reviewed including potential for bleeding, infection, neurovascular injury, persistent pain, loss of motion, anesthetic complication, failure of the implant, and possible need for additional surgery. They understand and accept and agrees with our planned procedure.   Procedure in detail:  After undergoing routine preop evaluation the patient received prophylactic antibiotics and interscalene block with Exparel was established in the holding area by the anesthesia department.  Patient subsequently placed spine on the operating table and underwent the smooth induction of a general endotracheal anesthesia.  Placed into the beachchair position and appropriately padded and protected.  The right shoulder girdle region was sterilely prepped and draped in standard fashion.  Timeout was called.  A deltopectoral  approach the right shoulder was made through an approximately 8 cm incision.  Skin flaps were elevated dissection carried deeply and the deltopectoral interval was developed from proximal to distal with the vein taken laterally.  Upper centimeter the pectoralis major was tenotomized for exposure.  Conjoined tendon mobilized retracted medially.  The long head biceps tendon was tenodesed at the upper border the pectoralis major tendon the proximal segment was unroofed and excised.  The rotator cuff was then split from the apex of the bicipital groove to the base of the coracoid and the subscapularis insertion was carefully identified superiorly and inferiorly and an oscillating saw was then used to perform a lesser tuberosity osteotomy and the subscap was intact and reflected medially and capsular attachments were then divided from the anterior and inferior margins of the humeral neck.  Humeral head was then delivered through the wound and the extra medullary guide was then used to outline the proposed humeral head resection which we performed at approximate 25 degrees retroversion matching the native retroversion and this was accomplished with an oscillating saw taking care to protect the surrounding soft tissues.  Peripheral osteophytes were removed with rongeurs and the humeral metaphysis was sized at a 39.  Metaphyseal preparation was completed with the coring guide in depth gauge used to determine a size medium cage screw.  A metal cap was then placed over the cut proximal humeral surface.  At this point we turned our attention of the glenoid which was exposed with appropriate retractors and a circumferential labral resection was then performed.  A guidepin was then directed into the center of the glenoid and glenoid was then reamed to a stable subchondral bone bed and all residual debris was carefully removed.  Terminal preparation completed with the central drill followed  by the superior and inferior peg and  slot respectively and the glenoid was then broached and trial showed excellent fit and fixation.  At this point cement was mixed and the glenoid was cleaned and dried and cement was introduced in the superior and inferior peg and slot respectively and morselized bone graft was placed around the central peg of our glenoid and the glenoid was then impacted with excellent fit and fixation.  Attention returned back to the proximal humerus where the trunnion guide was placed followed by the trunnion after sutures been placed to the eyelid on the collar of the trunnion.  The trunnion was then seated and are cage screw was then packed with bone graft and cage screw was inserted obtaining excellent purchase and fixation.  A series of trial reduction was then performed and ultimately the 39 x 16 head gave [place] the best soft tissue balance with approximately 50% translation across the glenoid good soft tissue balance good motion.  The trial was then removed.  This point we placed additional medial row anchors x2 for our ultimate LTO repair.  The implant was then cleaned and dried and the final 39 x 16 head was impacted and the final reduction again showed good motion good stability and soft tissue balance.  At this time the 3 medial row suture limbs were then passed up through the bone tendon injection of the lesser tuberosity osteotomy after we confirmed that had good elasticity.  We placed a apex suture at the junction between the upper subscap and the anterior supraspinatus to confirm proper positioning of the LTO in the LTO was then repaired with a double row technique using 2 lateral anchors placed in the bicipital groove and these were achieved excellent purchase and fixation.  The rotator interval was then repaired with a series of figure-of-eight suture tape sutures.  Upon completion the arm easily achieved 45 degrees of external rotation without excessive tension on the subscap repair.  At this point final irrigation  was completed.  Hemostasis was obtained.  1 g of vancomycin powder was then sprayed liberally throughout the deep soft tissue planes.  The deltopectoral interval was reapproximated with a series of figure-of-eight 0 Vicryl sutures.  2-0 Monocryl used to the subcu layer and intracuticular 3-0 Monocryl for the skin followed by Dermabond and Aquacel dressing.  Right arm is placed in a sling and the patient was awakened, extubated, and taken to the recovery room in stable condition.  Jenetta Loges, PA-C was utilized as an Environmental consultant throughout this case, essential for help with positioning the patient, positioning extremity, tissue manipulation, implantation of the prosthesis, suture management, wound closure, and intraoperative decision-making.  Marin Shutter MD   Contact # [phone]

## 2020-12-14 NOTE — Progress Notes (Signed)
Alicia Tyler  MRN: [id number] DOB/Age: 12/02/1970 50 y.o. [facility] Procedure: Procedure(s) (LRB): TOTAL SHOULDER ARTHROPLASTY (Right)     Subjective: Pt seen on post op rounds. Just assisted to bathroom, some burning in axilla and chest wall but otherwise ok  Vital Signs Temp:  [97.9 F (36.6 C)-98.3 F (36.8 C)] 97.9 F (36.6 C) (06/23 1400) Pulse Rate:  [64-79] 66 (06/23 1400) Resp:  [16-19] 19 (06/23 1400) BP: (109-132)/(67-93) 119/74 (06/23 1400) SpO2:  [94 %-100 %] 98 % (06/23 1400) Weight:  [71.7 kg] 71.7 kg (06/23 0744)  Lab Results No results for input(s): WBC, HGB, HCT, PLT in the last 72 hours. BMET No results for input(s): NA, K, CL, CO2, GLUCOSE, BUN, CREATININE, CALCIUM in the last 72 hours. No results found for: INR   Exam Sling on Dressing dry         Plan DC instructions given patient and orders and med rec completed Will keep overnight and after OT sees in am she is ok to DC home in am DC order for am placed  [facility] PA-C  12/14/2020, 3:15 PM Contact # [phone]    Pt seen on2

## 2020-12-15 ENCOUNTER — Encounter (HOSPITAL_COMMUNITY): Payer: Self-pay | Admitting: Orthopedic Surgery

## 2020-12-15 DIAGNOSIS — M19011 Primary osteoarthritis, right shoulder: Secondary | ICD-10-CM | POA: Diagnosis not present

## 2020-12-15 NOTE — Evaluation (Addendum)
Occupational Therapy Evaluation Patient Details Name: Alicia Tyler MRN: [id number] DOB: 1971/05/15 Today's Date: 12/15/2020    History of Present Illness     Clinical Impression   [name] is a 50 year old woman s/p shoulder replacement without functional use of right dominant upper extremity secondary to effects of surgery, interscalene block and shoulder precautions. Patient's upper extremities exhibits mild edema throughout, is warm to touch and shiny. Patient reports pain is 7/10 despite block and premedicated. Patient reports pain in axilla and rib area. Further sensory and pain assessment limited due o effects of interscalene block.Therapist provided education and instruction to patient in regards to exercises, precautions, positioning, donning upper extremity clothing and bathing while maintaining shoulder precautions, ice and edema management using ice cuff and cooler and donning/doffing sling. Patient verbalized understanding and handouts provided to maximize retention of education. Patient needed assistance to donn shirt,  pants, and sling. Discussed compensatory strategies to perform ADLs at home. Patient has CNA to assist 5 days a week as well. RN notified of patient's RUE pain, warmness and shininess. OT education complete - no further OT needs. Patient to follow up with MD for further therapy needs.      Follow Up Recommendations  Follow surgeon's recommendation for DC plan and follow-up therapies    Equipment Recommendations  Tub/shower seat    Recommendations for Other Services       Precautions / Restrictions Precautions Precautions: Shoulder Type of Shoulder Precautions: May come out of sling if sitting in controlled environment. ie while watching tv, eating etc to give neck and skin break from sling. Please sleep in sling though until 4 weeks post op.     Ok to use operative arm to assist in feeding, bathing, ADL's.       New PROM restrictions (8/18) for use in  hygiene and ADL only   ER 20   ABD 45   FE 60     Pendulums are to be gentle and are the preferred exercise to be instructed for patients to perform at home.( Along with elbow wrist and hand exercise) Shoulder Interventions: Shoulder sling/immobilizer;At all times;Off for dressing/bathing/exercises Precaution Booklet Issued:  (handouts) Required Braces or Orthoses: Sling Restrictions Weight Bearing Restrictions: Yes RUE Weight Bearing: Non weight bearing      Mobility Bed Mobility Overal bed mobility: Modified Independent Bed Mobility: Supine to Sit           General bed mobility comments: supervision to transfer to side of bed using bed rail.    Transfers Overall transfer level: Needs assistance               General transfer comment: min guard to ambulate around bed.    Balance Overall balance assessment: Mild deficits observed, not formally tested                                         ADL either performed or assessed with clinical judgement   ADL Overall ADL's : Needs assistance/impaired Eating/Feeding: Set up   Grooming: Modified independent   Upper Body Bathing: Minimal assistance;Sitting   Lower Body Bathing: Moderate assistance;Sit to/from stand   Upper Body Dressing : Moderate assistance;Sitting;Adhering to UE precautions   Lower Body Dressing: Moderate assistance;Sit to/from stand   Toilet Transfer: Min guard;Ambulation;Regular Toilet   Toileting- Clothing Manipulation and Hygiene: Minimal assistance;Sit to/from stand Toileting - Water quality scientist Details (  indicate cue type and reason): min assist for pulling clothing up over right hip - patient has plans to wear nightgowns and doesn't wear underwear.     Functional mobility during ADLs: Min guard       Vision Patient Visual Report: No change from baseline       Perception     Praxis      Pertinent Vitals/Pain Pain Assessment: 0-10 Pain Score: 7  Pain Descriptors  / Indicators: Throbbing;Grimacing;Guarding Pain Intervention(s): Limited activity within patient's tolerance;Premedicated before session;Monitored during session     Hand Dominance Right   Extremity/Trunk Assessment Upper Extremity Assessment Upper Extremity Assessment: RUE deficits/detail RUE Deficits / Details: Unable to fully extend/flex fingers, minimal wrist extension at this time due to effects of block. Patient's RUE mildly swollen from shoulder to finger tips, appears somewhat red though difficult to ascertain due to tan. Arms appears shiny compared to LUE. Arm warmer than left. Sensation/pain testing limited due to effects of block. RUE Sensation: decreased light touch RUE Coordination: decreased fine motor;decreased gross motor   Lower Extremity Assessment Lower Extremity Assessment: Overall WFL for tasks assessed   Cervical / Trunk Assessment Cervical / Trunk Assessment: Normal   Communication Communication Communication: No difficulties   Cognition Arousal/Alertness: Awake/alert Behavior During Therapy: WFL for tasks assessed/performed Overall Cognitive Status: Within Functional Limits for tasks assessed                                     General Comments       Exercises     Shoulder Instructions Shoulder Instructions Donning/doffing shirt without moving shoulder: Patient able to independently direct caregiver Method for sponge bathing under operated UE: Patient able to independently direct caregiver Donning/doffing sling/immobilizer: Patient able to independently direct caregiver Correct positioning of sling/immobilizer: Independent Pendulum exercises (written home exercise program): Patient able to independently direct caregiver ROM for elbow, wrist and digits of operated UE: Independent Sling wearing schedule (on at all times/off for ADL's): Independent Proper positioning of operated UE when showering: Independent Dressing change:  Independent Positioning of UE while sleeping: [place] expects to be discharged to:: Private residence Living Arrangements: Spouse/significant other Available Help at Discharge: Personal care attendant               Bathroom Shower/Tub: Tub/shower unit             Additional Comments: Has a CNA 3 hrs a day 5 days a week.      Prior Functioning/Environment Level of Independence: Needs assistance  Gait / Transfers Assistance Needed: Needs assistance for tub transfers ADL's / Homemaking Assistance Needed: Needs assistance for IADLs, LB dressing and had needed assistance with UB dressing due to decreased shoulder ROM            OT Problem List: Decreased strength;Decreased range of motion;Pain;Impaired UE functional use      OT Treatment/Interventions:      OT Goals(Current goals can be found in the care plan section) Acute Rehab OT Goals OT Goal Formulation: All assessment and education complete, DC therapy  OT Frequency:     Barriers to D/C:            Co-evaluation              AM-PAC OT "6 Clicks" Daily Activity     Outcome Measure Help from another person eating meals?: A Little Help  from another person taking care of personal grooming?: A Little Help from another person toileting, which includes using toliet, bedpan, or urinal?: A Little Help from another person bathing (including washing, rinsing, drying)?: A Lot Help from another person to put on and taking off regular upper body clothing?: A Little Help from another person to put on and taking off regular lower body clothing?: A Lot 6 Click Score: 16   End of Session Nurse Communication:  (UE warm, shiny and has pain - otherwise OT education complete)  Activity Tolerance: Patient limited by pain Patient left: in chair;with call bell/phone within reach  OT Visit Diagnosis: Pain Pain - Right/Left: Right Pain - part of body: Shoulder                Time:  8177-1165 OT Time Calculation (min): 37 min Charges:  OT General Charges $OT Visit: 1 Visit OT Evaluation $OT Eval Moderate Complexity: 1 Mod  Pachia Strum, OTR/L [place]  Office [phone] Pager: [place] 12/15/2020, 10:35 AM

## 2020-12-15 NOTE — [place] Notes (Signed)
Patient discharged to home w/ husband. Given all belongings, instructions, equipment. Verbalized understanding of instructions. Escorted to pov via w/c. 

## 2021-06-07 ENCOUNTER — Other Ambulatory Visit: Payer: Self-pay | Admitting: Gastroenterology

## 2021-06-07 DIAGNOSIS — R1310 Dysphagia, unspecified: Secondary | ICD-10-CM

## 2021-06-07 DIAGNOSIS — K5900 Constipation, unspecified: Secondary | ICD-10-CM

## 2021-06-07 DIAGNOSIS — K219 Gastro-esophageal reflux disease without esophagitis: Secondary | ICD-10-CM

## 2021-09-03 ENCOUNTER — Other Ambulatory Visit: Payer: Self-pay | Admitting: Nurse Practitioner

## 2021-09-03 ENCOUNTER — Encounter: Payer: Self-pay | Admitting: Internal Medicine

## 2021-09-03 NOTE — Telephone Encounter (Signed)
I have refilled X1 but needs office visit. Can be virtual if has that capability.  ?

## 2021-09-03 NOTE — Telephone Encounter (Signed)
Last office 10/19/20 ?

## 2021-10-04 ENCOUNTER — Encounter: Payer: Self-pay | Admitting: Internal Medicine

## 2021-11-05 ENCOUNTER — Encounter: Payer: Self-pay | Admitting: Internal Medicine

## 2021-12-04 ENCOUNTER — Other Ambulatory Visit: Payer: Self-pay | Admitting: Gastroenterology

## 2021-12-04 NOTE — Telephone Encounter (Signed)
Pt requests refill on her Linzess 290 mcg caps. Last ov was 10/19/2020 next ov is 6/27th with Venetia Night

## 2021-12-05 NOTE — Telephone Encounter (Signed)
Phoned and LMOVM of the pt regarding her Rx for Linzess 290 mcg caps being sent in

## 2021-12-17 NOTE — [place] Notes (Deleted)
GI Office Note    Referring Provider: Monico Blitz, MD Primary Care Physician:  Monico Blitz, MD Primary GI: Dr. Abbey Chatters  Date:  12/17/2021  ID:  Alicia Tyler, DOB 11/17/1970, MRN [id number]   Chief Complaint   No chief complaint on file.    History of Present Illness  [name] is a 51 y.o. 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   Last seen in the office 10/11/2020 for follow-up of GERD, gastroparesis, and constipation.  Reported GERD symptoms were going okay, still has belching and occasional burning in the esophagus.  She was on omeprazole 20 mg twice daily and felt like that was doing well for her.  She declined changing medication.  Zofran is controlling her nausea well interested in changing medication.  Reported a good appetite.  Stated occasional straining with constipation but would have normal stools.  Reported she thought it was time for another flexible sigmoidoscopy.  Denied any BRBPR, melena, fever, chills, intentional weight loss.  She was scheduled for flexible sigmoidoscopy, advised to continue omeprazole, Linzess to 90 mcg daily, Zofran twice daily as needed.  Flexible sigmoidoscopy performed 11/24/2020 with a single 2 mm polyp in the sigmoid colon, patent end-to-end ileocolonic anastomosis.  Pathology revealing diminutive hyperplastic polyp.  Recommended follow-up in the clinic in 1 year, repeat flex sig in 5 years.    Past Medical History:  Diagnosis Date   Anxiety    BMI (body mass index) 20.0-29.01 Apr 2010 172 LBS   Chronic back pain    Degenerative disc disease, thoracic    Gastroparesis    GERD (gastroesophageal reflux disease)    Headache(784.0)    IBS (irritable bowel syndrome)    Incontinence of feces    WEARS DEPENDS AT HS     Neuropathy    PONV (postoperative nausea and vomiting)     Pseudoobstruction of colon 08/24/2014   Seasonal allergies    Serrated adenoma of colon 05/2010    Past Surgical History:  Procedure Laterality Date   ABLATION     endometrial ablation   APPENDECTOMY     BACK SURGERY  05/14/2011   Lumbar Fusion and cage   BIOPSY  06/22/2015   Procedure: BIOPSY;  Surgeon: Danie Binder, MD;  [facility]: [facility];  Service: Endoscopy;;  gastric and esophageal bx's   BREAST LUMPECTOMY     from the left breast 2003,  2016   CHOLECYSTECTOMY     COLECTOMY N/A 08/24/2014   Procedure: TOTAL COLECTOMY;  Surgeon: Jamesetta So, MD;  Location: [facility];  Service: General;  Laterality: N/A;   COLON SURGERY     COLONOSCOPY  NOV 2011 SCREENING    HYPERPLASTIC RECTAL POLYP, SML IH, incomplete due to discomfort   COLONOSCOPY  DEC 2011 w/ PROPOFOL   SERRATED ADENOMA, HYPERPLASTIC POLY[S[   ESOPHAGEAL DILATION     ESOPHAGEAL MANOMETRY N/A 12/29/2017   Procedure: ESOPHAGEAL MANOMETRY (EM);  Surgeon: Mauri Pole, MD;  Location: [facility];  Service: Endoscopy;  Laterality: N/A;   ESOPHAGOGASTRODUODENOSCOPY (EGD) WITH PROPOFOL N/A 06/22/2015   Procedure: ESOPHAGOGASTRODUODENOSCOPY (EGD) WITH PROPOFOL;  Surgeon: Danie Binder, MD;  [facility];  Service: Endoscopy;  Laterality: N/A;   ESOPHAGOGASTRODUODENOSCOPY (EGD) WITH PROPOFOL N/A 01/21/2017   Procedure: ESOPHAGOGASTRODUODENOSCOPY (EGD) WITH PROPOFOL;  Surgeon: Danie Binder, MD;  [facility];  Service: Endoscopy;  Laterality: N/A;  9:30am   FLEXIBLE SIGMOIDOSCOPY N/A 06/22/2015   Procedure: FLEXIBLE SIGMOIDOSCOPY WITH PROPOFOL;  Surgeon: Danie Binder, MD;  [facility]: [facility];  Service: Endoscopy;  Laterality: N/A;  0830    FLEXIBLE SIGMOIDOSCOPY N/A 11/24/2020   Procedure: FLEXIBLE SIGMOIDOSCOPY;  Surgeon: Eloise Harman, DO;  [facility]: [facility];  Service: Endoscopy;  Laterality: N/A;  10:30am   HARDWARE REMOVAL N/A 09/03/2016   Procedure: Removal of Lumbar  five-Sacrum one  hardware with Metrex;  Surgeon: Kristeen Miss, MD;  Location: [facility];  Service: Neurosurgery;  Laterality: N/A;   KNEE SURGERY Bilateral    2 on righ-1 scope and one open; and one on left   MOUTH SURGERY  07/21/2013   open colectomy      OVARIAN CYST REMOVAL     POLYPECTOMY  11/24/2020   Procedure: POLYPECTOMY;  Surgeon: Eloise Harman, DO;  [facility];  Service: Endoscopy;;   SAVORY DILATION N/A 01/21/2017   Procedure: SAVORY DILATION;  Surgeon: Danie Binder, MD;  [facility];  Service: Endoscopy;  Laterality: N/A;   SMART PILL PROCEDURE N/A 07/04/2014   Procedure: SMART PILL PROCEDURE;  Surgeon: Danie Binder, MD;  [facility]: [facility];  Service: Endoscopy;  Laterality: N/A;  800   TOTAL SHOULDER ARTHROPLASTY Right 12/14/2020   Procedure: TOTAL SHOULDER ARTHROPLASTY;  Surgeon: Justice Britain, MD;  Location: [facility];  Service: Orthopedics;  Laterality: Right;  175mn   TUBAL LIGATION      Current Outpatient Medications  Medication Sig Dispense Refill   Ascorbic Acid (VITAMIN C) 1000 MG tablet Take 1,000 mg by mouth in the morning and at bedtime.     Cholecalciferol (VITAMIN D3) 50 MCG (2000 UT) TABS Take 2,000 Units by mouth daily.     cyanocobalamin (,VITAMIN B-12,) 1000 MCG/ML injection Inject 1,000 mcg into the muscle every 30 (thirty) days.     cyclobenzaprine (FLEXERIL) 10 MG tablet Take 1 tablet (10 mg total) by mouth 3 (three) times daily as needed for muscle spasms. 30 tablet 1   escitalopram (LEXAPRO) 20 MG tablet Take 20 mg by mouth daily.     furosemide (LASIX) 20 MG tablet Take 20 mg by mouth 2 (two) times a week. Mondays and Fridays     gabapentin (NEURONTIN) 600 MG tablet Take 600 mg by mouth 3 (three) times daily.      HYDROmorphone (DILAUDID) 2 MG tablet Take 1 tablet (2 mg total) by mouth every 4 (four) hours as needed. 40 tablet 0   LINZESS 290 MCG CAPS capsule TAKE ONE CAPSULE BY MOUTH EVERY DAY 30 minutes BEFORE breakfast  90 capsule 2   loratadine (CLARITIN) 10 MG tablet Take 10 mg by mouth daily.     meloxicam (MOBIC) 15 MG tablet Take 15 mg by mouth daily.     omeprazole (PRILOSEC) 20 MG capsule TAKE ONE CAPSULE BY MOUTH ONE OR TWO TIMES DAILY 30 MINUTES BEFORE A MEAL 60 capsule 11   ondansetron (ZOFRAN) 4 MG tablet Take 1 tablet (4 mg total) by mouth every 8 (eight) hours as needed for nausea or vomiting. 10 tablet 0   ondansetron (ZOFRAN-ODT) 4 MG disintegrating tablet Take 4 mg by mouth every 8 (eight) hours as needed for nausea or vomiting.     Oxycodone HCl 10 MG TABS Take 10 mg by mouth in the morning, at noon, and at bedtime.     rosuvastatin (CRESTOR) 5 MG tablet Take 5 mg by  mouth daily.     SUMAtriptan (IMITREX) 100 MG tablet Take 100 mg by mouth every 2 (two) hours as needed for migraine or headache. May repeat in 2 hours if headache persists or recurs.     Tetrahydroz-Dextran-PEG-Povid (VISINE ADVANCED RELIEF) 0.05-0.1-1-1 % SOLN Place 2 drops into both eyes 2 (two) times daily.     topiramate (TOPAMAX) 50 MG tablet Take 50 mg by mouth 2 (two) times daily.     Zinc Sulfate 220 (50 Zn) MG TABS Take 220 mg by mouth in the morning.     No current facility-administered medications for this visit.    Allergies as of 12/18/2021 - Review Complete 12/14/2020  Allergen Reaction Noted   Aspirin Shortness Of Breath, Swelling, and Rash    Bee venom Anaphylaxis 07/08/2013   Eletriptan Anaphylaxis and Swelling 04/14/2020   Codeine Nausea And Vomiting and Rash    Penicillins Other (See Comments)     Family History  Problem Relation Age of Onset   Breast cancer Maternal Aunt    Colon cancer Neg Hx    Colon polyps Neg Hx     Social History   Socioeconomic History   Marital status: Married    Spouse name: Not on file   Number of children: Not on file   Years of education: Not on file   Highest education level: Not on file  Occupational History   Not on file  Tobacco Use   Smoking status: Every  Day    Packs/day: 1.50    Years: 37.00    Total pack years: 55.50    Types: Cigarettes   Smokeless tobacco: Never   Tobacco comments:    vapes CBD oil  Vaping Use   Vaping Use: Some days  Substance and Sexual Activity   Alcohol use: Yes    Comment: once a year when they go to the beach   Drug use: No   Sexual activity: Yes    Birth control/protection: Surgical, Post-menopausal    Comment: tubal  Other Topics Concern   Not on file  Social History Narrative   DAUGHTER IS SEEING IMPAIRED. LEARNING TO PLAY VIOLIN.   Social Determinants of Health   Financial Resource Strain: Not on file  Food Insecurity: Not on file  Transportation Needs: Not on file  Physical Activity: Not on file  Stress: Not on file  Social Connections: Not on file     Review of Systems   Gen: Denies fever, chills, anorexia. Denies fatigue, weakness, weight loss.  CV: Denies chest pain, palpitations, syncope, peripheral edema, and claudication. Resp: Denies dyspnea at rest, cough, wheezing, coughing up blood, and pleurisy. GI: Denies vomiting blood, jaundice, and fecal incontinence.   Denies dysphagia or odynophagia. Derm: Denies rash, itching, dry skin Psych: Denies depression, anxiety, memory loss, confusion. No homicidal or suicidal ideation.  Heme: Denies bruising, bleeding, and enlarged lymph nodes.   Physical Exam   LMP 07/22/2011 Comment: tubal ligation/ablation  General:   Alert and oriented. No distress noted. Pleasant and cooperative.  Head:  Normocephalic and atraumatic. Eyes:  Conjuctiva clear without scleral icterus. Mouth:  Oral mucosa pink and moist. Good dentition. No lesions. Lungs:  Clear to auscultation bilaterally. No wheezes, rales, or rhonchi. No distress.  Heart:  S1, S2 present without murmurs appreciated.  Abdomen:  +BS, soft, non-tender and non-distended. No rebound or guarding. No HSM or masses noted. Rectal: *** Msk:  Symmetrical without gross deformities. Normal  posture. Extremities:  Without edema. Neurologic:  Alert  and  oriented x4 Psych:  Alert and cooperative. Normal mood and affect.   Assessment  Alicia Tyler is a 51 y.o. 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   GERD:   Gastroparesis:   Constipation:   History of colon polyps s/p subtotal colectomy: Last flex sigmoidoscopy June 2022 with a single diminutive hyperplastic polyp removed in the sigmoid colon.  Due for surveillance flexible sigmoidoscopy in 2027.  PLAN   ***     Venetia Night, MSN, FNP-BC, AGACNP-BC [facility]

## 2021-12-18 ENCOUNTER — Ambulatory Visit: Payer: Medicaid Other | Admitting: Gastroenterology

## 2021-12-18 ENCOUNTER — Encounter: Payer: Self-pay | Admitting: Internal Medicine

## 2022-01-15 NOTE — Progress Notes (Unsigned)
GI Office Note    Referring Provider: Monico Blitz, MD Primary Care Physician:  Monico Blitz, MD Primary Gastroenterologist: Dr. Abbey Chatters  Date:  01/17/2022  ID:  Alicia Tyler, DOB [birth year], MRN [id number]   Chief Complaint   Chief Complaint  Patient presents with   Follow-up    Still having issues with gas and is also having issues with trouble swallowing certain foods and medicines.     History of Present Illness  Alicia Tyler is a 51 y.o. female with a history of anxiety, gastroparesis, GERD, IBS, allergies, dysphagia, and colon polyps presenting today for follow up.  History of total colectomy 2016 with follow-up flexible sigmoidoscopy and EGD/ED also in 2016.  Flexible sigmoidoscopy with normal rectum and normal anastomosis, small internal hemorrhoids.  EGD with mild gastritis and small benign gastric polyp in the gastric fundus.  Multiple biopsies taken showing reflux changes.  Dilation performed at the proximal esophagus due to possible esophageal web.  She was advised to continue omeprazole twice daily and complete BPE if she continued to have dysphagia.  Next flex sigmoidoscopy in 5 years.  EGD performed in July 2018 after BPE showing possible obstruction near the GE junction.  EGD with mild benign-appearing intrinsic stenosis found in the esophagus s/p dilation.  If symptoms not improved in 1 month patient was to be referred for esophageal manometry.  Last seen in the office 10/11/2020 for follow-up of GERD, gastroparesis, and constipation.  Reported GERD symptoms were going okay, still has belching and occasional burning in the esophagus.  She was on omeprazole 20 mg twice daily and felt like that was doing well for her.  She declined changing medication.  Zofran is controlling her nausea well interested in changing medication.  Reported a good appetite.  Stated occasional straining with constipation but would have normal stools.  Reported she thought it was time for another  flexible sigmoidoscopy.  Denied any BRBPR, melena, fever, chills, intentional weight loss.  She was scheduled for flexible sigmoidoscopy, advised to continue omeprazole, Linzess 290 mcg daily, Zofran twice daily as needed.  Flexible sigmoidoscopy performed 11/24/2020 with a single 2 mm polyp in the sigmoid colon, patent end-to-end ileocolonic anastomosis.  Pathology revealing diminutive hyperplastic polyp. Recommended follow-up in the clinic in 1 year, repeat flex sig in 5 years.    Today: GERD - Has been having solid and pill dysphagia for a few month now. Food does feel it is getting stuck at times near the base of her neck. She notes when she vomited she felt like her vomit was getting stuck. Belching has been about the same. She does get painful mid chest hiccups at times as well, a couple of times a week.   Gastroparesis - Nausea is still there. Gas has been making her nausea a little more. Has migraines as well and that also makes her nauseas and vomit. She denies significant diet changes.   Constipation - Has been taking gas-ex and will go to the bathroom and still wont be able to pass it. Has not been eating high gas producing foods. Gas sometimes gets better after BM but not always. Always in the lower abdomen. Is having a BM everyday with the Linzess. Stools are firmer in the mornings prior to [place] and looser after. Denies melena or hematochezia. Sometimes stool is a little green after eating her cereal. Only took gas-ex a few times - [employer]. Does have cereal before going to bed at times. Uses 2% milk. Weight is  fairly stable, may have gained a few pounds.   Has not been as active lately due to all of her surgeries and the recent heat.   Sees her PCP in August, she has stopped the gabapentin due to lower extremity swelling. She wants to talk to him about her neuropathy.    Current Outpatient Medications  Medication Sig Dispense Refill   Ascorbic Acid (VITAMIN C) 1000 MG  tablet Take 1,000 mg by mouth in the morning and at bedtime.     Cholecalciferol (VITAMIN D3) 50 MCG (2000 UT) TABS Take 2,000 Units by mouth daily.     cyanocobalamin (,VITAMIN B-12,) 1000 MCG/ML injection Inject 1,000 mcg into the muscle every 30 (thirty) days.     escitalopram (LEXAPRO) 20 MG tablet Take 20 mg by mouth daily.     furosemide (LASIX) 20 MG tablet Take 20 mg by mouth 2 (two) times a week. Mondays and Fridays     LINZESS 290 MCG CAPS capsule TAKE ONE CAPSULE BY MOUTH EVERY DAY 30 minutes BEFORE breakfast 90 capsule 2   loratadine (CLARITIN) 10 MG tablet Take 10 mg by mouth daily.     methocarbamol (ROBAXIN) 750 MG tablet Take 1 tablet by mouth 2 (two) times daily.     omeprazole (PRILOSEC) 20 MG capsule TAKE ONE CAPSULE BY MOUTH ONE OR TWO TIMES DAILY 30 MINUTES BEFORE A MEAL 60 capsule 11   ondansetron (ZOFRAN-ODT) 4 MG disintegrating tablet Take 4 mg by mouth every 8 (eight) hours as needed for nausea or vomiting.     oxyCODONE-acetaminophen (PERCOCET) 10-325 MG tablet Take 1 tablet by mouth every 8 (eight) hours as needed.     prochlorperazine (COMPAZINE) 10 MG tablet Take 1 tablet by mouth 3 (three) times daily as needed.     rosuvastatin (CRESTOR) 5 MG tablet Take 5 mg by mouth daily.     SUMAtriptan (IMITREX) 100 MG tablet Take 100 mg by mouth every 2 (two) hours as needed for migraine or headache. May repeat in 2 hours if headache persists or recurs.     Tetrahydroz-Dextran-PEG-Povid (VISINE ADVANCED RELIEF) 0.05-0.1-1-1 % SOLN Place 2 drops into both eyes 2 (two) times daily.     topiramate (TOPAMAX) 50 MG tablet Take 50 mg by mouth 2 (two) times daily.     Zinc Sulfate 220 (50 Zn) MG TABS Take 220 mg by mouth in the morning.     No current facility-administered medications for this visit.    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   Seasonal allergies    Serrated adenoma of colon 05/2010    Past Surgical History:  Procedure Laterality Date   ABLATION     endometrial ablation   APPENDECTOMY     BACK SURGERY  05/14/2011   Lumbar Fusion and cage   BIOPSY  06/22/2015   Procedure: BIOPSY;  Surgeon: Danie Binder, MD;  [facility]: [facility];  Service: Endoscopy;;  gastric and esophageal bx's   BREAST LUMPECTOMY     from the left  breast 2003,  2016   CHOLECYSTECTOMY     COLECTOMY N/A 08/24/2014   Procedure: TOTAL COLECTOMY;  Surgeon: Jamesetta So, MD;  Location: [facility];  Service: General;  Laterality: N/A;   COLON SURGERY     COLONOSCOPY  NOV 2011 SCREENING    HYPERPLASTIC RECTAL POLYP, SML IH, incomplete due to discomfort   COLONOSCOPY  DEC 2011 w/ PROPOFOL   SERRATED ADENOMA, HYPERPLASTIC POLY[S[   ESOPHAGEAL DILATION     ESOPHAGEAL MANOMETRY N/A 12/29/2017   Procedure: ESOPHAGEAL MANOMETRY (EM);  Surgeon: Mauri Pole, MD;  Location: [facility];  Service: Endoscopy;  Laterality: N/A;   ESOPHAGOGASTRODUODENOSCOPY (EGD) WITH PROPOFOL N/A 06/22/2015   Procedure: ESOPHAGOGASTRODUODENOSCOPY (EGD) WITH PROPOFOL;  Surgeon: Danie Binder, MD;  [facility];  Service: Endoscopy;  Laterality: N/A;   ESOPHAGOGASTRODUODENOSCOPY (EGD) WITH PROPOFOL N/A 01/21/2017   Procedure: ESOPHAGOGASTRODUODENOSCOPY (EGD) WITH PROPOFOL;  Surgeon: Danie Binder, MD;  [facility];  Service: Endoscopy;  Laterality: N/A;  9:30am   FLEXIBLE SIGMOIDOSCOPY N/A 06/22/2015   Procedure: FLEXIBLE SIGMOIDOSCOPY WITH PROPOFOL;  Surgeon: Danie Binder, MD;  [facility];  Service: Endoscopy;  Laterality: N/A;  0830    FLEXIBLE SIGMOIDOSCOPY N/A 11/24/2020    Procedure: FLEXIBLE SIGMOIDOSCOPY;  Surgeon: Eloise Harman, DO;  [facility];  Service: Endoscopy;  Laterality: N/A;  10:30am   HARDWARE REMOVAL N/A 09/03/2016   Procedure: Removal of Lumbar five-Sacrum one  hardware with Metrex;  Surgeon: Kristeen Miss, MD;  Location: [facility];  Service: Neurosurgery;  Laterality: N/A;   KNEE SURGERY Bilateral    2 on righ-1 scope and one open; and one on left   MOUTH SURGERY  07/21/2013   open colectomy      OVARIAN CYST REMOVAL     POLYPECTOMY  11/24/2020   Procedure: POLYPECTOMY;  Surgeon: Eloise Harman, DO;  [facility];  Service: Endoscopy;;   SAVORY DILATION N/A 01/21/2017   Procedure: SAVORY DILATION;  Surgeon: Danie Binder, MD;  [facility];  Service: Endoscopy;  Laterality: N/A;   SMART PILL PROCEDURE N/A 07/04/2014   Procedure: SMART PILL PROCEDURE;  Surgeon: Danie Binder, MD;  [facility];  Service: Endoscopy;  Laterality: N/A;  800   TOTAL SHOULDER ARTHROPLASTY Right 12/14/2020   Procedure: TOTAL SHOULDER ARTHROPLASTY;  Surgeon: Justice Britain, MD;  Location: [facility];  Service: Orthopedics;  Laterality: Right;  138mn   TUBAL LIGATION      Family History  Problem Relation Age of Onset   Breast cancer Maternal Aunt    Colon cancer Neg Hx    Colon polyps Neg Hx     Allergies as of 01/17/2022 - Review Complete 01/17/2022  Allergen Reaction Noted   Aspirin Shortness Of Breath, Swelling, and Rash    Bee venom Anaphylaxis 07/08/2013   Eletriptan Anaphylaxis and Swelling 04/14/2020   Codeine Nausea And Vomiting and Rash    Penicillins Other (See Comments)     Social History   Socioeconomic History   Marital status: Married    Spouse name: Not on file   Number of children: Not on file   Years of education: Not on file   Highest education level: Not on file  Occupational History   Not on file  Tobacco Use   Smoking status: Every Day    Packs/day: 1.00    Years: 37.00    Total  pack years: 37.00    Types:  Cigarettes   Smokeless tobacco: Never   Tobacco comments:    vapes CBD oil  Vaping Use   Vaping Use: Some days  Substance and Sexual Activity   Alcohol use: Yes    Comment: once a year when they go to the beach   Drug use: No   Sexual activity: Yes    Birth control/protection: Surgical, Post-menopausal    Comment: tubal  Other Topics Concern   Not on file  Social History Narrative   DAUGHTER IS SEEING IMPAIRED. LEARNING TO PLAY VIOLIN.   Social Determinants of Health   Financial Resource Strain: Not on file  Food Insecurity: Not on file  Transportation Needs: Not on file  Physical Activity: Not on file  Stress: Not on file  Social Connections: Not on file     Review of Systems   Gen: Denies fever, chills, anorexia. Denies fatigue, weakness, weight loss.  CV: Denies chest pain, palpitations, syncope, peripheral edema, and claudication. Resp: Denies dyspnea at rest, cough, wheezing, coughing up blood, and pleurisy. GI: See HPI Derm: Denies rash, itching, dry skin Psych: Denies depression, anxiety, memory loss, confusion. No homicidal or suicidal ideation.  Heme: Denies bruising, bleeding, and enlarged lymph nodes.   Physical Exam   BP 130/82 (BP Location: Left Arm, Patient Position: Sitting, Cuff Size: Normal)   Pulse 80   Temp 98.3 F (36.8 C) (Oral)   Ht '5\' 3"'$  (1.6 m)   Wt 161 lb 9.6 oz (73.3 kg)   LMP 07/22/2011 Comment: tubal ligation/ablation  SpO2 97%   BMI 28.63 kg/m   General:   Alert and oriented. No distress noted. Pleasant and cooperative.  Head:  Normocephalic and atraumatic. Eyes:  Conjuctiva clear without scleral icterus. Mouth:  Oral mucosa pink and moist. Good dentition. No lesions. Lungs:  Clear to auscultation bilaterally. No wheezes, rales, or rhonchi. No distress.  Heart:  S1, S2 present without murmurs appreciated.  Abdomen:  +BS, soft, non-distended. Tenderness noted to mid lower abdomen and epigastrium.  Midline scar.  Rectal: deferred Msk:  Symmetrical without gross deformities. Normal posture. Extremities:  Without edema. Neurologic:  Alert and  oriented x4 Psych:  Alert and cooperative. Normal mood and affect.   Assessment  Alicia Tyler is a 51 y.o. female with a history of anxiety, gastroparesis, GERD, IBS, allergies, dysphagia, colon polyps, and sub total colectomy due to delayed colonic emptying and possible pseudo-obstruction in 2016, presenting today for follow up with complaint of dysphagia and inability to pass gas.  GERD/Dysphagia: Has been doing fairly well on omeprazole 20 mg BID however she continues to experience frequent belching and ongoing gas pains. She denies burning to her throat or sour taste. She began experiencing increased dysphagia to solids and pills a couple of months ago with occasional feeling like food is getting stuck mid chest. She reported a vomiting episode a week ago where she got choked on her emesis. Given her symptoms are not much improved or slightly worsened we will switch her from omeprazole 20 mg daily to pantoprazole 40 mg daily. Her last EGD was in 2018 where she did have dilation of mild intrinsic stenosis. We will repeat BPE to assess for the same and schedule EGD if needed. She may need manometry if no structural abnormality identified.   Constipation: Has been maintained on Linzess 290 mcg daily. Has been having loose daily stools after taking her medication. She denies frequent straining however if she feels the need to defecate prior to taking her medication she  is usually unable to go. Her most significant complaint is excessive gas production with the inability to pass gas at times and will have associated abdominal cramping with it. She has taken gas ex a couple of time without much relief but it is sometimes relieved with defecation. We will continue her current regimen. I have encouraged her to ensure she is drinking plenty of water with at least  2-3 bottles of water a day and to avoid common gas producing foods. This increased production could be worsening of her gastroparesis and/or constipation. I have recommended phazyme (simethicone) with max 500 gm a day and to increase her exercise as tolerated as she has been less active over the last year due to multiple abdominal surgeries. Low FODMAP diet handout provided.   Gastroparesis: Continues to have daily nausea and has been using her Zofran 2-3 times daily as needed. She has had an increased amount of migraines lately that have contributed to a slight increase in Zofran use. As stated above she has been having issues with excessive gas trapping and inability to pass gas at times which leads to abdominal discomfort and further nausea. She has only had rare vomiting episodes which have only occurred with concurrent migraines. Will continue Linzess has stated above and trial more frequent simethicone and dietary changes.   History of colon polyps: Status post subtotal colectomy in 2016.  Recent flex sigmoidoscopy performed in July 2022 with single 2 mm hyperplastic polyp in the sigmoid colon.  Due for surveillance in 2027.  Previously single enema prep has worked for her in the past.   PLAN   BPE, possible EGD if abnormal Continue Linzess 290 mcg daily Avoid gas producing foods  Simethicone as needed, max 500 mg daily.  Low FODMAP diet Stop omeprazole, start pantoprazole 40 mg daily.  Continue Zofran twice daily as needed Exercise as tolerated.  Flexible sigmoidoscopy in 2027 Follow-up in 6 months    Venetia Night, MSN, FNP-BC, AGACNP-BC [facility]

## 2022-01-17 ENCOUNTER — Ambulatory Visit (INDEPENDENT_AMBULATORY_CARE_PROVIDER_SITE_OTHER): Payer: Medicaid Other | Admitting: Gastroenterology

## 2022-01-17 ENCOUNTER — Encounter: Payer: Self-pay | Admitting: Gastroenterology

## 2022-01-17 ENCOUNTER — Encounter: Payer: Self-pay | Admitting: *Deleted

## 2022-01-17 VITALS — BP 130/82 | HR 80 | Temp 98.3°F | Ht 63.0 in | Wt 161.6 lb

## 2022-01-17 DIAGNOSIS — K219 Gastro-esophageal reflux disease without esophagitis: Secondary | ICD-10-CM

## 2022-01-17 DIAGNOSIS — R140 Abdominal distension (gaseous): Secondary | ICD-10-CM | POA: Diagnosis not present

## 2022-01-17 DIAGNOSIS — K3184 Gastroparesis: Secondary | ICD-10-CM

## 2022-01-17 DIAGNOSIS — K581 Irritable bowel syndrome with constipation: Secondary | ICD-10-CM

## 2022-01-17 DIAGNOSIS — R1310 Dysphagia, unspecified: Secondary | ICD-10-CM

## 2022-01-17 MED ORDER — PANTOPRAZOLE SODIUM 40 MG PO TBEC: 40.0000 mg | DELAYED_RELEASE_TABLET | Freq: Two times a day (BID) | ORAL | 3 refills | Status: DC

## 2022-01-17 NOTE — Patient Instructions (Addendum)
For your increased gas: I want you to avoid common gas producing foods such as (beans, onions, celery, carrots, raisins, bananas, apricots, prunes, Brussels sprouts, wheat germ, pretzels, and bagels). You can continue to take over the counter anti-gas medications such as gas-ex however I do recommend Phazyme, the maximum dose of the main ingredient simethicone is 500 mg/day. You can take 125 mg up to 4 times a day or 250 mg twice a day.  Decreasing your fiber intake may help with the gas as well.  Low FODMAP diet may also be helpful It is possible that your gastroparesis is the cause of your increased gas, if these other regimens are unhelpful we can explore additional medications for your gastroparesis.   For your constipation: Continue Linzess 290 mcg daily.  Ensure you are drinking at least 2-3 bottles of water a day, ensuring your urine is clear/straw colored  For your GERD/swallowing issue: Stop taking the omeprazole and start taking pantoprazole 40 mg twice daily.  We are going to schedule you for a barium pill esophagram to assess your swallowing.   Will have you follow up in 6 months.   It was a pleasure to see you today. I want to create trusting relationships with patients. If you receive a survey regarding your visit,  I greatly appreciate you taking time to fill this out on paper or through your MyChart. I value your feedback.  Venetia Night, MSN, FNP-BC, AGACNP-BC [facility]

## 2022-01-24 ENCOUNTER — Telehealth: Payer: Self-pay | Admitting: *Deleted

## 2022-01-24 ENCOUNTER — Ambulatory Visit (HOSPITAL_COMMUNITY)
Admission: RE | Admit: 2022-01-24 | Discharge: 2022-01-24 | Disposition: A | Discharge status: Home / Self Care | Payer: Medicaid Other | Source: Ambulatory Visit | Attending: Gastroenterology | Admitting: Gastroenterology

## 2022-01-24 DIAGNOSIS — R1310 Dysphagia, unspecified: Secondary | ICD-10-CM | POA: Insufficient documentation

## 2022-01-24 NOTE — Telephone Encounter (Signed)
[facility] called wanted to make sure the barium swallow study was in the pt's chart. Informed them that the results were in the chart.

## 2022-01-25 ENCOUNTER — Other Ambulatory Visit: Payer: Self-pay | Admitting: *Deleted

## 2022-01-25 DIAGNOSIS — R1310 Dysphagia, unspecified: Secondary | ICD-10-CM

## 2022-01-25 NOTE — Telephone Encounter (Signed)
Noted  

## 2022-01-31 ENCOUNTER — Encounter: Payer: Self-pay | Admitting: *Deleted

## 2022-03-15 ENCOUNTER — Ambulatory Visit: Payer: Medicaid Other | Admitting: Plastic Surgery

## 2022-03-15 VITALS — BP 119/78 | HR 78 | Ht 62.0 in | Wt 166.4 lb

## 2022-03-15 DIAGNOSIS — N62 Hypertrophy of breast: Secondary | ICD-10-CM | POA: Diagnosis not present

## 2022-03-15 DIAGNOSIS — Z6830 Body mass index (BMI) 30.0-30.9, adult: Secondary | ICD-10-CM

## 2022-03-15 DIAGNOSIS — M546 Pain in thoracic spine: Secondary | ICD-10-CM

## 2022-03-15 DIAGNOSIS — F17210 Nicotine dependence, cigarettes, uncomplicated: Secondary | ICD-10-CM

## 2022-03-15 DIAGNOSIS — M25512 Pain in left shoulder: Secondary | ICD-10-CM

## 2022-03-15 DIAGNOSIS — M545 Low back pain, unspecified: Secondary | ICD-10-CM

## 2022-03-15 DIAGNOSIS — M542 Cervicalgia: Secondary | ICD-10-CM | POA: Diagnosis not present

## 2022-03-15 DIAGNOSIS — M25511 Pain in right shoulder: Secondary | ICD-10-CM

## 2022-03-15 DIAGNOSIS — Z803 Family history of malignant neoplasm of breast: Secondary | ICD-10-CM

## 2022-03-18 NOTE — Progress Notes (Signed)
Referring Provider Monico Blitz, MD [address] [place],  [place] [address]   CC:  Breast hypertrophy   Alicia Tyler is an 51 y.o. female.  HPI:  The patient is a 51 y.o. female with a history of mammary hyperplasia for several years.  She has extremely large breasts causing symptoms that include the following: Back pain in the upper and lower back, including neck pain. She pulls or pins her bra straps to provide better lift and relief of the pressure and pain. She notices relief by holding her breast up manually.  Her shoulder straps cause grooves and pain and pressure that requires padding for relief. Pain medication is sometimes required with motrin and tylenol.  Activities that are hindered by enlarged breasts include: exercise and running.  She has tried supportive clothing as well as fitted bras without improvement.     Mammogram history: 01/2021. Normal, aware will have to repeat.  Family history of breast cancer:  maternal aunts.  Tobacco use:  current daily smoker.   The patient expresses the desire to pursue surgical intervention.  The BMI = 30.  Preoperative bra size = unkown cup.   Allergies  Allergen Reactions   Aspirin Shortness Of Breath, Swelling and Rash   Bee Venom Anaphylaxis        Eletriptan Anaphylaxis and Swelling   Codeine Nausea And Vomiting and Rash   Penicillins Other (See Comments)    Makes hair fall out  Tolerates ANCEF Has patient had a PCN reaction causing immediate rash, facial/tongue/throat swelling, SOB or lightheadedness with hypotension: unknown Has patient had a PCN reaction causing severe rash involving mucus membranes or skin necrosis: unknown Has patient had a PCN reaction that required hospitalization unknown Has patient had a PCN reaction occurring within the last 10 years: No If all of the above answers are "NO", then may proceed with Cephalosporin use.      Outpatient Encounter Medications as of 03/15/2022  Medication Sig   Ascorbic Acid  (VITAMIN C) 1000 MG tablet Take 1,000 mg by mouth in the morning and at bedtime.   Cholecalciferol (VITAMIN D3) 50 MCG (2000 UT) TABS Take 2,000 Units by mouth daily.   cyanocobalamin (,VITAMIN B-12,) 1000 MCG/ML injection Inject 1,000 mcg into the muscle every 30 (thirty) days.   escitalopram (LEXAPRO) 20 MG tablet Take 20 mg by mouth daily.   furosemide (LASIX) 20 MG tablet Take 20 mg by mouth 2 (two) times a week. Mondays and Fridays   LINZESS 290 MCG CAPS capsule TAKE ONE CAPSULE BY MOUTH EVERY DAY 30 minutes BEFORE breakfast   loratadine (CLARITIN) 10 MG tablet Take 10 mg by mouth daily.   methocarbamol (ROBAXIN) 750 MG tablet Take 1 tablet by mouth 2 (two) times daily.   ondansetron (ZOFRAN-ODT) 4 MG disintegrating tablet Take 4 mg by mouth every 8 (eight) hours as needed for nausea or vomiting.   oxyCODONE-acetaminophen (PERCOCET) 10-325 MG tablet Take 1 tablet by mouth every 8 (eight) hours as needed.   pantoprazole (PROTONIX) 40 MG tablet Take 1 tablet (40 mg total) by mouth 2 (two) times daily.   prochlorperazine (COMPAZINE) 10 MG tablet Take 1 tablet by mouth 3 (three) times daily as needed.   rosuvastatin (CRESTOR) 5 MG tablet Take 5 mg by mouth daily.   SUMAtriptan (IMITREX) 100 MG tablet Take 100 mg by mouth every 2 (two) hours as needed for migraine or headache. May repeat in 2 hours if headache persists or recurs.   Tetrahydroz-Dextran-PEG-Povid [facility] ADVANCED  RELIEF) 0.05-0.1-1-1 % SOLN Place 2 drops into both eyes 2 (two) times daily.   topiramate (TOPAMAX) 50 MG tablet Take 50 mg by mouth 2 (two) times daily.   Zinc Sulfate 220 (50 Zn) MG TABS Take 220 mg by mouth in the morning.   No facility-administered encounter medications on file as of 03/15/2022.     Past Medical History:  Diagnosis Date   Anxiety    BMI (body mass index) 20.0-29.01 Apr 2010 172 LBS   Chronic back pain    Degenerative disc disease, thoracic    Gastroparesis    GERD (gastroesophageal reflux  disease)    Headache(784.0)    IBS (irritable bowel syndrome)    Incontinence of feces    WEARS DEPENDS AT HS     Neuropathy    PONV (postoperative nausea and vomiting)    Pseudoobstruction of colon 08/24/2014   Seasonal allergies    Serrated adenoma of colon 05/2010    Past Surgical History:  Procedure Laterality Date   ABLATION     endometrial ablation   APPENDECTOMY     BACK SURGERY  05/14/2011   Lumbar Fusion and cage   BIOPSY  06/22/2015   Procedure: BIOPSY;  Surgeon: Danie Binder, MD;  [facility];  Service: Endoscopy;;  gastric and esophageal bx's   BREAST LUMPECTOMY     from the left breast 2003,  2016   CHOLECYSTECTOMY     COLECTOMY N/A 08/24/2014   Procedure: TOTAL COLECTOMY;  Surgeon: Jamesetta So, MD;  [facility]: [facility];  Service: General;  Laterality: N/A;   COLON SURGERY     COLONOSCOPY  NOV 2011 SCREENING    HYPERPLASTIC RECTAL POLYP, SML IH, incomplete due to discomfort   COLONOSCOPY  DEC 2011 w/ PROPOFOL   SERRATED ADENOMA, HYPERPLASTIC POLY[S[   ESOPHAGEAL DILATION     ESOPHAGEAL MANOMETRY N/A 12/29/2017   Procedure: ESOPHAGEAL MANOMETRY (EM);  Surgeon: Mauri Pole, MD;  [facility];  Service: Endoscopy;  Laterality: N/A;   ESOPHAGOGASTRODUODENOSCOPY (EGD) WITH PROPOFOL N/A 06/22/2015   Procedure: ESOPHAGOGASTRODUODENOSCOPY (EGD) WITH PROPOFOL;  Surgeon: Danie Binder, MD;  [facility];  Service: Endoscopy;  Laterality: N/A;   ESOPHAGOGASTRODUODENOSCOPY (EGD) WITH PROPOFOL N/A 01/21/2017   Procedure: ESOPHAGOGASTRODUODENOSCOPY (EGD) WITH PROPOFOL;  Surgeon: Danie Binder, MD;  [facility];  Service: Endoscopy;  Laterality: N/A;  9:30am   FLEXIBLE SIGMOIDOSCOPY N/A 06/22/2015   Procedure: FLEXIBLE SIGMOIDOSCOPY WITH PROPOFOL;  Surgeon: Danie Binder, MD;  [facility];  Service: Endoscopy;  Laterality: N/A;  0830    FLEXIBLE SIGMOIDOSCOPY N/A 11/24/2020   Procedure: FLEXIBLE  SIGMOIDOSCOPY;  Surgeon: Eloise Harman, DO;  [facility];  Service: Endoscopy;  Laterality: N/A;  10:30am   HARDWARE REMOVAL N/A 09/03/2016   Procedure: Removal of Lumbar five-Sacrum one  hardware with Metrex;  Surgeon: Kristeen Miss, MD;  Location: [facility];  Service: Neurosurgery;  Laterality: N/A;   KNEE SURGERY Bilateral    2 on righ-1 scope and one open; and one on left   MOUTH SURGERY  07/21/2013   open colectomy      OVARIAN CYST REMOVAL     POLYPECTOMY  11/24/2020   Procedure: POLYPECTOMY;  Surgeon: Eloise Harman, DO;  [facility];  Service: Endoscopy;;   SAVORY DILATION N/A 01/21/2017   Procedure: SAVORY DILATION;  Surgeon: Danie Binder, MD;  [facility];  Service: Endoscopy;  Laterality: N/A;  SMART PILL PROCEDURE N/A 07/04/2014   Procedure: SMART PILL PROCEDURE;  Surgeon: Danie Binder, MD;  [facility];  Service: Endoscopy;  Laterality: N/A;  800   TOTAL SHOULDER ARTHROPLASTY Right 12/14/2020   Procedure: TOTAL SHOULDER ARTHROPLASTY;  Surgeon: Justice Britain, MD;  [facility];  Service: Orthopedics;  Laterality: Right;  181mn   TUBAL LIGATION      Family History  Problem Relation Age of Onset   Breast cancer Maternal Aunt    Colon cancer Neg Hx    Colon polyps Neg Hx     Social History   Social History Narrative   DAUGHTER IS SEEING IMPAIRED. LEARNING TO PLAY VIOLIN.     Review of Systems General: Denies fevers, chills, weight loss CV: Denies chest pain, shortness of breath, palpitations   Physical Exam    03/15/2022    1:25 PM 01/17/2022    8:28 AM 12/15/2020    5:33 AM  Vitals with BMI  Height '5\' 2"'$  '5\' 3"'$    Weight 166 lbs 6 oz 161 lbs 10 oz   BMI 363.33254.56  Systolic [id number] Diastolic 78 82 71  Pulse 78 80 76    General:  No acute distress,  Alert and oriented, Non-Toxic, Normal speech and affect Breast: No easily palpable breast masses on physical exam, significant breast ptosis and  macromastia. Her breasts are extremely large and fairly symmetric.  She has hyperpigmentation of the inframammary area on both sides.  The sternal to nipple distance on the right is 31 cm and the left is 31 cm.  The IMF distance is 12 cm on the right and 13 cm on the left.  Base width is 18 bilaterally. Assessment/Plan   The patient has bilateral symptomatic macromastia.  She is a good candidate for a breast reduction if she can stop all nicotine.  We will test her urine cotinine after 4 weeks of no nicotine.  She is interested in pursuing surgical treatment.  She has tried supportive garments and fitted bras with no relief.  The details of breast reduction surgery were discussed.  I explained the procedure in detail along the with the expected scars.  The risks were discussed in detail and include bleeding, infection, damage to surrounding structures, need for additional procedures, nipple loss, change in nipple sensation, persistent pain, contour irregularities and asymmetries.  I explained that breast feeding is often not possible after breast reduction surgery.  We discussed the expected postoperative course with an overall recovery period of about 1 month.  She demonstrated full understanding of all risks.  We discussed her personal risk factors that include tobacco use.  The patient is interested in pursuing surgical treatment.  The estimated excess breast tissue to be removed at the time of surgery = >500 grams on the left and >500 grams on the right. DLennice Sites9/25/2023, 7:55 AM

## 2022-05-02 ENCOUNTER — Ambulatory Visit (HOSPITAL_COMMUNITY): Payer: Medicaid Other | Attending: Plastic Surgery | Admitting: Physical Therapy

## 2022-05-02 DIAGNOSIS — N62 Hypertrophy of breast: Secondary | ICD-10-CM | POA: Diagnosis not present

## 2022-05-02 DIAGNOSIS — M546 Pain in thoracic spine: Secondary | ICD-10-CM | POA: Insufficient documentation

## 2022-05-02 NOTE — Therapy (Signed)
OUTPATIENT PHYSICAL THERAPY THORACOLUMBAR EVALUATION   Patient Name: Alicia Tyler MRN: [id number] DOB:April 17, 1971, 51 y.o., female Today's Date: 05/02/2022   PT End of Session - 05/02/22 1140    Visit Number 1    Number of Visits 6    Date for PT Re-Evaluation 05/23/22    Authorization Type [place] med[employer]- request 6 visits    Progress Note Due on Visit 7    PT Start Time 1115    PT Stop Time 1140    PT Time Calculation (min) 25 min    Activity Tolerance Patient tolerated treatment well    Behavior During Therapy WFL for tasks assessed/performed             Past Medical History:  Diagnosis Date   Anxiety    BMI (body mass index) 20.0-29.01 Apr 2010 172 LBS   Chronic back pain    Degenerative disc disease, thoracic    Gastroparesis    GERD (gastroesophageal reflux disease)    Headache(784.0)    IBS (irritable bowel syndrome)    Incontinence of feces    WEARS DEPENDS AT HS     Neuropathy    PONV (postoperative nausea and vomiting)    Pseudoobstruction of colon 08/24/2014   Seasonal allergies    Serrated adenoma of colon 05/2010   Past Surgical History:  Procedure Laterality Date   ABLATION     endometrial ablation   APPENDECTOMY     BACK SURGERY  05/14/2011   Lumbar Fusion and cage   BIOPSY  06/22/2015   Procedure: BIOPSY;  Surgeon: Danie Binder, MD;  [facility];  Service: Endoscopy;;  gastric and esophageal bx's   BREAST LUMPECTOMY     from the left breast 2003,  2016   CHOLECYSTECTOMY     COLECTOMY N/A 08/24/2014   Procedure: TOTAL COLECTOMY;  Surgeon: Jamesetta So, MD;  [facility];  Service: General;  Laterality: N/A;   COLON SURGERY     COLONOSCOPY  NOV 2011 SCREENING    HYPERPLASTIC RECTAL POLYP, SML IH, incomplete due to discomfort   COLONOSCOPY  DEC 2011 w/ PROPOFOL   SERRATED ADENOMA, HYPERPLASTIC POLY[S[   ESOPHAGEAL DILATION     ESOPHAGEAL MANOMETRY N/A 12/29/2017   Procedure: ESOPHAGEAL MANOMETRY (EM);  Surgeon:  Mauri Pole, MD;  [facility];  Service: Endoscopy;  Laterality: N/A;   ESOPHAGOGASTRODUODENOSCOPY (EGD) WITH PROPOFOL N/A 06/22/2015   Procedure: ESOPHAGOGASTRODUODENOSCOPY (EGD) WITH PROPOFOL;  Surgeon: Danie Binder, MD;  [facility];  Service: Endoscopy;  Laterality: N/A;   ESOPHAGOGASTRODUODENOSCOPY (EGD) WITH PROPOFOL N/A 01/21/2017   Procedure: ESOPHAGOGASTRODUODENOSCOPY (EGD) WITH PROPOFOL;  Surgeon: Danie Binder, MD;  [facility];  Service: Endoscopy;  Laterality: N/A;  9:30am   FLEXIBLE SIGMOIDOSCOPY N/A 06/22/2015   Procedure: FLEXIBLE SIGMOIDOSCOPY WITH PROPOFOL;  Surgeon: Danie Binder, MD;  [facility];  Service: Endoscopy;  Laterality: N/A;  0830    FLEXIBLE SIGMOIDOSCOPY N/A 11/24/2020   Procedure: FLEXIBLE SIGMOIDOSCOPY;  Surgeon: Eloise Harman, DO;  [facility];  Service: Endoscopy;  Laterality: N/A;  10:30am   HARDWARE REMOVAL N/A 09/03/2016   Procedure: Removal of Lumbar five-Sacrum one  hardware with Metrex;  Surgeon: Kristeen Miss, MD;  Location: [facility];  Service: Neurosurgery;  Laterality: N/A;   KNEE SURGERY Bilateral    2 on righ-1 scope and one open; and one on left   MOUTH SURGERY  07/21/2013   open colectomy  OVARIAN CYST REMOVAL     POLYPECTOMY  11/24/2020   Procedure: POLYPECTOMY;  Surgeon: Eloise Harman, DO;  [facility];  Service: Endoscopy;;   SAVORY DILATION N/A 01/21/2017   Procedure: SAVORY DILATION;  Surgeon: Danie Binder, MD;  [facility];  Service: Endoscopy;  Laterality: N/A;   SMART PILL PROCEDURE N/A 07/04/2014   Procedure: SMART PILL PROCEDURE;  Surgeon: Danie Binder, MD;  [facility];  Service: Endoscopy;  Laterality: N/A;  800   TOTAL SHOULDER ARTHROPLASTY Right 12/14/2020   Procedure: TOTAL SHOULDER ARTHROPLASTY;  Surgeon: Justice Britain, MD;  [facility];  Service: Orthopedics;  Laterality: Right;  166mn   TUBAL LIGATION      Patient Active Problem List   Diagnosis Date Noted   Status post total shoulder arthroplasty, right 12/14/2020   Nausea without vomiting 10/19/2020   [place] 09/28/2020   Intractable vomiting 09/28/2020   Hypercalcemia 07/27/2019   RUQ abdominal pain 01/21/2019   Paresthesias/numbness 05/06/2018   Bloating 12/17/2017   Constipation 09/09/2017   Lumbar pseudoarthrosis 09/03/2016   Dysphagia 05/31/2015   [place] (acute renal failure) [place] 09/03/2014   UTI (urinary tract infection) 09/03/2014   Gastroparesis 07/15/2014   GERD (gastroesophageal reflux disease) 06/29/2014   Serrated adenoma of colon 01/17/2011   Irritable bowel syndrome 04/12/2010    PCP: SMonico Blitz REFERRING PROVIDER: DLennice Sites REFERRING DIAG:  Diagnosis  N62 (ICD-10-CM) - Breast hypertrophy    Rationale for Evaluation and Treatment: Rehabilitation  THERAPY DIAG:  Pain in thoracic spine  ONSET DATE: chronic  SUBJECTIVE:                                                                                                                                                                                           SUBJECTIVE STATEMENT: Pt states that she has had two low back surgeries the last being March of 2018 but now she is having pain between her shoulder blades.  She wants to have a breast reduction but can not until she has 6 weeks of therapy. She states swimming in her pool helps her pain.   PERTINENT HISTORY: 2 Low back surgeries,  Rt Total shoulder replacement, pt has a daily aide.   PAIN:  Are you having pain? Yes: NPRS scale: 7/10 thoracic ; 5 in low back  Pain location: between shoulder blades  Pain description: sharp burning  Aggravating factors: being upright  Relieving factors: heat and pain meds   PRECAUTIONS: None  WEIGHT BEARING RESTRICTIONS: No  FALLS:  Has patient fallen in last 6 months? No  LIVING ENVIRONMENT: Lives with: lives with their  family Lives in: House/apartment OCCUPATION: unemployed   PLOF: Needs assistance with homemaking  PATIENT GOALS: less pain  NEXT MD VISIT: after therapy   OBJECTIVE:   DIAGNOSTIC FINDINGS:  None   PATIENT SURVEYS:  FOTO 37   COGNITION: Overall cognitive status: Within functional limits for tasks assessed     SENSATION: WFL   POSTURE: rounded shoulders, decreased lumbar lordosis, and increased thoracic kyphosis  UE AROM: WFL but has significant increased pain and moves very slowly UE STrength overall for flexion/abduction, ER, horizontal abduction and adduction  are all 3+/5    TODAY'S TREATMENT:                                                                                                                              DATE: 05/02/2022  Holding good posture for 10 seconds x 5 Scapular retraction x 10 B ER x 10    PATIENT EDUCATION:  Education details: The importance of good posture; HEp  Person educated: Patient Education method: Explanation Education comprehension: verbalized understanding and returned demonstration  HOME EXERCISE PROGRAM: Access Code: [email or web address] URL: [email or web address] Date: 05/02/2022 Prepared by: Rayetta Humphrey  Exercises - Correct Seated Posture  - 3 x daily - 7 x weekly - 1 sets - 5-10 reps - 3-5 seconds  hold - Seated Scapular Retraction  - 3 x daily - 7 x weekly - 1 sets - 10 reps - 3-5 seconds  hold - Seated Shoulder External Rotation  - 3 x daily - 7 x weekly - 1 sets - 10 reps - 3-5 seconds  hold  ASSESSMENT:  CLINICAL IMPRESSION: Patient is a 51 y.o. female  who was seen today for physical therapy evaluation and treatment for breast hypertrophy causing thoracic pain. Evaluation demonstrates poor posture and decreased core strength.  Ms. Tobin Chad will benefit from skilled PT to improve pt awareness of her posture and improve her core strength to assist in improving her posture and pain.   OBJECTIVE IMPAIRMENTS:  decreased strength, impaired UE functional use, postural dysfunction, obesity, and pain.   ACTIVITY LIMITATIONS: carrying, lifting, standing, and reach over head  PARTICIPATION LIMITATIONS:  Pt has an aide who does all housework pt basically does not physical activity due to low back issues.   PERSONAL FACTORS: Behavior pattern, Fitness, Time since onset of injury/illness/exacerbation, and 1-2 comorbidities: 2 previous back surgeries, past hernia repair whic pt states MD stated not to lift anything as she might get another hernia   are also affecting patient's functional outcome.   REHAB POTENTIAL: Fair    CLINICAL DECISION MAKING: Stable/uncomplicated  EVALUATION COMPLEXITY: Low   GOALS: Goals reviewed with patient? No  SHORT TERM GOALS: Target date: 05/23/2022  Pt to be I in a HEP to decrease pain to 5/10 Baseline: Goal status: INITIAL  2.  Pt to be increase core strength by 1//2 grade to allow pt to be able to put dishes  up without increased pain.  Baseline:  Goal status: INITIAL     LONG TERM GOALS: Target date: 06/13/2022  Pt to be I in advance HEP to allow thoracic pain to decrease to no greater than a 3/10 Baseline:  Goal status: INITIAL  2.  Pt to demonstrate good posture.  Baseline:  Goal status: INITIAL  3.  PT core strength to be increased one grade to allow pt to be able to fold clothes without increased pain.  Baseline:  Goal status: INITIAL    PLAN:  PT FREQUENCY: 2x/week  PT DURATION: 6 weeks  PLANNED INTERVENTIONS: Therapeutic exercises, Therapeutic activity, Patient/Family education, Self Care, and Manual therapy.  PLAN FOR NEXT SESSION: Thoracic excursions, theraband postural exercises; decompression theraband exercises, abdominal set, wall push ups.....  Rayetta Humphrey, PT CLT [phone]  05/02/2022, 11:59 AM

## 2022-05-21 ENCOUNTER — Ambulatory Visit (HOSPITAL_COMMUNITY): Payer: Medicaid Other

## 2022-05-21 ENCOUNTER — Encounter (HOSPITAL_COMMUNITY): Payer: Self-pay

## 2022-05-21 DIAGNOSIS — M546 Pain in thoracic spine: Secondary | ICD-10-CM

## 2022-05-21 NOTE — Therapy (Signed)
OUTPATIENT PHYSICAL THERAPY THORACOLUMBAR EVALUATION   Patient Name: Alicia Tyler MRN: [id number] DOB:[birth year], 51 y.o., female Today's Date: 05/21/2022   05/21/22 1601  PT Visits / Re-Eval  Visit Number 2  Number of Visits 6  Date for PT Re-Evaluation 05/23/22  Authorization  Authorization Type [place] medicaid healthy blue- request 6 visits  Authorization Time Period 9 visits approved from 11/13-->07/04/22  Authorization - Visit Number 2  Authorization - Number of Visits 9  Progress Note Due on Visit 7  PT Time Calculation  PT Start Time 1508  PT Stop Time 8502  PT Time Calculation (min) 47 min  PT - End of Session  Activity Tolerance Patient tolerated treatment well  Behavior During Therapy WFL for tasks assessed/performed   Past Medical History:  Diagnosis Date   Anxiety    BMI (body mass index) 20.0-29.01 Apr 2010 172 LBS   Chronic back pain    Degenerative disc disease, thoracic    Gastroparesis    GERD (gastroesophageal reflux disease)    Headache(784.0)    IBS (irritable bowel syndrome)    Incontinence of feces    WEARS DEPENDS AT HS     Neuropathy    PONV (postoperative nausea and vomiting)    Pseudoobstruction of colon 08/24/2014   Seasonal allergies    Serrated adenoma of colon 05/2010   Past Surgical History:  Procedure Laterality Date   ABLATION     endometrial ablation   APPENDECTOMY     BACK SURGERY  05/14/2011   Lumbar Fusion and cage   BIOPSY  06/22/2015   Procedure: BIOPSY;  Surgeon: Danie Binder, MD;  [facility];  Service: Endoscopy;;  gastric and esophageal bx's   BREAST LUMPECTOMY     from the left breast 2003,  2016   CHOLECYSTECTOMY     COLECTOMY N/A 08/24/2014   Procedure: TOTAL COLECTOMY;  Surgeon: Jamesetta So, MD;  [facility]: [facility];  Service: General;  Laterality: N/A;   COLON SURGERY     COLONOSCOPY  NOV 2011 SCREENING    HYPERPLASTIC RECTAL POLYP, SML IH, incomplete due to discomfort   COLONOSCOPY  DEC 2011 w/  PROPOFOL   SERRATED ADENOMA, HYPERPLASTIC POLY[S[   ESOPHAGEAL DILATION     ESOPHAGEAL MANOMETRY N/A 12/29/2017   Procedure: ESOPHAGEAL MANOMETRY (EM);  Surgeon: Mauri Pole, MD;  [facility];  Service: Endoscopy;  Laterality: N/A;   ESOPHAGOGASTRODUODENOSCOPY (EGD) WITH PROPOFOL N/A 06/22/2015   Procedure: ESOPHAGOGASTRODUODENOSCOPY (EGD) WITH PROPOFOL;  Surgeon: Danie Binder, MD;  [facility];  Service: Endoscopy;  Laterality: N/A;   ESOPHAGOGASTRODUODENOSCOPY (EGD) WITH PROPOFOL N/A 01/21/2017   Procedure: ESOPHAGOGASTRODUODENOSCOPY (EGD) WITH PROPOFOL;  Surgeon: Danie Binder, MD;  [facility];  Service: Endoscopy;  Laterality: N/A;  9:30am   FLEXIBLE SIGMOIDOSCOPY N/A 06/22/2015   Procedure: FLEXIBLE SIGMOIDOSCOPY WITH PROPOFOL;  Surgeon: Danie Binder, MD;  [facility];  Service: Endoscopy;  Laterality: N/A;  0830    FLEXIBLE SIGMOIDOSCOPY N/A 11/24/2020   Procedure: FLEXIBLE SIGMOIDOSCOPY;  Surgeon: Eloise Harman, DO;  [facility];  Service: Endoscopy;  Laterality: N/A;  10:30am   HARDWARE REMOVAL N/A 09/03/2016   Procedure: Removal of Lumbar five-Sacrum one  hardware with Metrex;  Surgeon: Kristeen Miss, MD;  Location: [facility];  Service: Neurosurgery;  Laterality: N/A;   KNEE SURGERY Bilateral    2 on righ-1 scope and one open; and one on left   MOUTH SURGERY  07/21/2013  open colectomy      OVARIAN CYST REMOVAL     POLYPECTOMY  11/24/2020   Procedure: POLYPECTOMY;  Surgeon: Eloise Harman, DO;  [facility];  Service: Endoscopy;;   SAVORY DILATION N/A 01/21/2017   Procedure: SAVORY DILATION;  Surgeon: Danie Binder, MD;  [facility];  Service: Endoscopy;  Laterality: N/A;   SMART PILL PROCEDURE N/A 07/04/2014   Procedure: SMART PILL PROCEDURE;  Surgeon: Danie Binder, MD;  [facility];  Service: Endoscopy;  Laterality: N/A;  800   TOTAL SHOULDER ARTHROPLASTY Right  12/14/2020   Procedure: TOTAL SHOULDER ARTHROPLASTY;  Surgeon: Justice Britain, MD;  [facility];  Service: Orthopedics;  Laterality: Right;  18mn   TUBAL LIGATION     Patient Active Problem List   Diagnosis Date Noted   Status post total shoulder arthroplasty, right 12/14/2020   Nausea without vomiting 10/19/2020   AKI (acute kidney injury) ([facility] 09/28/2020   Intractable vomiting 09/28/2020   Hypercalcemia 07/27/2019   RUQ abdominal pain 01/21/2019   Paresthesias/numbness 05/06/2018   Bloating 12/17/2017   Constipation 09/09/2017   Lumbar pseudoarthrosis 09/03/2016   Dysphagia 05/31/2015   ARF (acute renal failure) ([facility] 09/03/2014   UTI (urinary tract infection) 09/03/2014   Gastroparesis 07/15/2014   GERD (gastroesophageal reflux disease) 06/29/2014   Serrated adenoma of colon 01/17/2011   Irritable bowel syndrome 04/12/2010    PCP: SMonico Blitz REFERRING PROVIDER: DLennice SitesDr TLovena Lenext apt after therapy 06/19/22  REFERRING DIAG:  Diagnosis  N62 (ICD-10-CM) - Breast hypertrophy    Rationale for Evaluation and Treatment: Rehabilitation  THERAPY DIAG:  Pain in thoracic spine  ONSET DATE: chronic  SUBJECTIVE:                                                                                                                                                                                           SUBJECTIVE STATEMENT: 05/21/22:  Reports pain in lower and upper back, pain scale 6/10 tingling at times but constant soreness.  Has began HEP daily without questions.    Eval subjective: Pt states that she has had two low back surgeries the last being March of 2018 but now she is having pain between her shoulder blades.  She wants to have a breast reduction but can not until she has 6 weeks of therapy. She states swimming in her pool helps her pain.   PERTINENT HISTORY: 2 Low back surgeries,  Rt Total shoulder replacement, pt has a daily aide.   PAIN:  Are you  having pain? Yes: NPRS scale: 6/10 upper and lower back. Pain  location: between shoulder blades  Pain description: sharp burning  Aggravating factors: being upright  Relieving factors: heat and pain meds   PRECAUTIONS: None  WEIGHT BEARING RESTRICTIONS: No  FALLS:  Has patient fallen in last 6 months? No  LIVING ENVIRONMENT: Lives with: lives with their family Lives in: House/apartment OCCUPATION: unemployed   PLOF: Needs assistance with homemaking  PATIENT GOALS: less pain  NEXT MD VISIT: after therapy   OBJECTIVE:   DIAGNOSTIC FINDINGS:  None   PATIENT SURVEYS:  FOTO 37   COGNITION: Overall cognitive status: Within functional limits for tasks assessed     SENSATION: WFL   POSTURE: rounded shoulders, decreased lumbar lordosis, and increased thoracic kyphosis  UE AROM: WFL but has significant increased pain and moves very slowly UE STrength overall for flexion/abduction, ER, horizontal abduction and adduction  are all 3+/5    TODAY'S TREATMENT:                                                                                                                              DATE:  05/21/22: Reviewed goals, educated importance of HEP compliance for maximal benefits Pt able to recall seated posture and ER, cueing to improve mechanics with seated scapular retraction  Supine: Deep breathing, pair exhalation with abdominal set x 3 min Marching 10x 3" with ab set Decompression with RTB 5x each with ab set   Seated Seated posture 3D thoracic excursion 5x each direction Abdominal sets 10x 5"  Standing:  RTB rows 10x RTB  shoulder extension 10x  05/02/2022  Holding good posture for 10 seconds x 5 Scapular retraction x 10 B ER x 10    PATIENT EDUCATION:  Education details: The importance of good posture; HEp  Person educated: Patient Education method: Explanation Education comprehension: verbalized understanding and returned demonstration  HOME EXERCISE  PROGRAM: Access Code: [email or web address] URL: [email or web address] Date: 05/02/2022 Prepared by: Rayetta Humphrey  Exercises - Correct Seated Posture  - 3 x daily - 7 x weekly - 1 sets - 5-10 reps - 3-5 seconds  hold - Seated Scapular Retraction  - 3 x daily - 7 x weekly - 1 sets - 10 reps - 3-5 seconds  hold - Seated Shoulder External Rotation  - 3 x daily - 7 x weekly - 1 sets - 10 reps - 3-5 seconds  hold  05/21/22: Abdominal sets, Decompression with RTB, postural red theraband standing row and shoulder extension  ASSESSMENT:  CLINICAL IMPRESSION: Reviewed goals, educated importance of HEP compliance for maximal benefits, pt able to recall seated posture and ER exercise, min cueing to improve mechanics with scapular retraction.  Session focus with core and postural strengthening.  Cueing to improve breathing through all abdominal strengthening exercises.  Pt able to demonstrate good mechanics with decompression exercises and recalls standing postural theraband exercises from past shoulder surgery.  Added to HEP with printout given.  No reports of increased pain through session.    OBJECTIVE IMPAIRMENTS:  decreased strength, impaired UE functional use, postural dysfunction, obesity, and pain.   ACTIVITY LIMITATIONS: carrying, lifting, standing, and reach over head  PARTICIPATION LIMITATIONS:  Pt has an aide who does all housework pt basically does not physical activity due to low back issues.   PERSONAL FACTORS: Behavior pattern, Fitness, Time since onset of injury/illness/exacerbation, and 1-2 comorbidities: 2 previous back surgeries, past hernia repair whic pt states MD stated not to lift anything as she might get another hernia   are also affecting patient's functional outcome.   REHAB POTENTIAL: Fair    CLINICAL DECISION MAKING: Stable/uncomplicated  EVALUATION COMPLEXITY: Low   GOALS: Goals reviewed with patient? No  SHORT TERM GOALS: Target date: 06/11/2022  Pt to be  I in a HEP to decrease pain to 5/10 Baseline: Goal status: IN PROGRESS  2.  Pt to be increase core strength by 1//2 grade to allow pt to be able to put dishes up without increased pain.  Baseline:  Goal status: IN PROGRESS     LONG TERM GOALS: Target date: 07/02/2022  Pt to be I in advance HEP to allow thoracic pain to decrease to no greater than a 3/10 Baseline:  Goal status: IN PROGRESS  2.  Pt to demonstrate good posture.  Baseline:  Goal status: IN PROGRESS  3.  PT core strength to be increased one grade to allow pt to be able to fold clothes without increased pain.  Baseline:  Goal status: IN PROGRESS    PLAN:  PT FREQUENCY: 2x/week  PT DURATION: 6 weeks  PLANNED INTERVENTIONS: Therapeutic exercises, Therapeutic activity, Patient/Family education, Self Care, and Manual therapy.  PLAN FOR NEXT SESSION: Thoracic excursions, theraband postural exercises; decompression theraband exercises, abdominal set, wall push ups.....  Ihor Austin, LPTA/CLT; Delana Meyer [phone]   05/21/2022

## 2022-05-23 ENCOUNTER — Ambulatory Visit (HOSPITAL_COMMUNITY): Payer: Medicaid Other | Admitting: Physical Therapy

## 2022-05-23 DIAGNOSIS — M546 Pain in thoracic spine: Secondary | ICD-10-CM

## 2022-05-23 NOTE — Therapy (Signed)
OUTPATIENT PHYSICAL THERAPY THORACOLUMBAR EVALUATION   Patient Name: Alicia Tyler MRN: [id number] DOB:[birth year], 51 y.o., female Today's Date: 05/23/2022  PT End of Session - 05/23/22 1341    Visit Number 3    Number of Visits 6    Date for PT Re-Evaluation 05/23/22    Authorization Type [place] medicaid healthy blue- request 6 visits    Authorization Time Period 9 visits approved from 11/13-->07/04/22    Authorization - Visit Number 3    Authorization - Number of Visits 9    Progress Note Due on Visit 7    PT Start Time 1301    PT Stop Time 1340    PT Time Calculation (min) 39 min    Activity Tolerance Patient tolerated treatment well    Behavior During Therapy WFL for tasks assessed/performed              Past Medical History:  Diagnosis Date   Anxiety    BMI (body mass index) 20.0-29.01 Apr 2010 172 LBS   Chronic back pain    Degenerative disc disease, thoracic    Gastroparesis    GERD (gastroesophageal reflux disease)    Headache(784.0)    IBS (irritable bowel syndrome)    Incontinence of feces    WEARS DEPENDS AT HS     Neuropathy    PONV (postoperative nausea and vomiting)    Pseudoobstruction of colon 08/24/2014   Seasonal allergies    Serrated adenoma of colon 05/2010   Past Surgical History:  Procedure Laterality Date   ABLATION     endometrial ablation   APPENDECTOMY     BACK SURGERY  05/14/2011   Lumbar Fusion and cage   BIOPSY  06/22/2015   Procedure: BIOPSY;  Surgeon: Danie Binder, MD;  [facility];  Service: Endoscopy;;  gastric and esophageal bx's   BREAST LUMPECTOMY     from the left breast 2003,  2016   CHOLECYSTECTOMY     COLECTOMY N/A 08/24/2014   Procedure: TOTAL COLECTOMY;  Surgeon: Jamesetta So, MD;  [facility];  Service: General;  Laterality: N/A;   COLON SURGERY     COLONOSCOPY  NOV 2011 SCREENING    HYPERPLASTIC RECTAL POLYP, SML IH, incomplete due to discomfort   COLONOSCOPY  DEC 2011 w/ PROPOFOL   SERRATED  ADENOMA, HYPERPLASTIC POLY[S[   ESOPHAGEAL DILATION     ESOPHAGEAL MANOMETRY N/A 12/29/2017   Procedure: ESOPHAGEAL MANOMETRY (EM);  Surgeon: Mauri Pole, MD;  [facility];  Service: Endoscopy;  Laterality: N/A;   ESOPHAGOGASTRODUODENOSCOPY (EGD) WITH PROPOFOL N/A 06/22/2015   Procedure: ESOPHAGOGASTRODUODENOSCOPY (EGD) WITH PROPOFOL;  Surgeon: Danie Binder, MD;  [facility];  Service: Endoscopy;  Laterality: N/A;   ESOPHAGOGASTRODUODENOSCOPY (EGD) WITH PROPOFOL N/A 01/21/2017   Procedure: ESOPHAGOGASTRODUODENOSCOPY (EGD) WITH PROPOFOL;  Surgeon: Danie Binder, MD;  [facility];  Service: Endoscopy;  Laterality: N/A;  9:30am   FLEXIBLE SIGMOIDOSCOPY N/A 06/22/2015   Procedure: FLEXIBLE SIGMOIDOSCOPY WITH PROPOFOL;  Surgeon: Danie Binder, MD;  [facility];  Service: Endoscopy;  Laterality: N/A;  0830    FLEXIBLE SIGMOIDOSCOPY N/A 11/24/2020   Procedure: FLEXIBLE SIGMOIDOSCOPY;  Surgeon: Eloise Harman, DO;  [facility];  Service: Endoscopy;  Laterality: N/A;  10:30am   HARDWARE REMOVAL N/A 09/03/2016   Procedure: Removal of Lumbar five-Sacrum one  hardware with Metrex;  Surgeon: Kristeen Miss, MD;  Location: [facility];  Service: Neurosurgery;  Laterality: N/A;  KNEE SURGERY Bilateral    2 on righ-1 scope and one open; and one on left   MOUTH SURGERY  07/21/2013   open colectomy      OVARIAN CYST REMOVAL     POLYPECTOMY  11/24/2020   Procedure: POLYPECTOMY;  Surgeon: Eloise Harman, DO;  [facility];  Service: Endoscopy;;   SAVORY DILATION N/A 01/21/2017   Procedure: SAVORY DILATION;  Surgeon: Danie Binder, MD;  [facility];  Service: Endoscopy;  Laterality: N/A;   SMART PILL PROCEDURE N/A 07/04/2014   Procedure: SMART PILL PROCEDURE;  Surgeon: Danie Binder, MD;  [facility];  Service: Endoscopy;  Laterality: N/A;  800   TOTAL SHOULDER ARTHROPLASTY Right 12/14/2020   Procedure:  TOTAL SHOULDER ARTHROPLASTY;  Surgeon: Justice Britain, MD;  [facility];  Service: Orthopedics;  Laterality: Right;  151mn   TUBAL LIGATION     Patient Active Problem List   Diagnosis Date Noted   Status post total shoulder arthroplasty, right 12/14/2020   Nausea without vomiting 10/19/2020   AKI (acute kidney injury) ([facility] 09/28/2020   Intractable vomiting 09/28/2020   Hypercalcemia 07/27/2019   RUQ abdominal pain 01/21/2019   Paresthesias/numbness 05/06/2018   Bloating 12/17/2017   Constipation 09/09/2017   Lumbar pseudoarthrosis 09/03/2016   Dysphagia 05/31/2015   ARF (acute renal failure) ([facility] 09/03/2014   UTI (urinary tract infection) 09/03/2014   Gastroparesis 07/15/2014   GERD (gastroesophageal reflux disease) 06/29/2014   Serrated adenoma of colon 01/17/2011   Irritable bowel syndrome 04/12/2010    PCP: SMonico Blitz REFERRING PROVIDER: DLennice SitesDr TLovena Lenext apt after therapy 06/19/22  REFERRING DIAG:  Diagnosis  N62 (ICD-10-CM) - Breast hypertrophy    Rationale for Evaluation and Treatment: Rehabilitation  THERAPY DIAG:  Pain in thoracic spine  ONSET DATE: chronic  SUBJECTIVE:                                                                                                                                                                                           SUBJECTIVE STATEMENT: Pt states that she continues to complete her exercises.   PERTINENT HISTORY: 2 Low back surgeries,  Rt Total shoulder replacement, pt has a daily aide.   PAIN:  Are you having pain? Yes: NPRS scale: 5/10 upper and lower back. Pain location: between shoulder blades  Pain description: sharp burning  Aggravating factors: being upright  Relieving factors: heat and pain meds    PLOF: Needs assistance with homemaking  PATIENT GOALS: less pain  NEXT MD VISIT: after therapy   OBJECTIVE:     PATIENT SURVEYS:  FOTO 37    POSTURE: rounded shoulders, decreased  lumbar lordosis, and increased thoracic kyphosis  UE AROM: WFL but has significant increased pain and moves very slowly UE STrength overall for flexion/abduction, ER, horizontal abduction and adduction  are all 3+/5    TODAY'S TREATMENT:                                                                                                                              DATE: 05/23/22:  Standing :  wall arch x 10                   Back to wall with B flexion x 10                    Postural exercises using green theraband:  scapular retraction, rows and shoulder extension all x 10 reps  Prone:       Single arm raise B x 10                   W back x 10                    Shoulder extension x 10  Supine:     decompression exercises: Flexion, ER, SASH and horizontal abduction x 10 each. Red theraband                        05/21/22: Reviewed goals, educated importance of HEP compliance for maximal benefits Pt able to recall seated posture and ER, cueing to improve mechanics with seated scapular retraction  Supine: Deep breathing, pair exhalation with abdominal set x 3 min Marching 10x 3" with ab set Decompression with RTB 5x each with ab set   Seated Seated posture 3D thoracic excursion 5x each direction Abdominal sets 10x 5"  Standing:  RTB rows 10x RTB  shoulder extension 10x  05/02/2022  Holding good posture for 10 seconds x 5 Scapular retraction x 10 B ER x 10    PATIENT EDUCATION:  Education details: The importance of good posture; HEp  Person educated: Patient Education method: Explanation Education comprehension: verbalized understanding and returned demonstration  HOME EXERCISE PROGRAM: Access Code: [email or web address] URL: [email or web address] Date: 05/02/2022 Prepared by: Rayetta Humphrey  Exercises - Correct Seated Posture  - 3 x daily - 7 x weekly - 1 sets - 5-10 reps - 3-5 seconds  hold - Seated Scapular Retraction  - 3 x daily - 7 x weekly - 1 sets - 10  reps - 3-5 seconds  hold - Seated Shoulder External Rotation  - 3 x daily - 7 x weekly - 1 sets - 10 reps - 3-5 seconds  hold  05/21/22: Abdominal sets, Decompression with RTB, postural red theraband standing row and shoulder extension  ASSESSMENT:  CLINICAL IMPRESSION:   Session focus with core and postural strengthening.  Cueing to improve breathing through all abdominal strengthening  exercises.  Pt able to demonstrate good mechanics with all exercises with minimal cuing.  Noted decreased pain today.   Therapist added to HEP with printout given.  No reports of increased pain through session.    OBJECTIVE IMPAIRMENTS: decreased strength, impaired UE functional use, postural dysfunction, obesity, and pain.   ACTIVITY LIMITATIONS: carrying, lifting, standing, and reach over head  PARTICIPATION LIMITATIONS:  Pt has an aide who does all housework pt basically does not physical activity due to low back issues.   PERSONAL FACTORS: Behavior pattern, Fitness, Time since onset of injury/illness/exacerbation, and 1-2 comorbidities: 2 previous back surgeries, past hernia repair whic pt states MD stated not to lift anything as she might get another hernia   are also affecting patient's functional outcome.   REHAB POTENTIAL: Fair    CLINICAL DECISION MAKING: Stable/uncomplicated  EVALUATION COMPLEXITY: Low   GOALS: Goals reviewed with patient? No  SHORT TERM GOALS: Target date: 06/13/2022  Pt to be I in a HEP to decrease pain to 5/10 Baseline: Goal status: IN PROGRESS  2.  Pt to be increase core strength by 1//2 grade to allow pt to be able to put dishes up without increased pain.  Baseline:  Goal status: IN PROGRESS     LONG TERM GOALS: Target date: 07/04/2022  Pt to be I in advance HEP to allow thoracic pain to decrease to no greater than a 3/10 Baseline:  Goal status: IN PROGRESS  2.  Pt to demonstrate good posture.  Baseline:  Goal status: IN PROGRESS  3.  PT core strength  to be increased one grade to allow pt to be able to fold clothes without increased pain.  Baseline:  Goal status: IN PROGRESS    PLAN:  PT FREQUENCY: 2x/week  PT DURATION: 6 weeks  PLANNED INTERVENTIONS: Therapeutic exercises, Therapeutic activity, Patient/Family education, Self Care, and Manual therapy.  PLAN FOR NEXT SESSION: Thoracic excursions, theraband postural exercises; decompression theraband exercises, abdominal set, wall push ups.....  Rayetta Humphrey, [place] CLT [phone] 05/23/2022 1343

## 2022-05-28 ENCOUNTER — Encounter (HOSPITAL_COMMUNITY): Payer: Medicaid Other | Admitting: Physical Therapy

## 2022-05-30 ENCOUNTER — Encounter (HOSPITAL_COMMUNITY): Payer: Medicaid Other | Admitting: Physical Therapy

## 2022-06-04 ENCOUNTER — Ambulatory Visit (HOSPITAL_COMMUNITY): Payer: Medicaid Other | Attending: Plastic Surgery | Admitting: Physical Therapy

## 2022-06-04 DIAGNOSIS — M546 Pain in thoracic spine: Secondary | ICD-10-CM | POA: Diagnosis present

## 2022-06-04 NOTE — Therapy (Signed)
OUTPATIENT PHYSICAL THERAPY THORACOLUMBAR EVALUATION   Patient Name: Alicia Tyler MRN: [id number] DOB:26-Oct-1970, 51 y.o., female Today's Date: 06/04/2022  Progress Note Reporting Period 05/02/22 to 06/04/22  See note below for Objective Data and Assessment of Progress/Goals.      END OF SESSION:   PT End of Session - 06/04/22 1351     Visit Number 4    Number of Visits 6    Date for PT Re-Evaluation 06/21/22    Authorization Type [place] medicaid healthy blue- request 6 visits    Authorization Time Period 9 visits approved from 11/13-->07/04/22    Authorization - Visit Number 4    Authorization - Number of Visits 9    Progress Note Due on Visit 6    PT Start Time 1093    PT Stop Time 1424    PT Time Calculation (min) 35 min    Activity Tolerance Patient tolerated treatment well    Behavior During Therapy WFL for tasks assessed/performed             Past Medical History:  Diagnosis Date   Anxiety    BMI (body mass index) 20.0-29.01 Apr 2010 172 LBS   Chronic back pain    Degenerative disc disease, thoracic    Gastroparesis    GERD (gastroesophageal reflux disease)    Headache(784.0)    IBS (irritable bowel syndrome)    Incontinence of feces    WEARS DEPENDS AT HS     Neuropathy    PONV (postoperative nausea and vomiting)    Pseudoobstruction of colon 08/24/2014   Seasonal allergies    Serrated adenoma of colon 05/2010   Past Surgical History:  Procedure Laterality Date   ABLATION     endometrial ablation   APPENDECTOMY     BACK SURGERY  05/14/2011   Lumbar Fusion and cage   BIOPSY  06/22/2015   Procedure: BIOPSY;  Surgeon: Danie Binder, MD;  [facility];  Service: Endoscopy;;  gastric and esophageal bx's   BREAST LUMPECTOMY     from the left breast 2003,  2016   CHOLECYSTECTOMY     COLECTOMY N/A 08/24/2014   Procedure: TOTAL COLECTOMY;  Surgeon: Jamesetta So, MD;  [facility]: [facility];  Service: General;  Laterality: N/A;   COLON SURGERY      COLONOSCOPY  NOV 2011 SCREENING    HYPERPLASTIC RECTAL POLYP, SML IH, incomplete due to discomfort   COLONOSCOPY  DEC 2011 w/ PROPOFOL   SERRATED ADENOMA, HYPERPLASTIC POLY[S[   ESOPHAGEAL DILATION     ESOPHAGEAL MANOMETRY N/A 12/29/2017   Procedure: ESOPHAGEAL MANOMETRY (EM);  Surgeon: Mauri Pole, MD;  [facility];  Service: Endoscopy;  Laterality: N/A;   ESOPHAGOGASTRODUODENOSCOPY (EGD) WITH PROPOFOL N/A 06/22/2015   Procedure: ESOPHAGOGASTRODUODENOSCOPY (EGD) WITH PROPOFOL;  Surgeon: Danie Binder, MD;  [facility];  Service: Endoscopy;  Laterality: N/A;   ESOPHAGOGASTRODUODENOSCOPY (EGD) WITH PROPOFOL N/A 01/21/2017   Procedure: ESOPHAGOGASTRODUODENOSCOPY (EGD) WITH PROPOFOL;  Surgeon: Danie Binder, MD;  [facility];  Service: Endoscopy;  Laterality: N/A;  9:30am   FLEXIBLE SIGMOIDOSCOPY N/A 06/22/2015   Procedure: FLEXIBLE SIGMOIDOSCOPY WITH PROPOFOL;  Surgeon: Danie Binder, MD;  [facility];  Service: Endoscopy;  Laterality: N/A;  0830    FLEXIBLE SIGMOIDOSCOPY N/A 11/24/2020   Procedure: FLEXIBLE SIGMOIDOSCOPY;  Surgeon: Eloise Harman, DO;  [facility];  Service: Endoscopy;  Laterality: N/A;  10:30am   HARDWARE REMOVAL N/A 09/03/2016  Procedure: Removal of Lumbar five-Sacrum one  hardware with Metrex;  Surgeon: Kristeen Miss, MD;  Location: [facility];  Service: Neurosurgery;  Laterality: N/A;   KNEE SURGERY Bilateral    2 on righ-1 scope and one open; and one on left   MOUTH SURGERY  07/21/2013   open colectomy      OVARIAN CYST REMOVAL     POLYPECTOMY  11/24/2020   Procedure: POLYPECTOMY;  Surgeon: Eloise Harman, DO;  [facility];  Service: Endoscopy;;   SAVORY DILATION N/A 01/21/2017   Procedure: SAVORY DILATION;  Surgeon: Danie Binder, MD;  [facility];  Service: Endoscopy;  Laterality: N/A;   SMART PILL PROCEDURE N/A 07/04/2014   Procedure: SMART PILL PROCEDURE;  Surgeon:  Danie Binder, MD;  [facility];  Service: Endoscopy;  Laterality: N/A;  800   TOTAL SHOULDER ARTHROPLASTY Right 12/14/2020   Procedure: TOTAL SHOULDER ARTHROPLASTY;  Surgeon: Justice Britain, MD;  [facility];  Service: Orthopedics;  Laterality: Right;  191mn   TUBAL LIGATION     Patient Active Problem List   Diagnosis Date Noted   Status post total shoulder arthroplasty, right 12/14/2020   Nausea without vomiting 10/19/2020   [place]   Intractable vomiting 09/28/2020   Hypercalcemia 07/27/2019   RUQ abdominal pain 01/21/2019   Paresthesias/numbness 05/06/2018   Bloating 12/17/2017   Constipation 09/09/2017   Lumbar pseudoarthrosis 09/03/2016   Dysphagia 05/31/2015   ARF ([place] renal failure) [place] 09/03/2014   UTI (urinary tract infection) 09/03/2014   Gastroparesis 07/15/2014   GERD (gastroesophageal reflux disease) 06/29/2014   Serrated adenoma of colon 01/17/2011   Irritable bowel syndrome 04/12/2010    PCP: SMonico Blitz REFERRING PROVIDER: DLennice SitesDr TLovena Lenext apt after therapy 06/19/22  REFERRING DIAG:  Diagnosis  N62 (ICD-10-CM) - Breast hypertrophy    Rationale for Evaluation and Treatment: Rehabilitation  THERAPY DIAG:  Pain in thoracic spine - Plan: PT plan of care cert/re-cert  ONSET DATE: chronic  SUBJECTIVE:                                                                                                                                                                                           SUBJECTIVE STATEMENT:  Doing ok. Had a bad week last week and could not make visit. Feels like therapy is coming along so so.      PERTINENT HISTORY: 2 Low back surgeries,  Rt Total shoulder replacement, pt has a daily aide.   PAIN:  Are you having pain? Yes: NPRS scale: 5/10 upper and lower back. Pain  location: between shoulder blades  Pain description: sharp burning  Aggravating factors: being  upright  Relieving factors: heat and pain meds    PLOF: Needs assistance with homemaking  PATIENT GOALS: less pain  NEXT MD VISIT: after therapy   OBJECTIVE:  UPPER EXTREMITY MMT:  MMT Right 06/04/22 Left 06/04/22  Shoulder flexion 4 4  Shoulder extension    Shoulder abduction 4 4  Shoulder adduction    Shoulder internal rotation    Shoulder external rotation 4 4  Middle trapezius    Lower trapezius    Elbow flexion    Elbow extension    Wrist flexion    Wrist extension    Wrist ulnar deviation    Wrist radial deviation    Wrist pronation    Wrist supination    Grip strength (lbs)    (Blank rows = not tested)  Core/ lumbar extension strength: 4/5 able to stand upright from forward flexed position without UE assist  LUMBAR ROM:   AROM 06/04/22  Flexion 25% limited  Extension 75% limited  Right lateral flexion 25% limited  Left lateral flexion 25% limited  Right rotation   Left rotation    (Blank rows = not tested)  PATIENT SURVEYS:  FOTO 37    POSTURE: rounded shoulders, decreased lumbar lordosis, and increased thoracic kyphosis  UE AROM: WFL but has significant increased pain and moves very slowly UE STrength overall for flexion/abduction, ER, horizontal abduction and adduction  are all 3+/5    TODAY'S TREATMENT:                                                                                                                              DATE: 06/04/22 Reassess AROM MMT  GTB rows 2 x 10 Shoulder extension GTB 2 x10 Bilateral shoulder ER GTB 2 x 10   05/23/22:  Standing :  wall arch x 10                   Back to wall with B flexion x 10                    Postural exercises using green theraband:  scapular retraction, rows and shoulder extension all x 10 reps  Prone:       Single arm raise B x 10                   W back x 10                    Shoulder extension x 10  Supine:     decompression exercises: Flexion, ER, SASH and horizontal  abduction x 10 each. Red theraband                        05/21/22: Reviewed goals, educated importance of HEP compliance for maximal benefits Pt able to recall seated posture and  ER, cueing to improve mechanics with seated scapular retraction  Supine: Deep breathing, pair exhalation with abdominal set x 3 min Marching 10x 3" with ab set Decompression with RTB 5x each with ab set   Seated Seated posture 3D thoracic excursion 5x each direction Abdominal sets 10x 5"  Standing:  RTB rows 10x RTB  shoulder extension 10x  PATIENT EDUCATION:  Education details: The importance of good posture; HEp  Person educated: Patient Education method: Explanation Education comprehension: verbalized understanding and returned demonstration  HOME EXERCISE PROGRAM: Access Code: [email or web address] URL: [email or web address] Date: 05/02/2022 Prepared by: Rayetta Humphrey  Exercises - Correct Seated Posture  - 3 x daily - 7 x weekly - 1 sets - 5-10 reps - 3-5 seconds  hold - Seated Scapular Retraction  - 3 x daily - 7 x weekly - 1 sets - 10 reps - 3-5 seconds  hold - Seated Shoulder External Rotation  - 3 x daily - 7 x weekly - 1 sets - 10 reps - 3-5 seconds  hold  05/21/22: Abdominal sets, Decompression with RTB, postural red theraband standing row and shoulder extension  ASSESSMENT:  CLINICAL IMPRESSION:  Patient appears to be making moderate progress to therapy goals. Remains limited by pain. Strength in UE has improved. No evidence of core strength being tested at evaluation but is at least 4/5 base on observed function today. Progressed standing postural strengthening exercise with good return. Patient does note intermittent discomfort in RT shoulder with some exercise due to Hx of TSA. Patient will continue to benefit from skilled therapy services to reduce remaining deficits and improve functional ability.    OBJECTIVE IMPAIRMENTS: decreased strength, impaired UE functional use,  postural dysfunction, obesity, and pain.   ACTIVITY LIMITATIONS: carrying, lifting, standing, and reach over head  PARTICIPATION LIMITATIONS:  Pt has an aide who does all housework pt basically does not physical activity due to low back issues.   PERSONAL FACTORS: Behavior pattern, Fitness, Time since onset of injury/illness/exacerbation, and 1-2 comorbidities: 2 previous back surgeries, past hernia repair whic pt states MD stated not to lift anything as she might get another hernia   are also affecting patient's functional outcome.   REHAB POTENTIAL: Fair    CLINICAL DECISION MAKING: Stable/uncomplicated  EVALUATION COMPLEXITY: Low   GOALS: Goals reviewed with patient? No  SHORT TERM GOALS: Target date: 06/25/2022  Pt to be I in a HEP to decrease pain to 5/10 Baseline: Reports compliance, current pain 5/10 Goal status: MET  2.  Pt to be increase core strength by 1//2 grade to allow pt to be able to put dishes up without increased pain.  Baseline: Current 4/5; no evidence that evaluating therapist tested this, nothing to compare to  Goal status: Deferred    LONG TERM GOALS: Target date: 07/16/2022  Pt to be I in advance HEP to allow thoracic pain to decrease to no greater than a 3/10 Baseline:  Goal status: IN PROGRESS  2.  Pt to demonstrate good posture.  Baseline:  Goal status: IN PROGRESS  3.  PT core strength to be increased one grade to allow pt to be able to fold clothes without increased pain.  Baseline: Current 4/5; no evidence that evaluating therapist tested this, nothing to compare to  Goal status: Deferred    PLAN:  PT FREQUENCY: 2x/week  PT DURATION: 6 weeks  PLANNED INTERVENTIONS: Therapeutic exercises, Therapeutic activity, Patient/Family education, Self Care, and Manual therapy.  PLAN FOR NEXT SESSION: Thoracic excursions, theraband postural exercises; decompression  theraband exercises, abdominal set, wall push ups.   2:21 PM, 06/04/22 Josue Hector PT DPT  Physical Therapist with [facility]  [phone]

## 2022-06-06 ENCOUNTER — Ambulatory Visit (HOSPITAL_COMMUNITY): Payer: Medicaid Other | Admitting: Physical Therapy

## 2022-06-06 DIAGNOSIS — M546 Pain in thoracic spine: Secondary | ICD-10-CM

## 2022-06-06 NOTE — Therapy (Signed)
OUTPATIENT PHYSICAL THERAPY THORACOLUMBAR EVALUATION   Patient Name: Alicia Tyler MRN: [id number] DOB:[birth year], 51 y.o., female Today's Date: 06/06/2022   See note below for Objective Data and Assessment of Progress/Goals.      END OF SESSION:   PT End of Session - 06/06/22 1311     Visit Number 5    Number of Visits 6    Date for PT Re-Evaluation 06/21/22    Authorization Type [place] medicaid healthy blue- request 6 visits    Authorization Time Period 9 visits approved from 11/13-->07/04/22    Authorization - Visit Number 5    Authorization - Number of Visits 9    Progress Note Due on Visit 6    PT Start Time 1157    PT Stop Time 1346    PT Time Calculation (min) 38 min    Activity Tolerance Patient tolerated treatment well    Behavior During Therapy WFL for tasks assessed/performed             Past Medical History:  Diagnosis Date   Anxiety    BMI (body mass index) 20.0-29.01 Apr 2010 172 LBS   Chronic back pain    Degenerative disc disease, thoracic    Gastroparesis    GERD (gastroesophageal reflux disease)    Headache(784.0)    IBS (irritable bowel syndrome)    Incontinence of feces    WEARS DEPENDS AT HS     Neuropathy    PONV (postoperative nausea and vomiting)    Pseudoobstruction of colon 08/24/2014   Seasonal allergies    Serrated adenoma of colon 05/2010   Past Surgical History:  Procedure Laterality Date   ABLATION     endometrial ablation   APPENDECTOMY     BACK SURGERY  05/14/2011   Lumbar Fusion and cage   BIOPSY  06/22/2015   Procedure: BIOPSY;  Surgeon: Danie Binder, MD;  [facility];  Service: Endoscopy;;  gastric and esophageal bx's   BREAST LUMPECTOMY     from the left breast 2003,  2016   CHOLECYSTECTOMY     COLECTOMY N/A 08/24/2014   Procedure: TOTAL COLECTOMY;  Surgeon: Jamesetta So, MD;  [facility];  Service: General;  Laterality: N/A;   COLON SURGERY     COLONOSCOPY  NOV 2011 SCREENING    HYPERPLASTIC  RECTAL POLYP, SML IH, incomplete due to discomfort   COLONOSCOPY  DEC 2011 w/ PROPOFOL   SERRATED ADENOMA, HYPERPLASTIC POLY[S[   ESOPHAGEAL DILATION     ESOPHAGEAL MANOMETRY N/A 12/29/2017   Procedure: ESOPHAGEAL MANOMETRY (EM);  Surgeon: Mauri Pole, MD;  [facility];  Service: Endoscopy;  Laterality: N/A;   ESOPHAGOGASTRODUODENOSCOPY (EGD) WITH PROPOFOL N/A 06/22/2015   Procedure: ESOPHAGOGASTRODUODENOSCOPY (EGD) WITH PROPOFOL;  Surgeon: Danie Binder, MD;  [facility];  Service: Endoscopy;  Laterality: N/A;   ESOPHAGOGASTRODUODENOSCOPY (EGD) WITH PROPOFOL N/A 01/21/2017   Procedure: ESOPHAGOGASTRODUODENOSCOPY (EGD) WITH PROPOFOL;  Surgeon: Danie Binder, MD;  [facility];  Service: Endoscopy;  Laterality: N/A;  9:30am   FLEXIBLE SIGMOIDOSCOPY N/A 06/22/2015   Procedure: FLEXIBLE SIGMOIDOSCOPY WITH PROPOFOL;  Surgeon: Danie Binder, MD;  [facility];  Service: Endoscopy;  Laterality: N/A;  0830    FLEXIBLE SIGMOIDOSCOPY N/A 11/24/2020   Procedure: FLEXIBLE SIGMOIDOSCOPY;  Surgeon: Eloise Harman, DO;  [facility];  Service: Endoscopy;  Laterality: N/A;  10:30am   HARDWARE REMOVAL N/A 09/03/2016   Procedure: Removal of Lumbar five-Sacrum one  hardware with Metrex;  Surgeon: Kristeen Miss, MD;  Location: [facility];  Service: Neurosurgery;  Laterality: N/A;   KNEE SURGERY Bilateral    2 on righ-1 scope and one open; and one on left   MOUTH SURGERY  07/21/2013   open colectomy      OVARIAN CYST REMOVAL     POLYPECTOMY  11/24/2020   Procedure: POLYPECTOMY;  Surgeon: Eloise Harman, DO;  [facility];  Service: Endoscopy;;   SAVORY DILATION N/A 01/21/2017   Procedure: SAVORY DILATION;  Surgeon: Danie Binder, MD;  [facility];  Service: Endoscopy;  Laterality: N/A;   SMART PILL PROCEDURE N/A 07/04/2014   Procedure: SMART PILL PROCEDURE;  Surgeon: Danie Binder, MD;  [facility];   Service: Endoscopy;  Laterality: N/A;  800   TOTAL SHOULDER ARTHROPLASTY Right 12/14/2020   Procedure: TOTAL SHOULDER ARTHROPLASTY;  Surgeon: Justice Britain, MD;  [facility];  Service: Orthopedics;  Laterality: Right;  196mn   TUBAL LIGATION     Patient Active Problem List   Diagnosis Date Noted   Status post total shoulder arthroplasty, right 12/14/2020   Nausea without vomiting 10/19/2020   AKI ([place] injury) [place] 09/28/[place]   Intractable vomiting 09/28/2020   Hypercalcemia 07/27/2019   RUQ abdominal pain 01/21/2019   Paresthesias/numbness 05/06/2018   Bloating 12/17/2017   Constipation 09/09/2017   Lumbar pseudoarthrosis 09/03/2016   Dysphagia 05/31/2015   ARF (acute renal failure) ([place] 09/03/2014   UTI (urinary tract infection) 09/03/2014   Gastroparesis 07/15/2014   GERD (gastroesophageal reflux disease) 06/29/2014   Serrated adenoma of colon 01/17/2011   Irritable bowel syndrome 04/12/2010    PCP: SMonico Blitz REFERRING PROVIDER: DLennice SitesDr TLovena Lenext apt after therapy 06/19/22  REFERRING DIAG:  Diagnosis  N62 (ICD-10-CM) - Breast hypertrophy    Rationale for Evaluation and Treatment: Rehabilitation  THERAPY DIAG:  Pain in thoracic spine  ONSET DATE: chronic  SUBJECTIVE:                                                                                                                                                                                           SUBJECTIVE STATEMENT:  Back is a little more sore today. Was walking fast to get here today so back is tight now.    PERTINENT HISTORY: 2 Low back surgeries,  Rt Total shoulder replacement, pt has a daily aide.   PAIN:  Are you having pain? Yes: NPRS scale: 6/10 upper and lower back. Pain location: between shoulder blades  Pain description: sharp burning  Aggravating factors: being upright  Relieving factors: heat  and pain meds    PLOF: Needs assistance with homemaking  PATIENT  GOALS: less pain  NEXT MD VISIT: after therapy   OBJECTIVE:  UPPER EXTREMITY MMT:  MMT Right 06/04/22 Left 06/04/22  Shoulder flexion 4 4  Shoulder extension    Shoulder abduction 4 4  Shoulder adduction    Shoulder internal rotation    Shoulder external rotation 4 4  Middle trapezius    Lower trapezius    Elbow flexion    Elbow extension    Wrist flexion    Wrist extension    Wrist ulnar deviation    Wrist radial deviation    Wrist pronation    Wrist supination    Grip strength (lbs)    (Blank rows = not tested)  Core/ lumbar extension strength: 4/5 able to stand upright from forward flexed position without UE assist  LUMBAR ROM:   AROM 06/04/22  Flexion 25% limited  Extension 75% limited  Right lateral flexion 25% limited  Left lateral flexion 25% limited  Right rotation   Left rotation    (Blank rows = not tested)  PATIENT SURVEYS:  FOTO 37    POSTURE: rounded shoulders, decreased lumbar lordosis, and increased thoracic kyphosis  UE AROM: WFL but has significant increased pain and moves very slowly UE STrength overall for flexion/abduction, ER, horizontal abduction and adduction  are all 3+/5    TODAY'S TREATMENT:                                                                                                                              DATE: 06/06/22 Band rows GTB 2 x 10  Shoulder extension GTB 2 x10 Bilateral shoulder ER GTB 2 x 10   Pec stretch at wall 3 x 20" Thoracic extension seated 15 x 3" Seated UT stretch 3 x 20"  Standing shoulder flexion/ scaption 1# 2 x 10 each   06/04/22 Reassess AROM MMT  GTB rows 2 x 10 Shoulder extension GTB 2 x10 Bilateral shoulder ER GTB 2 x 10   05/23/22:  Standing :  wall arch x 10                   Back to wall with B flexion x 10                    Postural exercises using green theraband:  scapular retraction, rows and shoulder extension all x 10 reps  Prone:       Single arm raise B x 10                    W back x 10                    Shoulder extension x 10  Supine:     decompression exercises: Flexion, ER, SASH and horizontal abduction x 10 each. Red theraband   05/21/22: Reviewed goals, educated  importance of HEP compliance for maximal benefits Pt able to recall seated posture and ER, cueing to improve mechanics with seated scapular retraction  Supine: Deep breathing, pair exhalation with abdominal set x 3 min Marching 10x 3" with ab set Decompression with RTB 5x each with ab set   Seated Seated posture 3D thoracic excursion 5x each direction Abdominal sets 10x 5"  Standing:  RTB rows 10x RTB  shoulder extension 10x  PATIENT EDUCATION:  Education details: The importance of good posture; HEP Person educated: Patient Education method: Explanation Education comprehension: verbalized understanding and returned demonstration  HOME EXERCISE PROGRAM: Access Code: [email or web address] URL: [email or web address]  06/06/22 - Seated Upper Trapezius Stretch  - 1-2 x daily - 7 x weekly - 1 sets - 3 reps - 30 second hold - Doorway Pec Stretch at 60 Degrees Abduction with Arm Straight  - 1-2 x daily - 7 x weekly - 1 sets - 3 reps - 30 second hold - Seated Thoracic Lumbar Extension  - 1-2 x daily - 7 x weekly - 1 sets - 10 reps - 3 second hold  Date: 05/02/2022 Prepared by: Rayetta Humphrey  Exercises - Correct Seated Posture  - 3 x daily - 7 x weekly - 1 sets - 5-10 reps - 3-5 seconds  hold - Seated Scapular Retraction  - 3 x daily - 7 x weekly - 1 sets - 10 reps - 3-5 seconds  hold - Seated Shoulder External Rotation  - 3 x daily - 7 x weekly - 1 sets - 10 reps - 3-5 seconds  hold  05/21/22: Abdominal sets, Decompression with RTB, postural red theraband standing row and shoulder extension  ASSESSMENT:  CLINICAL IMPRESSION: Patient well challenged with additions today. Added pec and thoracic spine stretches for improved posturing. Patient educated on proper form and  function of all added exercise. Modified RT arm position during pec stretch and shoulder scaption for improved comfort. Updated HEP and issued handout. Patient will continue to benefit from skilled therapy services to reduce remaining deficits and improve functional ability.   OBJECTIVE IMPAIRMENTS: decreased strength, impaired UE functional use, postural dysfunction, obesity, and pain.   ACTIVITY LIMITATIONS: carrying, lifting, standing, and reach over head  PARTICIPATION LIMITATIONS:  Pt has an aide who does all housework pt basically does not physical activity due to low back issues.   PERSONAL FACTORS: Behavior pattern, Fitness, Time since onset of injury/illness/exacerbation, and 1-2 comorbidities: 2 previous back surgeries, past hernia repair whic pt states MD stated not to lift anything as she might get another hernia   are also affecting patient's functional outcome.   REHAB POTENTIAL: Fair    CLINICAL DECISION MAKING: Stable/uncomplicated  EVALUATION COMPLEXITY: Low   GOALS: Goals reviewed with patient? No  SHORT TERM GOALS: Target date: 06/27/2022  Pt to be I in a HEP to decrease pain to 5/10 Baseline: Reports compliance, current pain 5/10 Goal status: MET  2.  Pt to be increase core strength by 1//2 grade to allow pt to be able to put dishes up without increased pain.  Baseline: Current 4/5; no evidence that evaluating therapist tested this, nothing to compare to  Goal status: Deferred    LONG TERM GOALS: Target date: 07/18/2022  Pt to be I in advance HEP to allow thoracic pain to decrease to no greater than a 3/10 Baseline:  Goal status: IN PROGRESS  2.  Pt to demonstrate good posture.  Baseline:  Goal status: IN PROGRESS  3.  PT core strength  to be increased one grade to allow pt to be able to fold clothes without increased pain.  Baseline: Current 4/5; no evidence that evaluating therapist tested this, nothing to compare to  Goal status:  Deferred    PLAN:  PT FREQUENCY: 2x/week  PT DURATION: 6 weeks  PLANNED INTERVENTIONS: Therapeutic exercises, Therapeutic activity, Patient/Family education, Self Care, and Manual therapy.  PLAN FOR NEXT SESSION: Continue to progress postural strengthening as tolerated.    1:12 PM, 06/06/22 Josue Hector PT DPT  Physical Therapist with [facility]  [phone]

## 2022-06-11 ENCOUNTER — Encounter (HOSPITAL_COMMUNITY): Payer: Self-pay

## 2022-06-11 ENCOUNTER — Ambulatory Visit (HOSPITAL_COMMUNITY): Payer: Medicaid Other

## 2022-06-11 DIAGNOSIS — M546 Pain in thoracic spine: Secondary | ICD-10-CM | POA: Diagnosis not present

## 2022-06-11 NOTE — Patient Instructions (Signed)
Abduction (Active)  Abduction: Side Leg Lift (Eccentric) - Side-Lying    Lie on side. Lift top leg slightly higher than shoulder level. Keep top leg straight with body, toes pointing forward. Slowly lower for 3-5 seconds. ___ reps per set, ___ sets per day, ___ days per week. Add ___ lbs when you achieve ___ repetitions.   http://ecce.exer.us/63   Copyright  VHI. All rights reserved.       Bridge    Lie back, legs bent. Inhale, pressing hips up. Keeping ribs in, lengthen lower back. Exhale, rolling down along spine from top. Repeat ____ times. Do ____ sessions per day.  http://pm.exer.us/55   Copyright  VHI. All rights reserved.

## 2022-06-11 NOTE — Therapy (Signed)
OUTPATIENT PHYSICAL THERAPY THORACOLUMBAR TREATMENT   Patient Name: Alicia Tyler MRN: [id number] DOB:04/13/1971, 51 y.o., female Today's Date: 06/11/2022   PHYSICAL THERAPY DISCHARGE SUMMARY  Visits from Start of Care: 6  Current functional level related to goals / functional outcomes: See below   Remaining deficits: See below   Education / Equipment: HEP   Patient agrees to discharge. Patient goals were partially met. Patient is being discharged due to the patient's request.  END OF SESSION:   PT End of Session - 06/11/22 1356     Visit Number 6    Number of Visits 6    Date for PT Re-Evaluation 06/21/22    Authorization Type [place] medicaid healthy blue- request 6 visits    Authorization Time Period 9 visits approved from 11/13-->07/04/22    Authorization - Visit Number 6    Authorization - Number of Visits 9    Progress Note Due on Visit 6    PT Start Time 6237    PT Stop Time 1344    PT Time Calculation (min) 42 min    Activity Tolerance Patient tolerated treatment well;Patient limited by pain    Behavior During Therapy WFL for tasks assessed/performed              Past Medical History:  Diagnosis Date   Anxiety    BMI (body mass index) 20.0-29.01 Apr 2010 172 LBS   Chronic back pain    Degenerative disc disease, thoracic    Gastroparesis    GERD (gastroesophageal reflux disease)    Headache(784.0)    IBS (irritable bowel syndrome)    Incontinence of feces    WEARS DEPENDS AT HS     Neuropathy    PONV (postoperative nausea and vomiting)    Pseudoobstruction of colon 08/24/2014   Seasonal allergies    Serrated adenoma of colon 05/2010   Past Surgical History:  Procedure Laterality Date   ABLATION     endometrial ablation   APPENDECTOMY     BACK SURGERY  05/14/2011   Lumbar Fusion and cage   BIOPSY  06/22/2015   Procedure: BIOPSY;  Surgeon: Danie Binder, MD;  [facility];  Service: Endoscopy;;  gastric and esophageal bx's   BREAST  LUMPECTOMY     from the left breast 2003,  2016   CHOLECYSTECTOMY     COLECTOMY N/A 08/24/2014   Procedure: TOTAL COLECTOMY;  Surgeon: Jamesetta So, MD;  [facility]: [facility];  Service: General;  Laterality: N/A;   COLON SURGERY     COLONOSCOPY  NOV 2011 SCREENING    HYPERPLASTIC RECTAL POLYP, SML IH, incomplete due to discomfort   COLONOSCOPY  DEC 2011 w/ PROPOFOL   SERRATED ADENOMA, HYPERPLASTIC POLY[S[   ESOPHAGEAL DILATION     ESOPHAGEAL MANOMETRY N/A 12/29/2017   Procedure: ESOPHAGEAL MANOMETRY (EM);  Surgeon: Mauri Pole, MD;  [facility];  Service: Endoscopy;  Laterality: N/A;   ESOPHAGOGASTRODUODENOSCOPY (EGD) WITH PROPOFOL N/A 06/22/2015   Procedure: ESOPHAGOGASTRODUODENOSCOPY (EGD) WITH PROPOFOL;  Surgeon: Danie Binder, MD;  [facility];  Service: Endoscopy;  Laterality: N/A;   ESOPHAGOGASTRODUODENOSCOPY (EGD) WITH PROPOFOL N/A 01/21/2017   Procedure: ESOPHAGOGASTRODUODENOSCOPY (EGD) WITH PROPOFOL;  Surgeon: Danie Binder, MD;  [facility];  Service: Endoscopy;  Laterality: N/A;  9:30am   FLEXIBLE SIGMOIDOSCOPY N/A 06/22/2015   Procedure: FLEXIBLE SIGMOIDOSCOPY WITH PROPOFOL;  Surgeon: Danie Binder, MD;  [facility];  Service: Endoscopy;  Laterality: N/A;  0830  FLEXIBLE SIGMOIDOSCOPY N/A 11/24/2020   Procedure: FLEXIBLE SIGMOIDOSCOPY;  Surgeon: Eloise Harman, DO;  [facility];  Service: Endoscopy;  Laterality: N/A;  10:30am   HARDWARE REMOVAL N/A 09/03/2016   Procedure: Removal of Lumbar five-Sacrum one  hardware with Metrex;  Surgeon: Kristeen Miss, MD;  Location: [employer];  Service: Neurosurgery;  Laterality: N/A;   KNEE SURGERY Bilateral    2 on righ-1 scope and one open; and one on left   MOUTH SURGERY  07/21/2013   open colectomy      OVARIAN CYST REMOVAL     POLYPECTOMY  11/24/2020   Procedure: POLYPECTOMY;  Surgeon: Eloise Harman, DO;  [facility];  Service: Endoscopy;;   SAVORY  DILATION N/A 01/21/2017   Procedure: SAVORY DILATION;  Surgeon: Danie Binder, MD;  [facility];  Service: Endoscopy;  Laterality: N/A;   SMART PILL PROCEDURE N/A 07/04/2014   Procedure: SMART PILL PROCEDURE;  Surgeon: Danie Binder, MD;  [facility];  Service: Endoscopy;  Laterality: N/A;  800   TOTAL SHOULDER ARTHROPLASTY Right 12/14/2020   Procedure: TOTAL SHOULDER ARTHROPLASTY;  Surgeon: Justice Britain, MD;  [facility];  Service: Orthopedics;  Laterality: Right;  140mn   TUBAL LIGATION     Patient Active Problem List   Diagnosis Date Noted   Status post total shoulder arthroplasty, right 12/14/2020   Nausea without vomiting 10/19/2020   AKI (acute kidney injury) ([facility] 09/28/2020   Intractable vomiting 09/28/2020   Hypercalcemia 07/27/2019   RUQ abdominal pain 01/21/2019   Paresthesias/numbness 05/06/2018   Bloating 12/17/2017   Constipation 09/09/2017   Lumbar pseudoarthrosis 09/03/2016   Dysphagia 05/31/2015   ARF (acute renal failure) ([facility] 09/03/2014   UTI (urinary tract infection) 09/03/2014   Gastroparesis 07/15/2014   GERD (gastroesophageal reflux disease) 06/29/2014   Serrated adenoma of colon 01/17/2011   Irritable bowel syndrome 04/12/2010    PCP: SMonico Blitz REFERRING PROVIDER: DLennice SitesDr TLovena Lenext apt after therapy 06/19/22  REFERRING DIAG:  Diagnosis  N62 (ICD-10-CM) - Breast hypertrophy    Rationale for Evaluation and Treatment: Rehabilitation  THERAPY DIAG:  Pain in thoracic spine  ONSET DATE: chronic  SUBJECTIVE:                                                                                                                                                                                           SUBJECTIVE STATEMENT: Pt continues to be limited by pain, current pain 6/10 upper and lower back, sharp pain that is constant.  Weather does play a part in her pain.  Feels she  has improved by 40%.  No reports of  recent falls.  Reports compliance with HEP and has upgraded from red to green theraband.    PERTINENT HISTORY: 2 Low back surgeries,  Rt Total shoulder replacement, pt has a daily aide.   PAIN:  Are you having pain? Yes: NPRS scale: 6/10 upper and lower back. Pain location: between shoulder blades  Pain description: sharp burning  Aggravating factors: being upright  Relieving factors: heat and pain meds    PLOF: Needs assistance with homemaking  PATIENT GOALS: less pain  NEXT MD VISIT: after therapy   OBJECTIVE:  UPPER EXTREMITY MMT:  MMT Right 06/04/22 Left 06/04/22 Right 06/11/22 Left 06/11/22  Shoulder flexion 4 4 4/5 4+/5   Shoulder extension      Shoulder abduction 4 4 4/5 4/5  Shoulder adduction      Shoulder internal rotation      Shoulder external rotation 4 4 4/5 4/5  Middle trapezius      Lower trapezius      Elbow flexion      Elbow extension      Wrist flexion      Wrist extension      Wrist ulnar deviation      Wrist radial deviation      Wrist pronation      Wrist supination      Grip strength (lbs)      Hip abduction   3/5 3/5  Hip extension   3/5 3+/5  (Blank rows = not tested)  Core/ lumbar extension strength: 4/5 able to stand upright from forward flexed position without UE assist  LUMBAR ROM:   AROM 06/04/22 06/11/22  Flexion 25% limited 25% limited  Extension 75% limited 75% limited  Right lateral flexion 25% limited 25% limited  Left lateral flexion 25% limited 25% limited   Right rotation    Left rotation     (Blank rows = not tested)  PATIENT SURVEYS:  FOTO 37   06/11/22: 39%   POSTURE: rounded shoulders, decreased lumbar lordosis, and increased thoracic kyphosis  UE AROM: WFL but has significant increased pain and moves very slowly UE STrength overall for flexion/abduction, ER, horizontal abduction and adduction  are all 3+/5    TODAY'S TREATMENT:                                                                                                                               DATE: 06/11/22: Reviewed goals MMT ROM Benefits with good seated posture SLS Lt 15" Rt 3" FOTO Sidelying abd: 10 Bridge 10x   06/06/22 Band rows GTB 2 x 10  Shoulder extension GTB 2 x10 Bilateral shoulder ER GTB 2 x 10   Pec stretch at wall 3 x 20" Thoracic extension seated 15 x 3" Seated UT stretch 3 x 20"  Standing shoulder flexion/ scaption 1# 2 x 10 each   06/04/22 Reassess AROM MMT  GTB rows 2 x 10  Shoulder extension GTB 2 x10 Bilateral shoulder ER GTB 2 x 10   05/23/22:  Standing :  wall arch x 10                   Back to wall with B flexion x 10                    Postural exercises using green theraband:  scapular retraction, rows and shoulder extension all x 10 reps  Prone:       Single arm raise B x 10                   W back x 10                    Shoulder extension x 10  Supine:     decompression exercises: Flexion, ER, SASH and horizontal abduction x 10 each. Red theraband   05/21/22: Reviewed goals, educated importance of HEP compliance for maximal benefits Pt able to recall seated posture and ER, cueing to improve mechanics with seated scapular retraction  Supine: Deep breathing, pair exhalation with abdominal set x 3 min Marching 10x 3" with ab set Decompression with RTB 5x each with ab set   Seated Seated posture 3D thoracic excursion 5x each direction Abdominal sets 10x 5"  Standing:  RTB rows 10x RTB  shoulder extension 10x  PATIENT EDUCATION:  Education details: The importance of good posture; HEP Person educated: Patient Education method: Explanation Education comprehension: verbalized understanding and returned demonstration  HOME EXERCISE PROGRAM: Access Code: [email or web address] URL: [email or web address]  06/11/22 -Bridge -Sidelying Abd  06/06/22 - Seated Upper Trapezius Stretch  - 1-2 x daily - 7 x weekly - 1 sets - 3 reps - 30 second hold - Doorway Pec Stretch  at 60 Degrees Abduction with Arm Straight  - 1-2 x daily - 7 x weekly - 1 sets - 3 reps - 30 second hold - Seated Thoracic Lumbar Extension  - 1-2 x daily - 7 x weekly - 1 sets - 10 reps - 3 second hold  Date: 05/02/2022 Prepared by: Rayetta Humphrey  Exercises - Correct Seated Posture  - 3 x daily - 7 x weekly - 1 sets - 5-10 reps - 3-5 seconds  hold - Seated Scapular Retraction  - 3 x daily - 7 x weekly - 1 sets - 10 reps - 3-5 seconds  hold - Seated Shoulder External Rotation  - 3 x daily - 7 x weekly - 1 sets - 10 reps - 3-5 seconds  hold  05/21/22: Abdominal sets, Decompression with RTB, postural red theraband standing row and shoulder extension  ASSESSMENT:  CLINICAL IMPRESSION: Reviewed goals with strength and pain very similar to last reassessment.  Pt continues to be limited by pain and noted weakness.  Discussed balance with noted difficulty SLS, tested hip muscualture with noted weakness.  Pt educated on importance of hip strength to reduce demand on lower back.  Also discussed posture, pt presents with elevated shoulders partially due to cold weather and pain.  Pt feels ready for DC to HEP and returns to MD next week.  Feels she has improved by 40% and reports compliant with HEP.  Added hip strengthening exercises to HEP with handout given.    OBJECTIVE IMPAIRMENTS: decreased strength, impaired UE functional use, postural dysfunction, obesity, and pain.   ACTIVITY LIMITATIONS: carrying, lifting, standing, and reach over head  PARTICIPATION LIMITATIONS:  Pt has an  aide who does all housework pt basically does not physical activity due to low back issues.   PERSONAL FACTORS: Behavior pattern, Fitness, Time since onset of injury/illness/exacerbation, and 1-2 comorbidities: 2 previous back surgeries, past hernia repair whic pt states MD stated not to lift anything as she might get another hernia   are also affecting patient's functional outcome.   REHAB POTENTIAL: Fair    CLINICAL  DECISION MAKING: Stable/uncomplicated  EVALUATION COMPLEXITY: Low   GOALS: Goals reviewed with patient? No  SHORT TERM GOALS: Target date: 07/02/2022  Pt to be I in a HEP to decrease pain to 5/10 Baseline: Reports compliance, current pain 5/10 Goal status: MET  2.  Pt to be increase core strength by 1//2 grade to allow pt to be able to put dishes up without increased pain.  Baseline: Current 4/5; no evidence that evaluating therapist tested this, nothing to compare to.  06/11/22:  reports she can stand a little longer prior rest breaks.  06/11/22:  Modified testing as unable to complete due to history of hernia.  No improvements since last tested.   Goal status: Deferred    LONG TERM GOALS: Target date: 07/23/2022  Pt to be I in advance HEP to allow thoracic pain to decrease to no greater than a 3/10 Baseline:  Reports compliance with advanced HEP, pain scale stays 6-8/10  Goal status: IN PROGRESS  2.  Pt to demonstrate good posture.  Baseline: 06/11/22:  reports she improved awareness, presents with elevated shoulders and forward head while answering questions today.   Goal status: IN PROGRESS  3.  PT core strength to be increased one grade to allow pt to be able to fold clothes without increased pain.  Baseline: Current 4/5; no evidence that evaluating therapist tested this, nothing to compare to.  06/11/22:  Able to stand to fold clothes in small amounts, increased difficulty with larger amounts Goal status: Deferred    PLAN:  PT FREQUENCY: 2x/week  PT DURATION: 6 weeks  PLANNED INTERVENTIONS: Therapeutic exercises, Therapeutic activity, Patient/Family education, Self Care, and Manual therapy.  PLAN FOR NEXT SESSION: DC to [place], LPTA/CLT; [place] Rayetta Humphrey, PT CLT [phone]  4:55 PM, 06/11/22

## 2022-06-13 ENCOUNTER — Encounter (HOSPITAL_COMMUNITY): Payer: Medicaid Other | Admitting: Physical Therapy

## 2022-06-19 ENCOUNTER — Institutional Professional Consult (permissible substitution): Payer: Medicaid Other | Admitting: Plastic Surgery

## 2022-07-17 ENCOUNTER — Encounter: Payer: Self-pay | Admitting: Plastic Surgery

## 2022-07-17 ENCOUNTER — Ambulatory Visit: Payer: Medicaid Other | Admitting: Plastic Surgery

## 2022-07-17 VITALS — BP 132/82 | HR 86 | Ht 62.0 in | Wt 164.8 lb

## 2022-07-17 DIAGNOSIS — Z87891 Personal history of nicotine dependence: Secondary | ICD-10-CM | POA: Diagnosis not present

## 2022-07-17 DIAGNOSIS — N62 Hypertrophy of breast: Secondary | ICD-10-CM

## 2022-07-17 NOTE — Progress Notes (Signed)
Referring Provider Monico Blitz, MD [place],  [place] [address]   CC:  Chief Complaint  Patient presents with   Consult      Alicia Tyler is an 52 y.o. female.  HPI: Ms. Wussow is a 52 year old female who was previously seen in the office regarding a bilateral breast reduction.  At her initial visit she was felt to be an acceptable candidate for a breast reduction however she was smoking and surgery was not offered to her at that time.  She states that she has stopped smoking over the past month and will not restart prior to scheduling surgery.  Allergies  Allergen Reactions   Aspirin Shortness Of Breath, Swelling and Rash   Bee Venom Anaphylaxis        Eletriptan Anaphylaxis and Swelling   Codeine Nausea And Vomiting and Rash   Penicillins Other (See Comments)    Makes hair fall out  Tolerates ANCEF Has patient had a PCN reaction causing immediate rash, facial/tongue/throat swelling, SOB or lightheadedness with hypotension: unknown Has patient had a PCN reaction causing severe rash involving mucus membranes or skin necrosis: unknown Has patient had a PCN reaction that required hospitalization unknown Has patient had a PCN reaction occurring within the last 10 years: No If all of the above answers are "NO", then may proceed with Cephalosporin use.      Outpatient Encounter Medications as of 07/17/2022  Medication Sig   Ascorbic Acid (VITAMIN C) 1000 MG tablet Take 1,000 mg by mouth in the morning and at bedtime.   Cholecalciferol (VITAMIN D3) 50 MCG (2000 UT) TABS Take 2,000 Units by mouth daily.   cyanocobalamin (,VITAMIN B-12,) 1000 MCG/ML injection Inject 1,000 mcg into the muscle every 30 (thirty) days.   escitalopram (LEXAPRO) 20 MG tablet Take 20 mg by mouth daily.   furosemide (LASIX) 20 MG tablet Take 20 mg by mouth 2 (two) times a week. Mondays and Fridays   LINZESS 290 MCG CAPS capsule TAKE ONE CAPSULE BY MOUTH EVERY DAY 30 minutes BEFORE breakfast    loratadine (CLARITIN) 10 MG tablet Take 10 mg by mouth daily.   methocarbamol (ROBAXIN) 750 MG tablet Take 1 tablet by mouth 2 (two) times daily.   ondansetron (ZOFRAN-ODT) 4 MG disintegrating tablet Take 4 mg by mouth every 8 (eight) hours as needed for nausea or vomiting.   oxyCODONE-acetaminophen (PERCOCET) 10-325 MG tablet Take 1 tablet by mouth every 8 (eight) hours as needed.   pantoprazole (PROTONIX) 40 MG tablet Take 1 tablet (40 mg total) by mouth 2 (two) times daily.   prochlorperazine (COMPAZINE) 10 MG tablet Take 1 tablet by mouth 3 (three) times daily as needed.   rosuvastatin (CRESTOR) 5 MG tablet Take 5 mg by mouth daily.   SUMAtriptan (IMITREX) 100 MG tablet Take 100 mg by mouth every 2 (two) hours as needed for migraine or headache. May repeat in 2 hours if headache persists or recurs.   Tetrahydroz-Dextran-PEG-Povid (VISINE ADVANCED RELIEF) 0.05-0.1-1-1 % SOLN Place 2 drops into both eyes 2 (two) times daily.   topiramate (TOPAMAX) 50 MG tablet Take 50 mg by mouth 2 (two) times daily.   Zinc Sulfate 220 (50 Zn) MG TABS Take 220 mg by mouth in the morning.   No facility-administered encounter medications on file as of 07/17/2022.     Past Medical History:  Diagnosis Date   Anxiety    BMI (body mass index) 20.0-29.01 Apr 2010 172 LBS   Chronic back pain  Degenerative disc disease, thoracic    Gastroparesis    GERD (gastroesophageal reflux disease)    Headache(784.0)    IBS (irritable bowel syndrome)    Incontinence of feces    WEARS DEPENDS AT HS     Neuropathy    PONV (postoperative nausea and vomiting)    Pseudoobstruction of colon 08/24/2014   Seasonal allergies    Serrated adenoma of colon 05/2010    Past Surgical History:  Procedure Laterality Date   ABLATION     endometrial ablation   APPENDECTOMY     BACK SURGERY  05/14/2011   Lumbar Fusion and cage   BIOPSY  06/22/2015   Procedure: BIOPSY;  Surgeon: Danie Binder, MD;  [facility];   Service: Endoscopy;;  gastric and esophageal bx's   BREAST LUMPECTOMY     from the left breast 2003,  2016   CHOLECYSTECTOMY     COLECTOMY N/A 08/24/2014   Procedure: TOTAL COLECTOMY;  Surgeon: Jamesetta So, MD;  [facility];  Service: General;  Laterality: N/A;   COLON SURGERY     COLONOSCOPY  NOV 2011 SCREENING    HYPERPLASTIC RECTAL POLYP, SML IH, incomplete due to discomfort   COLONOSCOPY  DEC 2011 w/ PROPOFOL   SERRATED ADENOMA, HYPERPLASTIC POLY[S[   ESOPHAGEAL DILATION     ESOPHAGEAL MANOMETRY N/A 12/29/2017   Procedure: ESOPHAGEAL MANOMETRY (EM);  Surgeon: Mauri Pole, MD;  [facility];  Service: Endoscopy;  Laterality: N/A;   ESOPHAGOGASTRODUODENOSCOPY (EGD) WITH PROPOFOL N/A 06/22/2015   Procedure: ESOPHAGOGASTRODUODENOSCOPY (EGD) WITH PROPOFOL;  Surgeon: Danie Binder, MD;  [facility];  Service: Endoscopy;  Laterality: N/A;   ESOPHAGOGASTRODUODENOSCOPY (EGD) WITH PROPOFOL N/A 01/21/2017   Procedure: ESOPHAGOGASTRODUODENOSCOPY (EGD) WITH PROPOFOL;  Surgeon: Danie Binder, MD;  [facility];  Service: Endoscopy;  Laterality: N/A;  9:30am   FLEXIBLE SIGMOIDOSCOPY N/A 06/22/2015   Procedure: FLEXIBLE SIGMOIDOSCOPY WITH PROPOFOL;  Surgeon: Danie Binder, MD;  [facility];  Service: Endoscopy;  Laterality: N/A;  0830    FLEXIBLE SIGMOIDOSCOPY N/A 11/24/2020   Procedure: FLEXIBLE SIGMOIDOSCOPY;  Surgeon: Eloise Harman, DO;  [facility];  Service: Endoscopy;  Laterality: N/A;  10:30am   HARDWARE REMOVAL N/A 09/03/2016   Procedure: Removal of Lumbar five-Sacrum one  hardware with Metrex;  Surgeon: Kristeen Miss, MD;  Location: [facility];  Service: Neurosurgery;  Laterality: N/A;   KNEE SURGERY Bilateral    2 on righ-1 scope and one open; and one on left   MOUTH SURGERY  07/21/2013   open colectomy      OVARIAN CYST REMOVAL     POLYPECTOMY  11/24/2020   Procedure: POLYPECTOMY;  Surgeon: Eloise Harman, DO;   [facility];  Service: Endoscopy;;   SAVORY DILATION N/A 01/21/2017   Procedure: SAVORY DILATION;  Surgeon: Danie Binder, MD;  [facility];  Service: Endoscopy;  Laterality: N/A;   SMART PILL PROCEDURE N/A 07/04/2014   Procedure: SMART PILL PROCEDURE;  Surgeon: Danie Binder, MD;  [facility];  Service: Endoscopy;  Laterality: N/A;  800   TOTAL SHOULDER ARTHROPLASTY Right 12/14/2020   Procedure: TOTAL SHOULDER ARTHROPLASTY;  Surgeon: Justice Britain, MD;  [facility];  Service: Orthopedics;  Laterality: Right;  197mn   TUBAL LIGATION      Family History  Problem Relation Age of Onset   Breast cancer Maternal Aunt    Colon cancer Neg Hx  Colon polyps Neg Hx     Social History   Social History Narrative   DAUGHTER IS SEEING IMPAIRED. LEARNING TO PLAY VIOLIN.     Review of Systems General: Denies fevers, chills, weight loss CV: Denies chest pain, shortness of breath, palpitations Breast: Large breasts which the patient feels are causing her upper back and neck pain  Physical Exam    07/17/2022    9:41 AM 03/15/2022    1:25 PM 01/17/2022    8:28 AM  Vitals with BMI  Height '5\' 2"'$  '5\' 2"'$  '5\' 3"'$   Weight 164 lbs 13 oz 166 lbs 6 oz 161 lbs 10 oz  BMI 30.13 37.10 62.69  Systolic 485 462 703  Diastolic 82 78 82  Pulse 86 78 80    General:  No acute distress,  Alert and oriented, Non-Toxic, Normal speech and affect Breast: Large pendulous breast with grade 3 ptosis.  No masses are felt there are no nipple abnormalities and no nipple discharge.  There there is a scar along the lateral border of the left breast where the patient has had 2 breast biopsies.  These were done over 10 years ago and the patient reports that the pathology results were normal.  Mammogram: There is no mammogram report available since 2016.  Patient will need mammogram results or a mammogram prior to scheduling surgery Assessment/Plan Symptomatic macromastia: Discussed  again the conduct of the breast reduction including the location of the incisions and the unpredictable nature of scarring.  Discussed the risks of bleeding, infection, seroma formation.  The use of drains.  The risk of nipple necrosis due to loss of blood supply especially in the face of nicotine use.  Discussed the postoperative restrictions including no heavy lifting greater than 20 pounds, no vigorous activity, and no submerging the incisions in water.  The patient understands each of these risks and asked that I schedule her for surgery.  Will submit her today however she should not be scheduled for surgery for at least 2 months.  She should also have a urine cotine level checked at her preoperative appointment.  I believe that I can remove between 400 g and 500 g of breast tissue per breast.  Camillia Herter 07/17/2022, 12:32 PM

## 2022-07-18 ENCOUNTER — Other Ambulatory Visit: Payer: Self-pay | Admitting: Gastroenterology

## 2022-07-18 DIAGNOSIS — K219 Gastro-esophageal reflux disease without esophagitis: Secondary | ICD-10-CM

## 2022-07-20 NOTE — Progress Notes (Deleted)
GI Office Note    Referring Provider: Monico Blitz, MD Primary Care Physician:  Monico Blitz, MD Primary Gastroenterologist: Elon Alas. Abbey Chatters, DO  Date:  07/20/2022  ID:  Alicia Tyler, DOB 05-30-1971, MRN [id number]   Chief Complaint   No chief complaint on file.   History of Present Illness  Alicia Tyler is a 52 y.o. female with a history of anxiety , gastroparesis, total colectomy in 2016, GERD, IBS, allergies, dysphagia, and colon polyps*** presenting today for follow up.   Flexible sigmoidoscopy and EGD/ED  BPE 2018:  EGD July 2018: -  Last office visit 01/17/22. ***c/o solid and pill dysphagia. Continued belching. Ongoing nausea however likely more given migraines. C/o worsening gas and taking gas ex but unable to pass gas. BM daily with Linzess. Late night cereal. BPE ordered, EGD if abnormal. Low FODMAP diet. Continue Linzess 290 daily. Stop omeprazole, start pantoprazole '40mg'$  daily. Continue zofram BID. Simethicone as needed.   BPE Impression 01/24/22: -No esophageal abnormalities.  -Abnormal border of the posterior tongue, irregular with barium extending into enters disease, suspicious for polypoid mass.  -Direct visualization recommended to exclude neoplasm. -Referred to ENT for evaluation.   Appears patient has not followed up with ENT.   Today:   Current Outpatient Medications  Medication Sig Dispense Refill   Ascorbic Acid (VITAMIN C) 1000 MG tablet Take 1,000 mg by mouth in the morning and at bedtime.     Cholecalciferol (VITAMIN D3) 50 MCG (2000 UT) TABS Take 2,000 Units by mouth daily.     cyanocobalamin (,VITAMIN B-12,) 1000 MCG/ML injection Inject 1,000 mcg into the muscle every 30 (thirty) days.     escitalopram (LEXAPRO) 20 MG tablet Take 20 mg by mouth daily.     furosemide (LASIX) 20 MG tablet Take 20 mg by mouth 2 (two) times a week. Mondays and Fridays     LINZESS 290 MCG CAPS capsule TAKE ONE CAPSULE BY MOUTH EVERY DAY 30 minutes BEFORE  breakfast 90 capsule 2   loratadine (CLARITIN) 10 MG tablet Take 10 mg by mouth daily.     methocarbamol (ROBAXIN) 750 MG tablet Take 1 tablet by mouth 2 (two) times daily.     ondansetron (ZOFRAN-ODT) 4 MG disintegrating tablet Take 4 mg by mouth every 8 (eight) hours as needed for nausea or vomiting.     oxyCODONE-acetaminophen (PERCOCET) 10-325 MG tablet Take 1 tablet by mouth every 8 (eight) hours as needed.     pantoprazole (PROTONIX) 40 MG tablet TAKE 1 TABLET BY MOUTH TWICE DAILY 90 tablet 3   prochlorperazine (COMPAZINE) 10 MG tablet Take 1 tablet by mouth 3 (three) times daily as needed.     rosuvastatin (CRESTOR) 5 MG tablet Take 5 mg by mouth daily.     SUMAtriptan (IMITREX) 100 MG tablet Take 100 mg by mouth every 2 (two) hours as needed for migraine or headache. May repeat in 2 hours if headache persists or recurs.     Tetrahydroz-Dextran-PEG-Povid (VISINE ADVANCED RELIEF) 0.05-0.1-1-1 % SOLN Place 2 drops into both eyes 2 (two) times daily.     topiramate (TOPAMAX) 50 MG tablet Take 50 mg by mouth 2 (two) times daily.     Zinc Sulfate 220 (50 Zn) MG TABS Take 220 mg by mouth in the morning.     No current facility-administered medications for this visit.    Past Medical History:  Diagnosis Date   Anxiety    BMI (body mass index) 20.0-29.01 Apr 2010 172 LBS   Chronic  back pain    Degenerative disc disease, thoracic    Gastroparesis    GERD (gastroesophageal reflux disease)    Headache(784.0)    IBS (irritable bowel syndrome)    Incontinence of feces    WEARS DEPENDS AT HS     Neuropathy    PONV (postoperative nausea and vomiting)    Pseudoobstruction of colon 08/24/2014   Seasonal allergies    Serrated adenoma of colon 05/2010    Past Surgical History:  Procedure Laterality Date   ABLATION     endometrial ablation   APPENDECTOMY     BACK SURGERY  05/14/2011   Lumbar Fusion and cage   BIOPSY  06/22/2015   Procedure: BIOPSY;  Surgeon: Danie Binder, MD;  [facility]:  [facility];  Service: Endoscopy;;  gastric and esophageal bx's   BREAST LUMPECTOMY     from the left breast 2003,  2016   CHOLECYSTECTOMY     COLECTOMY N/A 08/24/2014   Procedure: TOTAL COLECTOMY;  Surgeon: Jamesetta So, MD;  Location: [facility];  Service: General;  Laterality: N/A;   COLON SURGERY     COLONOSCOPY  NOV 2011 SCREENING    HYPERPLASTIC RECTAL POLYP, SML IH, incomplete due to discomfort   COLONOSCOPY  DEC 2011 w/ PROPOFOL   SERRATED ADENOMA, HYPERPLASTIC POLY[S[   ESOPHAGEAL DILATION     ESOPHAGEAL MANOMETRY N/A 12/29/2017   Procedure: ESOPHAGEAL MANOMETRY (EM);  Surgeon: Mauri Pole, MD;  Location: [facility];  Service: Endoscopy;  Laterality: N/A;   ESOPHAGOGASTRODUODENOSCOPY (EGD) WITH PROPOFOL N/A 06/22/2015   Procedure: ESOPHAGOGASTRODUODENOSCOPY (EGD) WITH PROPOFOL;  Surgeon: Danie Binder, MD;  [facility];  Service: Endoscopy;  Laterality: N/A;   ESOPHAGOGASTRODUODENOSCOPY (EGD) WITH PROPOFOL N/A 01/21/2017   Procedure: ESOPHAGOGASTRODUODENOSCOPY (EGD) WITH PROPOFOL;  Surgeon: Danie Binder, MD;  [facility];  Service: Endoscopy;  Laterality: N/A;  9:30am   FLEXIBLE SIGMOIDOSCOPY N/A 06/22/2015   Procedure: FLEXIBLE SIGMOIDOSCOPY WITH PROPOFOL;  Surgeon: Danie Binder, MD;  [facility]: [facility];  Service: Endoscopy;  Laterality: N/A;  0830    FLEXIBLE SIGMOIDOSCOPY N/A 11/24/2020   Procedure: FLEXIBLE SIGMOIDOSCOPY;  Surgeon: Eloise Harman, DO;  [facility];  Service: Endoscopy;  Laterality: N/A;  10:30am   HARDWARE REMOVAL N/A 09/03/2016   Procedure: Removal of Lumbar five-Sacrum one  hardware with Metrex;  Surgeon: Kristeen Miss, MD;  Location: [facility];  Service: Neurosurgery;  Laterality: N/A;   KNEE SURGERY Bilateral    2 on righ-1 scope and one open; and one on left   MOUTH SURGERY  07/21/2013   open colectomy      OVARIAN CYST REMOVAL     POLYPECTOMY  11/24/2020   Procedure: POLYPECTOMY;  Surgeon:  Eloise Harman, DO;  [facility];  Service: Endoscopy;;   SAVORY DILATION N/A 01/21/2017   Procedure: SAVORY DILATION;  Surgeon: Danie Binder, MD;  [facility];  Service: Endoscopy;  Laterality: N/A;   SMART PILL PROCEDURE N/A 07/04/2014   Procedure: SMART PILL PROCEDURE;  Surgeon: Danie Binder, MD;  [facility];  Service: Endoscopy;  Laterality: N/A;  800   TOTAL SHOULDER ARTHROPLASTY Right 12/14/2020   Procedure: TOTAL SHOULDER ARTHROPLASTY;  Surgeon: Justice Britain, MD;  Location: [facility];  Service: Orthopedics;  Laterality: Right;  114mn   TUBAL LIGATION      Family History  Problem Relation Age of Onset   Breast cancer Maternal Aunt  Colon cancer Neg Hx    Colon polyps Neg Hx     Allergies as of 07/22/2022 - Review Complete 07/17/2022  Allergen Reaction Noted   Aspirin Shortness Of Breath, Swelling, and Rash    Bee venom Anaphylaxis 07/08/2013   Eletriptan Anaphylaxis and Swelling 04/14/2020   Codeine Nausea And Vomiting and Rash    Penicillins Other (See Comments)     Social History   Socioeconomic History   Marital status: Married    Spouse name: Not on file   Number of children: Not on file   Years of education: Not on file   Highest education level: Not on file  Occupational History   Not on file  Tobacco Use   Smoking status: Every Day    Packs/day: 1.00    Years: 37.00    Total pack years: 37.00    Types: Cigarettes   Smokeless tobacco: Never   Tobacco comments:    vapes CBD oil  Vaping Use   Vaping Use: Some days  Substance and Sexual Activity   Alcohol use: Yes    Comment: once a year when they go to the beach   Drug use: No   Sexual activity: Yes    Birth control/protection: Surgical, Post-menopausal    Comment: tubal  Other Topics Concern   Not on file  Social History Narrative   DAUGHTER IS SEEING IMPAIRED. LEARNING TO PLAY VIOLIN.   Social Determinants of Health   Financial Resource Strain: Not  on file  Food Insecurity: Not on file  Transportation Needs: Not on file  Physical Activity: Not on file  Stress: Not on file  Social Connections: Not on file     Review of Systems   Gen: Denies fever, chills, anorexia. Denies fatigue, weakness, weight loss.  CV: Denies chest pain, palpitations, syncope, peripheral edema, and claudication. Resp: Denies dyspnea at rest, cough, wheezing, coughing up blood, and pleurisy. GI: See HPI Derm: Denies rash, itching, dry skin Psych: Denies depression, anxiety, memory loss, confusion. No homicidal or suicidal ideation.  Heme: Denies bruising, bleeding, and enlarged lymph nodes.   Physical Exam   LMP 07/22/2011 Comment: tubal ligation/ablation  General:   Alert and oriented. No distress noted. Pleasant and cooperative.  Head:  Normocephalic and atraumatic. Eyes:  Conjuctiva clear without scleral icterus. Mouth:  Oral mucosa pink and moist. Good dentition. No lesions. Lungs:  Clear to auscultation bilaterally. No wheezes, rales, or rhonchi. No distress.  Heart:  S1, S2 present without murmurs appreciated.  Abdomen:  +BS, soft, non-tender and non-distended. No rebound or guarding. No HSM or masses noted. Rectal: *** Msk:  Symmetrical without gross deformities. Normal posture. Extremities:  Without edema. Neurologic:  Alert and  oriented x4 Psych:  Alert and cooperative. Normal mood and affect.   Assessment  Alicia Tyler is a 52 y.o. female with a history of anxiety , gastroparesis, total colectomy in 2016, GERD, IBS, allergies, dysphagia, and colon polyps*** presenting today for follow up.   GERD:  Gastroparesis:   Constipation:  Dysphagia: BPE with no esophageal abnormality, Abnormal border of the posterior tongue, irregular with barium extending into enters disease,  suspicious for polypoid mass. ENT referral placed. Appears patient did not follow up.   PLAN   ***     Venetia Night, MSN, FNP-BC, AGACNP-BC [facility]

## 2022-07-22 ENCOUNTER — Inpatient Hospital Stay: Payer: Medicaid Other | Admitting: Gastroenterology

## 2022-08-06 ENCOUNTER — Telehealth: Payer: Self-pay | Admitting: Plastic Surgery

## 2022-08-06 NOTE — Telephone Encounter (Signed)
Patient says she was told her surgery would be in March. Pt checking to see if it has been scheduled yet. Advised pt she will be contacted once the provider secures a date/time with the OR and to expect a call at that time.

## 2022-08-12 ENCOUNTER — Telehealth: Payer: Self-pay | Admitting: Plastic Surgery

## 2022-08-12 NOTE — Telephone Encounter (Signed)
Auth started through portal and clinicals uploaded.  Auth pending status.

## 2022-09-10 ENCOUNTER — Other Ambulatory Visit: Payer: Self-pay | Admitting: Gastroenterology

## 2022-10-03 ENCOUNTER — Ambulatory Visit (INDEPENDENT_AMBULATORY_CARE_PROVIDER_SITE_OTHER): Payer: Self-pay | Admitting: Plastic Surgery

## 2022-10-03 ENCOUNTER — Encounter: Payer: Self-pay | Admitting: Plastic Surgery

## 2022-10-03 VITALS — Ht 62.0 in | Wt 174.0 lb

## 2022-10-03 DIAGNOSIS — N62 Hypertrophy of breast: Secondary | ICD-10-CM

## 2022-10-03 NOTE — Progress Notes (Signed)
Alicia Tyler returns today for reevaluation.  She was initially seen for bilateral breast reduction and I felt that I could take at least 500 g per breast however this was not enough for approval by her insurance company.  She has asked for reevaluation to determine if she might meet the criteria.  We had a long discussion about how the determination is made and how this is based simply on what I feel that I can remove.  If I do not remove is much as I say I can she may be held responsible for the cost of the surgery.  She understands this and asked me to reevaluate her and see if there is any more that I can remove.  On physical exam the left breast is clearly heavier than the right.  I believe I can get 650 g from the left breast and possibly 600 g from the right breast.  I will resubmit these new weights for consideration for her surgery.

## 2022-11-26 ENCOUNTER — Telehealth: Payer: Self-pay | Admitting: Plastic Surgery

## 2022-11-26 NOTE — Telephone Encounter (Signed)
Gave appeal update to patient per [facility] phone system that states it is still pending.

## 2023-01-21 ENCOUNTER — Telehealth: Payer: Self-pay | Admitting: Plastic Surgery

## 2023-01-21 NOTE — Telephone Encounter (Signed)
Pt called today and requested status of approval for Sx.  Dr. Ladona Ridgel appealed and as of 11/26/22 was still pending.  Pt stated that she was told by Medicaid that she would have answer by 11/10/22 and has still not heard anything.  Please call her at [phone]

## 2023-01-28 NOTE — Telephone Encounter (Signed)
Pt called back to follow up, requested status on insurance approval. I told her you would give her a call.

## 2023-01-31 NOTE — Telephone Encounter (Signed)
error 

## 2023-02-04 ENCOUNTER — Other Ambulatory Visit: Payer: Self-pay | Admitting: Family Medicine

## 2023-02-04 DIAGNOSIS — Z1231 Encounter for screening mammogram for malignant neoplasm of breast: Secondary | ICD-10-CM

## 2023-02-18 ENCOUNTER — Telehealth: Payer: Self-pay | Admitting: Plastic Surgery

## 2023-02-18 NOTE — Telephone Encounter (Signed)
Patient called and would like to know when her surgery is going to be scheduled.  Please call her at [phone]

## 2023-03-04 ENCOUNTER — Inpatient Hospital Stay: Admission: RE | Admit: 2023-03-04 | Payer: Medicaid Other | Source: Ambulatory Visit

## 2023-03-09 ENCOUNTER — Other Ambulatory Visit: Payer: Self-pay | Admitting: Gastroenterology

## 2023-03-09 DIAGNOSIS — K219 Gastro-esophageal reflux disease without esophagitis: Secondary | ICD-10-CM

## 2023-03-10 NOTE — Telephone Encounter (Signed)
Pt was made aware and appt was made

## 2023-03-10 NOTE — Telephone Encounter (Signed)
Patient needs follow up visit after this refill.

## 2023-03-11 ENCOUNTER — Ambulatory Visit
Admission: RE | Admit: 2023-03-11 | Discharge: 2023-03-11 | Disposition: A | Discharge status: Home / Self Care | Payer: Medicaid Other | Source: Ambulatory Visit | Attending: Family Medicine | Admitting: Family Medicine

## 2023-03-11 DIAGNOSIS — Z1231 Encounter for screening mammogram for malignant neoplasm of breast: Secondary | ICD-10-CM

## 2023-04-07 ENCOUNTER — Ambulatory Visit: Payer: Medicaid Other | Admitting: Gastroenterology

## 2023-04-07 ENCOUNTER — Other Ambulatory Visit: Payer: Self-pay | Admitting: Gastroenterology

## 2023-04-07 DIAGNOSIS — K219 Gastro-esophageal reflux disease without esophagitis: Secondary | ICD-10-CM

## 2023-04-07 NOTE — Telephone Encounter (Signed)
Patient needs office visit for further refills

## 2023-04-15 ENCOUNTER — Ambulatory Visit: Payer: Medicaid Other | Admitting: Gastroenterology

## 2023-04-15 ENCOUNTER — Encounter: Payer: Self-pay | Admitting: Gastroenterology

## 2023-04-15 ENCOUNTER — Other Ambulatory Visit: Payer: Self-pay

## 2023-04-15 ENCOUNTER — Encounter (HOSPITAL_COMMUNITY): Payer: Self-pay

## 2023-04-15 ENCOUNTER — Emergency Department (HOSPITAL_COMMUNITY): Payer: Medicaid Other

## 2023-04-15 ENCOUNTER — Emergency Department (HOSPITAL_COMMUNITY)
Admission: EM | Admit: 2023-04-15 | Discharge: 2023-04-15 | Disposition: A | Discharge status: Home / Self Care | Payer: Medicaid Other | Attending: Student | Admitting: Student

## 2023-04-15 VITALS — BP 135/95 | HR 112 | Temp 97.9°F | Ht 62.0 in | Wt 163.0 lb

## 2023-04-15 DIAGNOSIS — R109 Unspecified abdominal pain: Secondary | ICD-10-CM

## 2023-04-15 DIAGNOSIS — K5900 Constipation, unspecified: Secondary | ICD-10-CM | POA: Diagnosis not present

## 2023-04-15 DIAGNOSIS — K219 Gastro-esophageal reflux disease without esophagitis: Secondary | ICD-10-CM | POA: Diagnosis not present

## 2023-04-15 DIAGNOSIS — R140 Abdominal distension (gaseous): Secondary | ICD-10-CM

## 2023-04-15 DIAGNOSIS — K3184 Gastroparesis: Secondary | ICD-10-CM | POA: Diagnosis not present

## 2023-04-15 DIAGNOSIS — R197 Diarrhea, unspecified: Secondary | ICD-10-CM | POA: Insufficient documentation

## 2023-04-15 DIAGNOSIS — R1010 Upper abdominal pain, unspecified: Secondary | ICD-10-CM | POA: Insufficient documentation

## 2023-04-15 DIAGNOSIS — R112 Nausea with vomiting, unspecified: Secondary | ICD-10-CM | POA: Diagnosis present

## 2023-04-15 DIAGNOSIS — K581 Irritable bowel syndrome with constipation: Secondary | ICD-10-CM

## 2023-04-15 DIAGNOSIS — R110 Nausea: Secondary | ICD-10-CM

## 2023-04-15 LAB — COMPREHENSIVE METABOLIC PANEL
ALT: 61 U/L — ABNORMAL HIGH (ref 0–44)
AST: 23 U/L (ref 15–41)
Albumin: 5.2 g/dL — ABNORMAL HIGH (ref 3.5–5.0)
Alkaline Phosphatase: 94 U/L (ref 38–126)
Anion gap: 12 (ref 5–15)
BUN: 22 mg/dL — ABNORMAL HIGH (ref 6–20)
CO2: 19 mmol/L — ABNORMAL LOW (ref 22–32)
Calcium: 10.4 mg/dL — ABNORMAL HIGH (ref 8.9–10.3)
Chloride: 104 mmol/L (ref 98–111)
Creatinine, Ser: 1.14 mg/dL — ABNORMAL HIGH (ref 0.44–1.00)
GFR, Estimated: 58 mL/min — ABNORMAL LOW (ref >60)
Glucose, Bld: 112 mg/dL — ABNORMAL HIGH (ref 70–99)
Potassium: 4.8 mmol/L (ref 3.5–5.1)
Sodium: 135 mmol/L (ref 135–145)
Total Bilirubin: 0.7 mg/dL (ref 0.3–1.2)
Total Protein: 9 g/dL — ABNORMAL HIGH (ref 6.5–8.1)

## 2023-04-15 LAB — CBC
HCT: 52.9 % — ABNORMAL HIGH (ref 36.0–46.0)
Hemoglobin: 17.5 g/dL — ABNORMAL HIGH (ref 12.0–15.0)
MCH: 29.2 pg (ref 26.0–34.0)
MCHC: 33.1 g/dL (ref 30.0–36.0)
MCV: 88.3 fL (ref 80.0–100.0)
Platelets: 281 10*3/uL (ref 150–400)
RBC: 5.99 MIL/uL — ABNORMAL HIGH (ref 3.87–5.11)
RDW: 13.2 % (ref 11.5–15.5)
WBC: 10.8 10*3/uL — ABNORMAL HIGH (ref 4.0–10.5)
nRBC: 0 % (ref 0.0–0.2)

## 2023-04-15 LAB — URINALYSIS, ROUTINE W REFLEX MICROSCOPIC
Glucose, UA: NEGATIVE mg/dL
Hgb urine dipstick: NEGATIVE
Ketones, ur: 20 mg/dL — AB
Leukocytes,Ua: NEGATIVE
Nitrite: NEGATIVE
Protein, ur: 100 mg/dL — AB
Specific Gravity, Urine: 1.035 — ABNORMAL HIGH (ref 1.005–1.030)
pH: 5 (ref 5.0–8.0)

## 2023-04-15 LAB — MAGNESIUM: Magnesium: 2.2 mg/dL (ref 1.7–2.4)

## 2023-04-15 LAB — LIPASE, BLOOD: Lipase: 24 U/L (ref 11–51)

## 2023-04-15 MED ORDER — ONDANSETRON 4 MG PO TBDP: 4.0000 mg | ORAL_TABLET | Freq: Three times a day (TID) | ORAL | 1 refills | Status: DC | PRN

## 2023-04-15 MED ORDER — IOHEXOL 300 MG/ML  SOLN
100.0000 mL | Freq: Once | INTRAMUSCULAR | Status: AC | PRN
  Administered 2023-04-15: 100 mL via INTRAVENOUS

## 2023-04-15 MED ORDER — SODIUM CHLORIDE 0.9 % IV BOLUS
1000.0000 mL | Freq: Once | INTRAVENOUS | Status: AC
  Administered 2023-04-15: 1000 mL via INTRAVENOUS

## 2023-04-15 MED ORDER — ONDANSETRON HCL 4 MG/2ML IJ SOLN
4.0000 mg | Freq: Once | INTRAMUSCULAR | Status: AC
  Administered 2023-04-15: 4 mg via INTRAVENOUS
  Filled 2023-04-15: qty 2

## 2023-04-15 MED ORDER — PANTOPRAZOLE SODIUM 40 MG PO TBEC: 40.0000 mg | DELAYED_RELEASE_TABLET | Freq: Two times a day (BID) | ORAL | 5 refills | Status: DC

## 2023-04-15 MED ORDER — METOCLOPRAMIDE HCL 5 MG PO TABS: 5.0000 mg | ORAL_TABLET | Freq: Three times a day (TID) | ORAL | 1 refills | Status: DC | PRN

## 2023-04-15 MED ORDER — FENTANYL CITRATE PF 50 MCG/ML IJ SOSY
25.0000 ug | PREFILLED_SYRINGE | Freq: Once | INTRAMUSCULAR | Status: AC
  Administered 2023-04-15: 25 ug via INTRAVENOUS
  Filled 2023-04-15: qty 1

## 2023-04-15 NOTE — ED Provider Notes (Signed)
[place] EMERGENCY DEPARTMENT AT [facility] Provider Note   CSN: [id number] Arrival date & time: 04/15/23  1244     History {Add pertinent medical, surgical, social history, OB history to HPI:1} Chief Complaint  Patient presents with   Abdominal Pain    Alicia Tyler is a 52 y.o. female.   Abdominal Pain      Home Medications Prior to Admission medications   Medication Sig Start Date End Date Taking? Authorizing Provider  Ascorbic Acid (VITAMIN C) 1000 MG tablet Take 1,000 mg by mouth in the morning and at bedtime.    [provider]  Cholecalciferol (VITAMIN D3) 50 MCG (2000 UT) TABS Take 2,000 Units by mouth daily.    [provider]  cyanocobalamin (,VITAMIN B-12,) 1000 MCG/ML injection Inject 1,000 mcg into the muscle every 30 (thirty) days. 11/22/20   [provider]  escitalopram (LEXAPRO) 20 MG tablet Take 20 mg by mouth daily.    [provider]  furosemide (LASIX) 20 MG tablet Take 20 mg by mouth 2 (two) times a week. Mondays and Fridays    [provider]  LINZESS 290 MCG CAPS capsule TAKE ONE CAPSULE BY MOUTH EVERY DAY 30 minutes BEFORE BREAKFAST 09/10/22   Aida Raider, NP  loratadine (CLARITIN) 10 MG tablet Take 10 mg by mouth daily.    [provider]  methocarbamol (ROBAXIN) 750 MG tablet Take 1 tablet by mouth 2 (two) times daily. 12/08/19   [provider]  metoCLOPramide (REGLAN) 5 MG tablet Take 1 tablet (5 mg total) by mouth every 8 (eight) hours as needed for nausea. 04/15/23   Aida Raider, NP  naloxone [facility]) nasal spray 4 mg/0.1 mL naloxone 4 mg/actuation nasal spray  one SPRAY into nose ONCE AS NEEDED FOR UP TO one DOSE 01/30/21   [provider]  ondansetron (ZOFRAN-ODT) 4 MG disintegrating tablet Take 1 tablet (4 mg total) by mouth every 8 (eight) hours as needed for nausea or vomiting. 04/15/23   Aida Raider, NP  pantoprazole (PROTONIX) 40 MG tablet Take  1 tablet (40 mg total) by mouth 2 (two) times daily. 04/15/23   Aida Raider, NP  phentermine (ADIPEX-P) 37.5 MG tablet Take 37.5 mg by mouth daily before breakfast.    [provider]  prochlorperazine (COMPAZINE) 10 MG tablet Take 1 tablet by mouth 3 (three) times daily as needed.    [provider]  promethazine (PHENERGAN) 25 MG tablet Take 25 mg by mouth every 6 (six) hours as needed for nausea. 08/18/22   [provider]  rosuvastatin (CRESTOR) 5 MG tablet Take 5 mg by mouth daily.    [provider]  SUMAtriptan (IMITREX) 100 MG tablet Take 100 mg by mouth every 2 (two) hours as needed for migraine or headache. May repeat in 2 hours if headache persists or recurs.    [provider]  Tetrahydroz-Dextran-PEG-Povid (VISINE ADVANCED RELIEF) 0.05-0.1-1-1 % SOLN Place 2 drops into both eyes 2 (two) times daily.    [provider]  topiramate (TOPAMAX) 50 MG tablet Take 50 mg by mouth 2 (two) times daily.    [provider]  Zinc Sulfate 220 (50 Zn) MG TABS Take 220 mg by mouth in the morning.    [provider]      Allergies    Aspirin, Bee venom, Eletriptan, Codeine, and Penicillins    Review of Systems   Review of Systems  Gastrointestinal:  Positive for abdominal pain.  Physical Exam Updated Vital Signs BP 123/78   Pulse 83   Temp (!) 97.4 F (36.3 C) (Tympanic)   Resp 16   Ht 5\' 2"  (1.575 m)   Wt 73.9 kg   LMP 07/22/2011 Comment: tubal ligation/ablation  SpO2 97%   BMI 29.81 kg/m  Physical Exam  ED Results / Procedures / Treatments   Labs (all labs ordered are listed, but only abnormal results are displayed) Labs Reviewed  COMPREHENSIVE METABOLIC PANEL - Abnormal; Notable for the following components:      Result Value   CO2 19 (*)    Glucose, Bld 112 (*)    BUN 22 (*)    Creatinine, Ser 1.14 (*)    Calcium 10.4 (*)    Total Protein 9.0 (*)    Albumin 5.2 (*)    ALT 61 (*)    GFR,  Estimated 58 (*)    All other components within normal limits  CBC - Abnormal; Notable for the following components:   WBC 10.8 (*)    RBC 5.99 (*)    Hemoglobin 17.5 (*)    HCT 52.9 (*)    All other components within normal limits  URINALYSIS, ROUTINE W REFLEX MICROSCOPIC - Abnormal; Notable for the following components:   Color, Urine AMBER (*)    APPearance HAZY (*)    Specific Gravity, Urine 1.035 (*)    Bilirubin Urine MODERATE (*)    Ketones, ur 20 (*)    Protein, ur 100 (*)    Bacteria, UA RARE (*)    All other components within normal limits  LIPASE, BLOOD  MAGNESIUM    EKG None  Radiology CT ABDOMEN PELVIS W CONTRAST  Result Date: 04/15/2023 CLINICAL DATA:  Left lower quadrant pain EXAM: CT ABDOMEN AND PELVIS WITH CONTRAST TECHNIQUE: Multidetector CT imaging of the abdomen and pelvis was performed using the standard protocol following bolus administration of intravenous contrast. RADIATION DOSE REDUCTION: This exam was performed according to the departmental dose-optimization program which includes automated exposure control, adjustment of the mA and/or kV according to patient size and/or use of iterative reconstruction technique. CONTRAST:  OMNIPAQUE IOHEXOL 300 MG/ML  SOLN COMPARISON:  08/18/2022 FINDINGS: Lower chest: No acute abnormality. Hepatobiliary: No focal liver abnormality is seen. Status post cholecystectomy. No biliary dilatation. Pancreas: Unremarkable. No pancreatic ductal dilatation or surrounding inflammatory changes. Spleen: Normal in size without focal abnormality. Adrenals/Urinary Tract: Adrenal glands are within normal limits. Kidneys demonstrate a normal enhancement pattern bilaterally. No renal calculi or obstructive changes are seen. The bladder is decompressed. Stomach/Bowel: Postsurgical changes are noted in the right abdomen consistent with prior colectomy. The small bowel to colon anastomosis is widely patent. No obstructive changes are seen.  Stomach is within normal limits. Vascular/Lymphatic: No significant vascular findings are present. No enlarged abdominal or pelvic lymph nodes. Reproductive: Uterus and bilateral adnexa are unremarkable. Other: No abdominal wall hernia or abnormality. No abdominopelvic ascites. Musculoskeletal: No acute or significant osseous findings. IMPRESSION: Postsurgical changes without acute abnormality. Electronically Signed   By: Alcide Clever M.D.   On: 04/15/2023 22:17    Procedures Procedures  {Document cardiac monitor, telemetry assessment procedure when appropriate:1}  Medications Ordered in ED Medications  sodium chloride 0.9 % bolus 1,000 mL (0 mLs Intravenous Stopped 04/15/23 2243)  ondansetron (ZOFRAN) injection 4 mg (4 mg Intravenous Given 04/15/23 1933)  fentaNYL (SUBLIMAZE) injection 25 mcg (25 mcg Intravenous Given 04/15/23 1933)  iohexol (OMNIPAQUE) 300 MG/ML solution 100 mL (100 mLs Intravenous Contrast Given  04/15/23 1959)    ED Course/ Medical Decision Making/ A&P   {   Click here for ABCD2, HEART and other calculatorsREFRESH Note before signing :1}                              Medical Decision Making Patient here for evaluation of nausea vomiting and cramping abdominal pain that has been intermittent.  History of chronic nausea vomiting secondary to  Amount and/or Complexity of Data Reviewed Labs: ordered.    Details: Labs interpreted by me, no significant no significant leukocytosis, lipase unremarkable.  Chemistries slight increase in BUN and serum creatinine.  No evidence of AKI.  Potassium unremarkable.  Urinalysis without evidence of infection.  Magnesium unremarkable Radiology: ordered.    Details: CT abdomen and pelvis without acute findings Discussion of management or test interpretation with external provider(s): On recheck, patient resting comfortably.  Has been received IV fluids reports feeling better.  Tolerated oral fluid challenge and crackers.  Was seen by GI  earlier today and antiemetic medication was changed.  CT abdomen and pelvis without acute findings.  Patient appears appropriate for discharge home at this time.  Will follow-up with her GI provider.  Risk Prescription drug management.     {Document critical care time when appropriate:1} {Document review of labs and clinical decision tools ie heart score, Chads2Vasc2 etc:1}  {Document your independent review of radiology images, and any outside records:1} {Document your discussion with family members, caretakers, and with consultants:1} {Document social determinants of health affecting pt's care:1} {Document your decision making why or why not admission, treatments were needed:1} Final Clinical Impression(s) / ED Diagnoses Final diagnoses:  Nausea and vomiting, unspecified vomiting type    Rx / DC Orders ED Discharge Orders     None

## 2023-04-15 NOTE — ED Triage Notes (Signed)
Pt states she has been nauseous x 5 days.  Pt states she hasn't been able to eat, for the past 5 days.  States the abdominal cramping is intense.  Pt reports 3 bm in last 24 hours. Reports her normal nausea meds aren't working & her PCP prescribed Reglan but she hasn't been able to pick it up from the pharmacy today.

## 2023-04-15 NOTE — Discharge Instructions (Signed)
Frequent small sips of clear fluids, small frequent meals.  Please follow-up with your GI provider for recheck.

## 2023-04-15 NOTE — Progress Notes (Signed)
GI Office Note    Referring Provider: Kirstie Peri, MD Primary Care Physician:  Kirstie Peri, MD Primary Gastroenterologist: Hennie Duos. Marletta Lor, DO  Date:  04/15/2023  ID:  Alicia Tyler, DOB 16-Feb-1971, MRN [id number]   Chief Complaint   Chief Complaint  Patient presents with   Follow-up    Follow up on abd cramping, nausea, pt needs refills. Pt has been like this for 4 days, diarrhea. Nausea meds are not working   History of Present Illness  Alicia Tyler is a 52 y.o. female with a history of gastroparesis, GERD, anxiety, IBS, allergies, dysphagia, and colon polyps presenting today with complaint of ongoing abdominal cramping and nausea.  History of total colectomy 2016 with follow-up flexible sigmoidoscopy and EGD/ED also in 2016.  Flexible sigmoidoscopy with normal rectum and normal anastomosis, small internal hemorrhoids.  EGD with mild gastritis and small benign gastric polyp in the gastric fundus.  Multiple biopsies taken showing reflux changes.  Dilation performed at the proximal esophagus due to possible esophageal web.  She was advised to continue omeprazole twice daily and complete BPE if she continued to have dysphagia.  Next flex sigmoidoscopy in 5 years.   EGD performed in July 2018 after BPE showing possible obstruction near the GE junction.  EGD with mild benign-appearing intrinsic stenosis found in the esophagus s/p dilation.  If symptoms not improved in 1 month patient was to be referred for esophageal manometry.   Office visit 10/11/2020 for follow-up of GERD, gastroparesis, and constipation.  Reported GERD symptoms were going okay, still has belching and occasional burning in the esophagus.  She was on omeprazole 20 mg twice daily and felt like that was doing well for her.  She declined changing medication.  Zofran is controlling her nausea well interested in changing medication.  Reported a good appetite.  Stated occasional straining with constipation but would have  normal stools.  Reported she thought it was time for another flexible sigmoidoscopy.  Denied any BRBPR, melena, fever, chills, intentional weight loss.  She was scheduled for flexible sigmoidoscopy, advised to continue omeprazole, Linzess 290 mcg daily, Zofran twice daily as needed.   Flexible sigmoidoscopy performed 11/24/2020 with a single 2 mm polyp in the sigmoid colon, patent end-to-end ileocolonic anastomosis.  Pathology revealing diminutive hyperplastic polyp. Recommended follow-up in the clinic in 1 year, repeat flex sig in 5 years.  Last office visit 01/17/2022.  Reportedly having solid and pill dysphagia for few months.  Food feels like he gets stuck at times and at the base of her neck.  Sometimes feels like vomit gets stuck as well.  Painful chest hiccups at times, couple times per week.  Continuing to experience nausea.  Recently having increased gas which makes her nausea worse.  Taking Gas-X to help with constipation.  Denied high gas producing foods.  Having daily bowel movement of the Linzess, stools firmer in the mornings prior to Linzess and looser after.  Admits to having cereal before bed.  Not recently as active given recent surgeries.  Advise BPE, possible EGD if abnormal.  Continue Linzess, avoid gas producing foods.  Continue simethicone.  Recommended low FODMAP diet.  Stop omeprazole start pantoprazole.  Advised continue Zofran and exercise as tolerated.  BPE 01/24/2022: -Age-related dysmotility -Tablet passed easily -No witnessed reflux -Abnormal border of posterior tongue, irregular with barium extending, suspicious for polypoid mass, direct visitation recommended to exclude neoplasm. -ENT referral placed, patient advised to follow-up with ENT  Today: GERD -  Since Thursday she  has had frequent belching, unable to pass flatulence below. Yesterday had trapped gas under her right arm. Has taken gas ex medication as well.   Nausea - Normally nauseas in the morning at baseline.  Since Thursday she was unable to eat and has been like this since. Had 2 vomiting episodes and emesis is bile colored. Stools were yellow colored as well. Has not really had much solid food other than little bits of cornbread. Reports she can tell she is dehydrated and she can feel it, started liquid IV. Drinking diet mt dew. Started having muscle cramps and then started having severe abdominal cramping (states they feel like birthing pains and she states she can visually see abdominal cramping and stomach bloats - can last about 30 minutes). Unable to get it to go away with walking or massaging - feels like baby kicking her and then eases on its own. Wants to double her over. This occurs a couple times a day for the last several days. Getting nauseas with hot flashes currently. Stomach rumbling right now, even if she does not vomit she is dry heaving. Has not eaten anything. Denies any recent sick contacts., has been at home. No visitors. No worsening diarrhea above baseline.   Abdominal spasm triggered by coughing and positioning.   No increased frequency.   Current regimen: Zofran compazine. Takes compazine the most but has taken all of it for this month but the Zofran is not working. Still taking Linzess despite her symptoms.   Has not seen ENT, unsure if they contacted her.   Current Outpatient Medications  Medication Sig Dispense Refill   Ascorbic Acid (VITAMIN C) 1000 MG tablet Take 1,000 mg by mouth in the morning and at bedtime.     Cholecalciferol (VITAMIN D3) 50 MCG (2000 UT) TABS Take 2,000 Units by mouth daily.     cyanocobalamin (,VITAMIN B-12,) 1000 MCG/ML injection Inject 1,000 mcg into the muscle every 30 (thirty) days.     escitalopram (LEXAPRO) 20 MG tablet Take 20 mg by mouth daily.     furosemide (LASIX) 20 MG tablet Take 20 mg by mouth 2 (two) times a week. Mondays and Fridays     LINZESS 290 MCG CAPS capsule TAKE ONE CAPSULE BY MOUTH EVERY DAY 30 minutes BEFORE BREAKFAST 90  capsule 2   loratadine (CLARITIN) 10 MG tablet Take 10 mg by mouth daily.     methocarbamol (ROBAXIN) 750 MG tablet Take 1 tablet by mouth 2 (two) times daily.     naloxone (NARCAN) nasal spray 4 mg/0.1 mL naloxone 4 mg/actuation nasal spray  one SPRAY into nose ONCE AS NEEDED FOR UP TO one DOSE     ondansetron (ZOFRAN-ODT) 4 MG disintegrating tablet Take 4 mg by mouth every 8 (eight) hours as needed for nausea or vomiting.     pantoprazole (PROTONIX) 40 MG tablet TAKE 1 TABLET BY MOUTH TWICE DAILY 60 tablet 0   phentermine (ADIPEX-P) 37.5 MG tablet Take 37.5 mg by mouth daily before breakfast.     prochlorperazine (COMPAZINE) 10 MG tablet Take 1 tablet by mouth 3 (three) times daily as needed.     promethazine (PHENERGAN) 25 MG tablet Take 25 mg by mouth every 6 (six) hours as needed for nausea.     rosuvastatin (CRESTOR) 5 MG tablet Take 5 mg by mouth daily.     SUMAtriptan (IMITREX) 100 MG tablet Take 100 mg by mouth every 2 (two) hours as needed for migraine or headache. May repeat in 2  hours if headache persists or recurs.     Tetrahydroz-Dextran-PEG-Povid (VISINE ADVANCED RELIEF) 0.05-0.1-1-1 % SOLN Place 2 drops into both eyes 2 (two) times daily.     topiramate (TOPAMAX) 50 MG tablet Take 50 mg by mouth 2 (two) times daily.     Zinc Sulfate 220 (50 Zn) MG TABS Take 220 mg by mouth in the morning.     No current facility-administered medications for this visit.    Past Medical History:  Diagnosis Date   Anxiety    BMI (body mass index) 20.0-29.01 Apr 2010 172 LBS   Chronic back pain    Degenerative disc disease, thoracic    Gastroparesis    GERD (gastroesophageal reflux disease)    Headache(784.0)    IBS (irritable bowel syndrome)    Incontinence of feces    WEARS DEPENDS AT HS     Neuropathy    PONV (postoperative nausea and vomiting)    Pseudoobstruction of colon 08/24/2014   Seasonal allergies    Serrated adenoma of colon 05/2010    Past Surgical History:  Procedure  Laterality Date   ABLATION     endometrial ablation   APPENDECTOMY     BACK SURGERY  05/14/2011   Lumbar Fusion and cage   BIOPSY  06/22/2015   Procedure: BIOPSY;  Surgeon: West Bali, MD;  [facility]: [facility];  Service: Endoscopy;;  gastric and esophageal bx's   BREAST LUMPECTOMY     from the left breast 2003,  2016   CHOLECYSTECTOMY     COLECTOMY N/A 08/24/2014   Procedure: TOTAL COLECTOMY;  Surgeon: Dalia Heading, MD;  Location: [facility];  Service: General;  Laterality: N/A;   COLON SURGERY     COLONOSCOPY  NOV 2011 SCREENING    HYPERPLASTIC RECTAL POLYP, SML IH, incomplete due to discomfort   COLONOSCOPY  DEC 2011 w/ PROPOFOL   SERRATED ADENOMA, HYPERPLASTIC POLY[S[   ESOPHAGEAL DILATION     ESOPHAGEAL MANOMETRY N/A 12/29/2017   Procedure: ESOPHAGEAL MANOMETRY (EM);  Surgeon: Napoleon Form, MD;  Location: [facility];  Service: Endoscopy;  Laterality: N/A;   ESOPHAGOGASTRODUODENOSCOPY (EGD) WITH PROPOFOL N/A 06/22/2015   Procedure: ESOPHAGOGASTRODUODENOSCOPY (EGD) WITH PROPOFOL;  Surgeon: West Bali, MD;  [facility];  Service: Endoscopy;  Laterality: N/A;   ESOPHAGOGASTRODUODENOSCOPY (EGD) WITH PROPOFOL N/A 01/21/2017   Procedure: ESOPHAGOGASTRODUODENOSCOPY (EGD) WITH PROPOFOL;  Surgeon: West Bali, MD;  [facility];  Service: Endoscopy;  Laterality: N/A;  9:30am   FLEXIBLE SIGMOIDOSCOPY N/A 06/22/2015   Procedure: FLEXIBLE SIGMOIDOSCOPY WITH PROPOFOL;  Surgeon: West Bali, MD;  [facility];  Service: Endoscopy;  Laterality: N/A;  0830    FLEXIBLE SIGMOIDOSCOPY N/A 11/24/2020   Procedure: FLEXIBLE SIGMOIDOSCOPY;  Surgeon: Lanelle Bal, DO;  [facility];  Service: Endoscopy;  Laterality: N/A;  10:30am   HARDWARE REMOVAL N/A 09/03/2016   Procedure: Removal of Lumbar five-Sacrum one  hardware with Metrex;  Surgeon: Barnett Abu, MD;  Location: [facility] OR;  Service: Neurosurgery;  Laterality: N/A;   KNEE  SURGERY Bilateral    2 on righ-1 scope and one open; and one on left   MOUTH SURGERY  07/21/2013   open colectomy      OVARIAN CYST REMOVAL     POLYPECTOMY  11/24/2020   Procedure: POLYPECTOMY;  Surgeon: Lanelle Bal, DO;  [facility];  Service: Endoscopy;;   SAVORY DILATION N/A 01/21/2017   Procedure: SAVORY DILATION;  Surgeon:  Fields, Darleene Cleaver, MD;  [facility];  Service: Endoscopy;  Laterality: N/A;   SMART PILL PROCEDURE N/A 07/04/2014   Procedure: SMART PILL PROCEDURE;  Surgeon: West Bali, MD;  [facility];  Service: Endoscopy;  Laterality: N/A;  800   TOTAL SHOULDER ARTHROPLASTY Right 12/14/2020   Procedure: TOTAL SHOULDER ARTHROPLASTY;  Surgeon: Francena Hanly, MD;  Location: [facility];  Service: Orthopedics;  Laterality: Right;    TUBAL LIGATION      Family History  Problem Relation Age of Onset   Breast cancer Maternal Aunt    Breast cancer Maternal Aunt    Colon cancer Neg Hx    Colon polyps Neg Hx     Allergies as of 04/15/2023 - Review Complete 04/15/2023  Allergen Reaction Noted   Aspirin Shortness Of Breath, Swelling, and Rash    Bee venom Anaphylaxis 07/08/2013   Eletriptan Anaphylaxis and Swelling 04/14/2020   Codeine Nausea And Vomiting and Rash    Penicillins Other (See Comments)     Social History   Socioeconomic History   Marital status: Married    Spouse name: Not on file   Number of children: Not on file   Years of education: Not on file   Highest education level: Not on file  Occupational History   Not on file  Tobacco Use   Smoking status: Former    Current packs/day: 0.00    Average packs/day: 1 pack/day for 37.0 years (37.0 ttl pk-yrs)    Types: Cigarettes    Start date: 07/24/1985    Quit date: 07/24/2022    Years since quitting: 0.7   Smokeless tobacco: Never   Tobacco comments:    vapes CBD oil  Vaping Use   Vaping status: Some Days  Substance and Sexual Activity   Alcohol use: Yes     Comment: once a year when they go to the beach   Drug use: No   Sexual activity: Yes    Birth control/protection: Surgical, Post-menopausal    Comment: tubal  Other Topics Concern   Not on file  Social History Narrative   DAUGHTER IS SEEING IMPAIRED. LEARNING TO PLAY VIOLIN.   Social Determinants of Health   Financial Resource Strain: Low Risk  (07/30/2021)   Received from [facility], [facility]   Overall Financial Resource Strain (CARDIA)    Difficulty of Paying Living Expenses: Not hard at all  Food Insecurity: No Food Insecurity (07/30/2021)   Received from [facility], [facility]   Hunger Vital Sign    Worried About Running Out of Food in the Last Year: Never true    Ran Out of Food in the Last Year: Never true  Transportation Needs: No Transportation Needs (07/30/2021)   Received from [facility], [facility]   PRAPARE - Transportation    Lack of Transportation (Medical): No    Lack of Transportation (Non-Medical): No  Physical Activity: Inactive (07/30/2021)   Received from [facility], [facility]   Exercise Vital Sign    Days of Exercise per Week: 0 days    Minutes of Exercise per Session: 0 min  Stress: No Stress Concern Present (07/30/2021)   Received from [facility], [facility] - Occupational Stress Questionnaire    Feeling of Stress : Only a little  Social Connections: Unknown (10/24/2021)   Received from [facility], [facility]   Social Network    Social Network: Not on file  Recent Concern: Social Connections - Moderately Isolated (07/30/2021)   Received from [facility], [facility]   Social Connection and Isolation Panel [NHANES]    Frequency of Communication with Friends and Family: Once a week    Frequency of Social Gatherings with Friends and Family: Once a week    Attends Religious Services: 1 to 4 times per year    Active Member of [employer] or Organizations: No    Attends Tax inspector Meetings: Never    Marital Status: Married     Review of Systems   Gen: Denies fever, chills, anorexia. Denies fatigue, weakness, weight loss.  CV: Denies chest pain, palpitations, syncope, peripheral edema, and claudication. Resp: Denies dyspnea at rest, cough, wheezing, coughing up blood, and pleurisy. GI: See HPI Derm: Denies rash, itching, dry skin Psych: Denies depression, anxiety, memory loss, confusion. No homicidal or suicidal ideation.  Heme: Denies bruising, bleeding, and enlarged lymph nodes.   Physical Exam   BP (!) 135/95   Pulse (!) 112   Temp 97.9 F (36.6 C)   Ht 5\' 2"  (1.575 m)   Wt 163 lb (73.9 kg)   LMP 07/22/2011 Comment: tubal ligation/ablation  BMI 29.81 kg/m   General:   Alert and oriented. No distress noted. Pleasant and cooperative.  Head:  Normocephalic and atraumatic. Eyes:  Conjuctiva clear without scleral icterus. Mouth:  Oral mucosa pink and moist. Good dentition. No lesions. Abdomen:  +BS, soft, non-distended. TTP to upper abdomen < epigastric/LUQ. No rebound or guarding. No HSM or masses noted. Rectal: deferred Msk:  Symmetrical without gross deformities. Normal posture. Extremities:  Without edema. Neurologic:  Alert and  oriented x4 Psych:  Alert and cooperative. Normal mood and affect.   Assessment  Alicia Tyler is a 52 y.o. female with a history of gastroparesis, GERD, anxiety, IBS, allergies, dysphagia, and colon polyps presenting today with complaint of ongoing abdominal cramping and nausea.  GERD: Continuing to experience frequent belching and not passing much flatulence.  Maintained on PPI twice daily.  Experiencing nausea and abdominal cramping as noted below.  Reinforced GERD diet.  Slowly advance to bland diet as tolerated.  Dysphagia: Not currently complaining of any significant dysphagia.  Given abnormal border of her posterior tongue on BPE performed last year, continue to recommend ENT evaluation if she has not  seen them.  Constipation: Has baseline constipation.  Maintained on Linzess 290 mcg daily.  Continues to experience bloating and gassiness with inability to pass flatulence, only belching.  Has continued despite nausea and vomiting and decreased p.o. intake.  Gastroparesis, intractable nausea, abdominal cramping: Has baseline daily nausea which is usually controlled with Compazine and/or Zofran which usually occurs in the mornings.  Since Thursday she has been constantly nauseous with a lack of appetite.  States Compazine has been the only thing working, Zofran she has taken but does not get much relief.  Only had 2 episodes of vomiting that was yellow in color.  Has been able to take in small bites of cornbread but otherwise mostly just liquids the best she can.  Did try some liquid IV.  Having muscle cramps which is likely secondary to potential electrolyte abnormalities/dehydration.  She has having significant abdominal cramping with bloating that can double her over at times.  Given she has been to be receiving Reglan, would not recommend dicyclomine.  Hopeful that with control of nausea and vomiting her abdominal cramping will improve.  Will assess with labs including electrolytes.  Encouraged hydration and will  correct electrolytes if needed.  Will plan to update abdominal imaging if no improvement.  PLAN   CBC, CMP, lipase, magnesium Reglan 5 mg TID PRN, stop compazine for now Continue pantoprazole 40 mg twice daily Monitor for side effect of Reglan Discussed ED precautions Zofran as needed Abdominal imaging if no improvement. ENT referral GERD diet Bland diet Follow up in 4 weeks.      Brooke Bonito, MSN, FNP-BC, AGACNP-BC [facility]

## 2023-04-15 NOTE — ED Notes (Signed)
Patient transported to CT 

## 2023-04-15 NOTE — Patient Instructions (Addendum)
I am sending in Reglan for you to take 5 mg up to 3 times a day as needed for nausea.  This will be in place of your Compazine.  You may continue to take Zofran as needed between doses of Reglan.  Monitor for chest pain and irregular heart rhythm.  Continue pantoprazole 40 mg twice daily.  Monitor for potential side effects of Reglan - drowsiness, fatigue, restlessness,tremor, rigid muscles, involuntary movements of the face, tongue, trunk, and/or extremities, dizziness, and confusion.   Continue bland diet, ensuring you are staying hydrated to the best of your ability.  I am ordering labs for you 2 have completed at [facility].  This is [address] across the street from the [facility] emergency department.  It is located on the second floor.  We will resend the ENT referral for you.  Follow up in 4 weeks.  If your symptoms become more severe or you are feeling lightheaded or dizzy please go to the ED.  It was a pleasure to see you today. I want to create trusting relationships with patients. If you receive a survey regarding your visit,  I greatly appreciate you taking time to fill this out on paper or through your MyChart. I value your feedback.  Brooke Bonito, MSN, FNP-BC, AGACNP-BC [facility]

## 2023-04-16 ENCOUNTER — Other Ambulatory Visit: Payer: Self-pay | Admitting: *Deleted

## 2023-04-16 DIAGNOSIS — R9389 Abnormal findings on diagnostic imaging of other specified body structures: Secondary | ICD-10-CM

## 2023-04-16 LAB — COMPREHENSIVE METABOLIC PANEL
AG Ratio: 1.5 (calc) (ref 1.0–2.5)
ALT: 53 U/L — ABNORMAL HIGH (ref 6–29)
AST: 20 U/L (ref 10–35)
Albumin: 5.3 g/dL — ABNORMAL HIGH (ref 3.6–5.1)
Alkaline phosphatase (APISO): 101 U/L (ref 37–153)
BUN/Creatinine Ratio: 17 (calc) (ref 6–22)
BUN: 21 mg/dL (ref 7–25)
CO2: 21 mmol/L (ref 20–32)
Calcium: 11.1 mg/dL — ABNORMAL HIGH (ref 8.6–10.4)
Chloride: 102 mmol/L (ref 98–110)
Creat: 1.27 mg/dL — ABNORMAL HIGH (ref 0.50–1.03)
Globulin: 3.6 g/dL (ref 1.9–3.7)
Glucose, Bld: 105 mg/dL — ABNORMAL HIGH (ref 65–99)
Potassium: 5.4 mmol/L — ABNORMAL HIGH (ref 3.5–5.3)
Sodium: 137 mmol/L (ref 135–146)
Total Bilirubin: 0.4 mg/dL (ref 0.2–1.2)
Total Protein: 8.9 g/dL — ABNORMAL HIGH (ref 6.1–8.1)

## 2023-04-16 LAB — CBC
HCT: 53.5 % — ABNORMAL HIGH (ref 35.0–45.0)
Hemoglobin: 18 g/dL — ABNORMAL HIGH (ref 11.7–15.5)
MCH: 29.3 pg (ref 27.0–33.0)
MCHC: 33.6 g/dL (ref 32.0–36.0)
MCV: 87 fL (ref 80.0–100.0)
MPV: 11 fL (ref 7.5–12.5)
Platelets: 329 10*3/uL (ref 140–400)
RBC: 6.15 10*6/uL — ABNORMAL HIGH (ref 3.80–5.10)
RDW: 13.2 % (ref 11.0–15.0)
WBC: 10.9 10*3/uL — ABNORMAL HIGH (ref 3.8–10.8)

## 2023-04-16 LAB — LIPASE: Lipase: 19 U/L (ref 7–60)

## 2023-04-16 LAB — MAGNESIUM: Magnesium: 2.1 mg/dL (ref 1.5–2.5)

## 2023-04-17 ENCOUNTER — Encounter (INDEPENDENT_AMBULATORY_CARE_PROVIDER_SITE_OTHER): Payer: Self-pay | Admitting: Otolaryngology

## 2023-04-21 ENCOUNTER — Other Ambulatory Visit: Payer: Self-pay | Admitting: *Deleted

## 2023-04-21 DIAGNOSIS — R9389 Abnormal findings on diagnostic imaging of other specified body structures: Secondary | ICD-10-CM

## 2023-04-21 DIAGNOSIS — R110 Nausea: Secondary | ICD-10-CM

## 2023-05-12 NOTE — Progress Notes (Unsigned)
GI Office Note    Referring Provider: Kirstie Peri, MD Primary Care Physician:  Kirstie Peri, MD Primary Gastroenterologist: Hennie Duos. Marletta Lor, DO  Date:  05/13/2023  ID:  Alicia Tyler, DOB 1970-08-31, MRN [id number]   Chief Complaint   Chief Complaint  Patient presents with   Follow-up    Follow up. No problems    History of Present Illness  Alicia Tyler is a 52 y.o. female with a history of GERD, anxiety, IBS, gastroparesis, allergies, dysphagia, and colon polyps presenting today for follow-up.  Last office visit 04/15/2023.  Ongoing morning nausea at baseline.  Had been unable to eat for the last couple of days and had 2 vomiting episodes that were bile in color.  Also had reported some yellow stools and had not really had had much solid food other than some cornbread.  Also having nausea with hot flashes.  No worsening diarrhea above her baseline.  Abdominal spasms triggered by coughing and positioning.  Using Zofran and Compazine but mostly using Compazine as Zofran was not working well.  Still taking Linzess despite her diarrhea.  Had not seen ENT yet.  Continue to have frequent belching and unable to pass flatulence recently. Advised to check CBC, CMP, lipase, magnesium.  Advised Reglan 5 mg 3 times daily as needed and stop Compazine.  Continue PPI twice daily, discussed ED precautions.  Continue Zofran as needed with consideration to update abdominal imaging if no improvement.  Given ENT referral and advised GERD/bland diet.  CBC    Component Value Date/Time   WBC 10.8 (H) 04/15/2023 1341   RBC 5.99 (H) 04/15/2023 1341   HGB 17.5 (H) 04/15/2023 1341   HCT 52.9 (H) 04/15/2023 1341   PLT 281 04/15/2023 1341   MCV 88.3 04/15/2023 1341   MCH 29.2 04/15/2023 1341   MCHC 33.1 04/15/2023 1341   RDW 13.2 04/15/2023 1341   LYMPHSABS 1.1 09/28/2020 1044   MONOABS 1.1 (H) 09/28/2020 1044   EOSABS 0.0 09/28/2020 1044   BASOSABS 0.0 09/28/2020 1044      Latest Ref Rng & Units  04/15/2023    1:41 PM 04/15/2023   11:57 AM 12/05/2020    1:23 PM  CMP  Glucose 70 - 99 mg/dL 413  244  97   BUN 6 - 20 mg/dL 22  21  22    Creatinine 0.44 - 1.00 mg/dL 0.10  2.72  5.36   Sodium 135 - 145 mmol/L 135  137  138   Potassium 3.5 - 5.1 mmol/L 4.8  5.4  4.4   Chloride 98 - 111 mmol/L 104  102  106   CO2 22 - 32 mmol/L 19  21  23    Calcium 8.9 - 10.3 mg/dL 64.4  03.4  9.8   Total Protein 6.5 - 8.1 g/dL 9.0  8.9    Total Bilirubin 0.3 - 1.2 mg/dL 0.7  0.4    Alkaline Phos 38 - 126 U/L 94     AST 15 - 41 U/L 23  20    ALT 0 - 44 U/L 61  53      Went to the ED 04/15/2023 for her vomiting.  CT A/P as noted below.  CT A/P 04/15/2023: -Postsurgical changes noted in the right abdomen consistent with prior colectomy -Patent small bowel to colon anastomosis -Stomach within normal limits -No liver or biliary etiology, s/p cholecystectomy  Today: Nausea better than previously. She got IV fluids with the ED visit. Has bene using the reglan since  last visit, taking it sometimes 3 times but mostly twice daily. No side effects. Has compazine and zofran at home but has not need tat much at all. No headaches or abdominal pain.   Still having some rumbling in the bowel. Not been tacking probiotic as much as she used to but does have it. Does notice improvement if able.   Has not had any change in bowel habits - going about 3 times daily. More loose and not liquidly. No melena or brbpr. Stool color is back to brown and normal in color.   GERD is about the same. Dysphagia is about the same.   Goes tomorrow to see ENT at 10:30  Wt Readings from Last 3 Encounters:  05/13/23 169 lb (76.7 kg)  04/15/23 163 lb (73.9 kg)  04/15/23 163 lb (73.9 kg)    Current Outpatient Medications  Medication Sig Dispense Refill   Ascorbic Acid (VITAMIN C) 1000 MG tablet Take 1,000 mg by mouth in the morning and at bedtime.     Cholecalciferol (VITAMIN D3) 50 MCG (2000 UT) TABS Take 2,000 Units by mouth  daily.     cyanocobalamin (,VITAMIN B-12,) 1000 MCG/ML injection Inject 1,000 mcg into the muscle every 30 (thirty) days.     escitalopram (LEXAPRO) 20 MG tablet Take 20 mg by mouth daily.     furosemide (LASIX) 20 MG tablet Take 20 mg by mouth 2 (two) times a week. Mondays and Fridays     LINZESS 290 MCG CAPS capsule TAKE ONE CAPSULE BY MOUTH EVERY DAY 30 minutes BEFORE BREAKFAST 90 capsule 2   loratadine (CLARITIN) 10 MG tablet Take 10 mg by mouth daily.     methocarbamol (ROBAXIN) 750 MG tablet Take 1 tablet by mouth 2 (two) times daily.     metoCLOPramide (REGLAN) 5 MG tablet Take 1 tablet (5 mg total) by mouth every 8 (eight) hours as needed for nausea. 30 tablet 1   naloxone (NARCAN) nasal spray 4 mg/0.1 mL naloxone 4 mg/actuation nasal spray  one SPRAY into nose ONCE AS NEEDED FOR UP TO one DOSE     ondansetron (ZOFRAN-ODT) 4 MG disintegrating tablet Take 1 tablet (4 mg total) by mouth every 8 (eight) hours as needed for nausea or vomiting. 30 tablet 1   pantoprazole (PROTONIX) 40 MG tablet Take 1 tablet (40 mg total) by mouth 2 (two) times daily. 60 tablet 5   phentermine (ADIPEX-P) 37.5 MG tablet Take 37.5 mg by mouth daily before breakfast.     prochlorperazine (COMPAZINE) 10 MG tablet Take 1 tablet by mouth 3 (three) times daily as needed.     rosuvastatin (CRESTOR) 5 MG tablet Take 5 mg by mouth daily.     SUMAtriptan (IMITREX) 100 MG tablet Take 100 mg by mouth every 2 (two) hours as needed for migraine or headache. May repeat in 2 hours if headache persists or recurs.     Tetrahydroz-Dextran-PEG-Povid (VISINE ADVANCED RELIEF) 0.05-0.1-1-1 % SOLN Place 2 drops into both eyes 2 (two) times daily.     topiramate (TOPAMAX) 50 MG tablet Take 50 mg by mouth 2 (two) times daily.     Zinc Sulfate 220 (50 Zn) MG TABS Take 220 mg by mouth in the morning.     promethazine (PHENERGAN) 25 MG tablet Take 25 mg by mouth every 6 (six) hours as needed for nausea. (Patient not taking: Reported on  05/13/2023)     No current facility-administered medications for this visit.    Past Medical History:  Diagnosis Date   Anxiety    BMI (body mass index) 20.0-29.01 Apr 2010 172 LBS   Chronic back pain    Degenerative disc disease, thoracic    Gastroparesis    GERD (gastroesophageal reflux disease)    Headache(784.0)    IBS (irritable bowel syndrome)    Incontinence of feces    WEARS DEPENDS AT HS     Neuropathy    PONV (postoperative nausea and vomiting)    Pseudoobstruction of colon 08/24/2014   Seasonal allergies    Serrated adenoma of colon 05/2010    Past Surgical History:  Procedure Laterality Date   ABLATION     endometrial ablation   APPENDECTOMY     BACK SURGERY  05/14/2011   Lumbar Fusion and cage   BIOPSY  06/22/2015   Procedure: BIOPSY;  Surgeon: West Bali, MD;  [facility]: [facility];  Service: Endoscopy;;  gastric and esophageal bx's   BREAST LUMPECTOMY     from the left breast 2003,  2016   CHOLECYSTECTOMY     COLECTOMY N/A 08/24/2014   Procedure: TOTAL COLECTOMY;  Surgeon: Dalia Heading, MD;  Location: [facility];  Service: General;  Laterality: N/A;   COLON SURGERY     COLONOSCOPY  NOV 2011 SCREENING    HYPERPLASTIC RECTAL POLYP, SML IH, incomplete due to discomfort   COLONOSCOPY  DEC 2011 w/ PROPOFOL   SERRATED ADENOMA, HYPERPLASTIC POLY[S[   ESOPHAGEAL DILATION     ESOPHAGEAL MANOMETRY N/A 12/29/2017   Procedure: ESOPHAGEAL MANOMETRY (EM);  Surgeon: Napoleon Form, MD;  Location: [facility];  Service: Endoscopy;  Laterality: N/A;   ESOPHAGOGASTRODUODENOSCOPY (EGD) WITH PROPOFOL N/A 06/22/2015   Procedure: ESOPHAGOGASTRODUODENOSCOPY (EGD) WITH PROPOFOL;  Surgeon: West Bali, MD;  [facility];  Service: Endoscopy;  Laterality: N/A;   ESOPHAGOGASTRODUODENOSCOPY (EGD) WITH PROPOFOL N/A 01/21/2017   Procedure: ESOPHAGOGASTRODUODENOSCOPY (EGD) WITH PROPOFOL;  Surgeon: West Bali, MD;  [facility];  Service:  Endoscopy;  Laterality: N/A;  9:30am   FLEXIBLE SIGMOIDOSCOPY N/A 06/22/2015   Procedure: FLEXIBLE SIGMOIDOSCOPY WITH PROPOFOL;  Surgeon: West Bali, MD;  [facility];  Service: Endoscopy;  Laterality: N/A;  0830    FLEXIBLE SIGMOIDOSCOPY N/A 11/24/2020   Procedure: FLEXIBLE SIGMOIDOSCOPY;  Surgeon: Lanelle Bal, DO;  [facility];  Service: Endoscopy;  Laterality: N/A;  10:30am   HARDWARE REMOVAL N/A 09/03/2016   Procedure: Removal of Lumbar five-Sacrum one  hardware with Metrex;  Surgeon: Barnett Abu, MD;  Location[facility];  Service: Neurosurgery;  Laterality: N/A;   KNEE SURGERY Bilateral    2 on righ-1 scope and one open; and one on left   MOUTH SURGERY  07/21/2013   open colectomy      OVARIAN CYST REMOVAL     POLYPECTOMY  11/24/2020   Procedure: POLYPECTOMY;  Surgeon: Lanelle Bal, DO;  [facility];  Service: Endoscopy;;   SAVORY DILATION N/A 01/21/2017   Procedure: SAVORY DILATION;  Surgeon: West Bali, MD;  [facility];  Service: Endoscopy;  Laterality: N/A;   SMART PILL PROCEDURE N/A 07/04/2014   Procedure: SMART PILL PROCEDURE;  Surgeon: West Bali, MD;  [facility];  Service: Endoscopy;  Laterality: N/A;  800   TOTAL SHOULDER ARTHROPLASTY Right 12/14/2020   Procedure: TOTAL SHOULDER ARTHROPLASTY;  Surgeon: Francena Hanly, MD;  Location: [facility];  Service: Orthopedics;  Laterality: Right;    TUBAL LIGATION  Family History  Problem Relation Age of Onset   Breast cancer Maternal Aunt    Breast cancer Maternal Aunt    Colon cancer Neg Hx    Colon polyps Neg Hx     Allergies as of 05/13/2023 - Review Complete 05/13/2023  Allergen Reaction Noted   Aspirin Shortness Of Breath, Swelling, and Rash    Bee venom Anaphylaxis 07/08/2013   Eletriptan Anaphylaxis and Swelling 04/14/2020   Codeine Nausea And Vomiting and Rash    Penicillins Other (See Comments)     Social History    Socioeconomic History   Marital status: Married    Spouse name: Not on file   Number of children: Not on file   Years of education: Not on file   Highest education level: Not on file  Occupational History   Not on file  Tobacco Use   Smoking status: Former    Current packs/day: 0.00    Average packs/day: 1 pack/day for 37.0 years (37.0 ttl pk-yrs)    Types: Cigarettes    Start date: 07/24/1985    Quit date: 07/24/2022    Years since quitting: 0.8   Smokeless tobacco: Never   Tobacco comments:    vapes CBD oil  Vaping Use   Vaping status: Some Days  Substance and Sexual Activity   Alcohol use: Yes    Comment: once a year when they go to the beach   Drug use: No   Sexual activity: Yes    Birth control/protection: Surgical, Post-menopausal    Comment: tubal  Other Topics Concern   Not on file  Social History Narrative   DAUGHTER IS SEEING IMPAIRED. LEARNING TO PLAY VIOLIN.   Social Determinants of Health   Financial Resource Strain: Low Risk  (07/30/2021)   Received from [facility], [facility]   Overall Financial Resource Strain (CARDIA)    Difficulty of Paying Living Expenses: Not hard at all  Food Insecurity: No Food Insecurity (07/30/2021)   Received from [facility], [facility]   Hunger Vital Sign    Worried About Running Out of Food in the Last Year: Never true    Ran Out of Food in the Last Year: Never true  Transportation Needs: No Transportation Needs (07/30/2021)   Received from [facility], [facility]   PRAPARE - Transportation    Lack of Transportation (Medical): No    Lack of Transportation (Non-Medical): No  Physical Activity: Inactive (07/30/2021)   Received from [facility], [facility]   Exercise Vital Sign    Days of Exercise per Week: 0 days    Minutes of Exercise per Session: 0 min  Stress: No Stress Concern Present (07/30/2021)   Received from [facility], [facility] - Occupational  Stress Questionnaire    Feeling of Stress : Only a little  Social Connections: Unknown (10/24/2021)   Received from [facility], [facility]   Social Network    Social Network: Not on file  Recent Concern: Social Connections - Moderately Isolated (07/30/2021)   Received from [facility], [facility]   Social Connection and Isolation Panel [NHANES]    Frequency of Communication with Friends and Family: Once a week    Frequency of Social Gatherings with Friends and Family: Once a week    Attends Religious Services: 1 to 4 times per year    Active Member of [employer] or Organizations: No    Attends Banker Meetings: Never  Marital Status: Married   Review of Systems   Gen: Denies fever, chills, anorexia. Denies fatigue, weakness, weight loss.  CV: Denies chest pain, palpitations, syncope, peripheral edema, and claudication. Resp: Denies dyspnea at rest, cough, wheezing, coughing up blood, and pleurisy. GI: See HPI Derm: Denies rash, itching, dry skin Psych: Denies depression, anxiety, memory loss, confusion. No homicidal or suicidal ideation.  Heme: Denies bruising, bleeding, and enlarged lymph nodes.  Physical Exam   BP 121/84 (BP Location: Right Arm, Patient Position: Sitting, Cuff Size: Normal)   Pulse 92   Temp 97.9 F (36.6 C) (Temporal)   Ht 5\' 2"  (1.575 m)   Wt 169 lb (76.7 kg)   LMP 07/22/2011 Comment: tubal ligation/ablation  BMI 30.91 kg/m   General:   Alert and oriented. No distress noted. Pleasant and cooperative.  Head:  Normocephalic and atraumatic. Eyes:  Conjuctiva clear without scleral icterus. Mouth:  Oral mucosa pink and moist. Good dentition. No lesions. Abdomen:  +BS, soft, non-tender and non-distended. No rebound or guarding. No HSM or masses noted. Rectal: deferred Msk:  Symmetrical without gross deformities. Normal posture. Extremities:  Without edema. Neurologic:  Alert and  oriented x4 Psych:  Alert and cooperative. Normal mood and  affect.  Assessment  Alicia Tyler is a 52 y.o. female with a history of GERD, anxiety, IBS, gastroparesis, allergies, dysphagia, and colon polyps presenting today for follow-up.  Diarrhea: Takes a probiotic as needed and still having some rumbling in her abdomen but no overt diarrhea above her baseline.  Continues to go about 3 times a day and is usually more loose but not watery in nature and has gone back to a more normal brown color.  GERD: Continues to have belching but overall GERD symptoms fairly well-controlled with pantoprazole twice daily.  GERD diet reinforced.  Dysphagia: Continues to have some intermittent issues with dysphagia.  Is going tomorrow to see ENT given the previously noted abnormality on the border of the posterior tongue on barium pill esophagram.  Gastroparesis, nausea: Has baseline gastroparesis with daily nausea that was previously controlled with Compazine and/or Zofran.  Since starting Reglan at last visit she has had good control of symptoms and not currently having any side effects.  She still has Zofran to keep on hand as needed.  Started feeling better immediately with improvement of abdominal cramping after receiving IV fluids in the ED.  Suspected this was a gastroparesis flare/possible cyclic vomiting episode.  Encouraged ongoing hydration and to continue Reglan for now given she is tolerating it well.  PLAN   GERD diet Continue pantoprazole 40 mg twice daily.  Zofran as needed.  Reglan 5 mg TID as needed. Refilled today.  Follow up 3 months    Brooke Bonito, MSN, FNP-BC, AGACNP-BC [facility]

## 2023-05-13 ENCOUNTER — Encounter: Payer: Self-pay | Admitting: Gastroenterology

## 2023-05-13 ENCOUNTER — Ambulatory Visit: Payer: Medicaid Other | Admitting: Gastroenterology

## 2023-05-13 VITALS — BP 121/84 | HR 92 | Temp 97.9°F | Ht 62.0 in | Wt 169.0 lb

## 2023-05-13 DIAGNOSIS — R140 Abdominal distension (gaseous): Secondary | ICD-10-CM

## 2023-05-13 DIAGNOSIS — R110 Nausea: Secondary | ICD-10-CM | POA: Diagnosis not present

## 2023-05-13 DIAGNOSIS — K3184 Gastroparesis: Secondary | ICD-10-CM | POA: Diagnosis not present

## 2023-05-13 DIAGNOSIS — K219 Gastro-esophageal reflux disease without esophagitis: Secondary | ICD-10-CM | POA: Diagnosis not present

## 2023-05-13 DIAGNOSIS — R197 Diarrhea, unspecified: Secondary | ICD-10-CM

## 2023-05-13 DIAGNOSIS — R1310 Dysphagia, unspecified: Secondary | ICD-10-CM

## 2023-05-13 DIAGNOSIS — R142 Eructation: Secondary | ICD-10-CM

## 2023-05-13 MED ORDER — METOCLOPRAMIDE HCL 5 MG PO TABS: 5.0000 mg | ORAL_TABLET | Freq: Three times a day (TID) | ORAL | 3 refills | Status: DC | PRN

## 2023-05-13 NOTE — Patient Instructions (Signed)
Follow a GERD diet:  Avoid fried, fatty, greasy, spicy, citrus foods. Avoid caffeine and carbonated beverages. Avoid chocolate. Try eating 4-6 small meals a day rather than 3 large meals. Do not eat within 3 hours of laying down. Prop head of bed up on wood or bricks to create a 6 inch incline.  Gastroparesis recommendations:  4-6 small meals daily Low fat diet Low fiber diet (avoid raw fruits and vegetables).  Continue pantoprazole 40 mg twice daily.  Continue Reglan 5 mg up to 3 times a day as needed.  I sent in refill to the pharmacy for you today.  You can continue to take Zofran as needed for breakthrough.   We will see you again for follow-up in 3 months, sooner if needed.  It was a pleasure to see you today. I want to create trusting relationships with patients. If you receive a survey regarding your visit,  I greatly appreciate you taking time to fill this out on paper or through your MyChart. I value your feedback.  Brooke Bonito, MSN, FNP-BC, AGACNP-BC [facility]

## 2023-05-14 ENCOUNTER — Ambulatory Visit (INDEPENDENT_AMBULATORY_CARE_PROVIDER_SITE_OTHER): Payer: Medicaid Other | Admitting: Otolaryngology

## 2023-05-14 ENCOUNTER — Encounter (INDEPENDENT_AMBULATORY_CARE_PROVIDER_SITE_OTHER): Payer: Self-pay

## 2023-05-14 VITALS — Ht 62.0 in | Wt 169.0 lb

## 2023-05-14 DIAGNOSIS — F17200 Nicotine dependence, unspecified, uncomplicated: Secondary | ICD-10-CM

## 2023-05-14 DIAGNOSIS — J351 Hypertrophy of tonsils: Secondary | ICD-10-CM | POA: Diagnosis not present

## 2023-05-14 DIAGNOSIS — F17210 Nicotine dependence, cigarettes, uncomplicated: Secondary | ICD-10-CM

## 2023-05-14 DIAGNOSIS — K148 Other diseases of tongue: Secondary | ICD-10-CM | POA: Diagnosis not present

## 2023-05-14 NOTE — Progress Notes (Addendum)
Dear Dr. Boris Lown, Here is my assessment for our mutual patient, Alicia Tyler. Thank you for allowing me the opportunity to care for your patient. Please do not hesitate to contact me should you have any other questions. Sincerely, Dr. Jovita Kussmaul  Otolaryngology Clinic Note Referring provider: Dr. Boris Lown HPI:  Alicia Tyler is a 52 y.o. female kindly referred by Dr. Boris Lown for evaluation of posterior tongue lesion on Esophagram. Noted incidentally.  Initial visit (04/2023): She is seen after finding on esophagram. She reports she has had trouble swallowing for a long time and sees GI for it. Multiple dilations in the past. Dilations do help. She denies pain with swallowing, no masses noted, no neck masses, no ear pain, SOB, voice changes, hemoptysis. She is otherwise completely asymptomatic.   She does smoke. No significant EtOH history.  She is on linzesta and Pantoprazole  H&N Surgery: no Personal or FHx of bleeding dz or anesthesia difficulty: no  AP/AC: no  Tobacco: smokes - 1 PPD (45 years). Alcohol: no. Occupation: disabled - prior waitress. Lives in [place]  PMHx: Chronic pain (sees pain - pain agreement), Gastroparesis, Anxiety, GERD, IBS, Colonic polyps; H/o Total colectomy in 2016, multiple EGDs w/dilation due to poss esophageal web.    Independent Review of Additional Tests or Records:  CT S-spine 2019: some lingual tonsil hypertrophy; no other masses   Esophagram 01/2022: "Abnormal appearance of the posterior lingual border, irregular, with a barium extending into interstices, polypoid mass not excluded"  EGD prior (2016): mild gastritis, reflux changes; dilation prox esophagus 2/2 possible esophageal web  EGD 2018: narrowing near GE junction, dilated; if not improved   Mano 2019  GI 12/2021: Solid and pill dysphagia with some food sticking. No regurgitation.  CBC 2024: WBC 11, Hgb 18  Esophagram 2018 and 2023: appears to have some irregular contour posterior portion  of tongue base - likely lingual tonsils?    MRI 2018: still with some prominent lingual tonsil tissue  PMH/Meds/All/SocHx/FamHx/ROS:   Past Medical History:  Diagnosis Date   Anxiety    BMI (body mass index) 20.0-29.01 Apr 2010 172 LBS   Chronic back pain    Degenerative disc disease, thoracic    Gastroparesis    GERD (gastroesophageal reflux disease)    Headache(784.0)    IBS (irritable bowel syndrome)    Incontinence of feces    WEARS DEPENDS AT HS     Neuropathy    PONV (postoperative nausea and vomiting)    Pseudoobstruction of colon 08/24/2014   Seasonal allergies    Serrated adenoma of colon 05/2010     Past Surgical History:  Procedure Laterality Date   ABLATION     endometrial ablation   APPENDECTOMY     BACK SURGERY  05/14/2011   Lumbar Fusion and cage   BIOPSY  06/22/2015   Procedure: BIOPSY;  Surgeon: West Bali, MD;  [facility]: [facility];  Service: Endoscopy;;  gastric and esophageal bx's   BREAST LUMPECTOMY     from the left breast 2003,  2016   CHOLECYSTECTOMY     COLECTOMY N/A 08/24/2014   Procedure: TOTAL COLECTOMY;  Surgeon: Dalia Heading, MD;  Location: [facility];  Service: General;  Laterality: N/A;   COLON SURGERY     COLONOSCOPY  NOV 2011 SCREENING    HYPERPLASTIC RECTAL POLYP, SML IH, incomplete due to discomfort   COLONOSCOPY  DEC 2011 w/ PROPOFOL   SERRATED ADENOMA, HYPERPLASTIC POLY[S[   ESOPHAGEAL DILATION     ESOPHAGEAL  MANOMETRY N/A 12/29/2017   Procedure: ESOPHAGEAL MANOMETRY (EM);  Surgeon: Napoleon Form, MD;  Location: [facility];  Service: Endoscopy;  Laterality: N/A;   ESOPHAGOGASTRODUODENOSCOPY (EGD) WITH PROPOFOL N/A 06/22/2015   Procedure: ESOPHAGOGASTRODUODENOSCOPY (EGD) WITH PROPOFOL;  Surgeon: West Bali, MD;  [facility];  Service: Endoscopy;  Laterality: N/A;   ESOPHAGOGASTRODUODENOSCOPY (EGD) WITH PROPOFOL N/A 01/21/2017   Procedure: ESOPHAGOGASTRODUODENOSCOPY (EGD) WITH PROPOFOL;  Surgeon: West Bali, MD;  [facility]: [facility];  Service: Endoscopy;  Laterality: N/A;  9:30am   FLEXIBLE SIGMOIDOSCOPY N/A 06/22/2015   Procedure: FLEXIBLE SIGMOIDOSCOPY WITH PROPOFOL;  Surgeon: West Bali, MD;  [facility];  Service: Endoscopy;  Laterality: N/A;  0830    FLEXIBLE SIGMOIDOSCOPY N/A 11/24/2020   Procedure: FLEXIBLE SIGMOIDOSCOPY;  Surgeon: Lanelle Bal, DO;  [facility];  Service: Endoscopy;  Laterality: N/A;  10:30am   HARDWARE REMOVAL N/A 09/03/2016   Procedure: Removal of Lumbar five-Sacrum one  hardware with Metrex;  Surgeon: Barnett Abu, MD;  Location: [facility];  Service: Neurosurgery;  Laterality: N/A;   KNEE SURGERY Bilateral    2 on righ-1 scope and one open; and one on left   MOUTH SURGERY  07/21/2013   open colectomy      OVARIAN CYST REMOVAL     POLYPECTOMY  11/24/2020   Procedure: POLYPECTOMY;  Surgeon: Lanelle Bal, DO;  [facility];  Service: Endoscopy;;   SAVORY DILATION N/A 01/21/2017   Procedure: SAVORY DILATION;  Surgeon: West Bali, MD;  [facility];  Service: Endoscopy;  Laterality: N/A;   SMART PILL PROCEDURE N/A 07/04/2014   Procedure: SMART PILL PROCEDURE;  Surgeon: West Bali, MD;  [facility];  Service: Endoscopy;  Laterality: N/A;  800   TOTAL SHOULDER ARTHROPLASTY Right 12/14/2020   Procedure: TOTAL SHOULDER ARTHROPLASTY;  Surgeon: Francena Hanly, MD;  Location: [facility];  Service: Orthopedics;  Laterality: Right;    TUBAL LIGATION      Family History  Problem Relation Age of Onset   Breast cancer Maternal Aunt    Breast cancer Maternal Aunt    Colon cancer Neg Hx    Colon polyps Neg Hx      Social Connections: Unknown (10/24/2021)   Received from [facility], [facility]   Social Network    Social Network: Not on file  Recent Concern: Social Connections - Moderately Isolated (07/30/2021)   Received from [facility], [facility]   Social Connection and  Isolation Panel [NHANES]    Frequency of Communication with Friends and Family: Once a week    Frequency of Social Gatherings with Friends and Family: Once a week    Attends Religious Services: 1 to 4 times per year    Active Member of [employer] or Organizations: No    Attends Banker Meetings: Never    Marital Status: Married      Current Outpatient Medications:    Ascorbic Acid (VITAMIN C) 1000 MG tablet, Take 1,000 mg by mouth in the morning and at bedtime., Disp: , Rfl:    Cholecalciferol (VITAMIN D3) 50 MCG (2000 UT) TABS, Take 2,000 Units by mouth daily., Disp: , Rfl:    cyanocobalamin (,VITAMIN B-12,) 1000 MCG/ML injection, Inject 1,000 mcg into the muscle every 30 (thirty) days., Disp: , Rfl:    escitalopram (LEXAPRO) 20 MG tablet, Take 20 mg by mouth daily., Disp: , Rfl:    furosemide (LASIX) 20  MG tablet, Take 20 mg by mouth 2 (two) times a week. Mondays and Fridays, Disp: , Rfl:    LINZESS 290 MCG CAPS capsule, TAKE ONE CAPSULE BY MOUTH EVERY DAY 30 minutes BEFORE BREAKFAST, Disp: 90 capsule, Rfl: 2   loratadine (CLARITIN) 10 MG tablet, Take 10 mg by mouth daily., Disp: , Rfl:    methocarbamol (ROBAXIN) 750 MG tablet, Take 1 tablet by mouth 2 (two) times daily., Disp: , Rfl:    metoCLOPramide (REGLAN) 5 MG tablet, Take 1 tablet (5 mg total) by mouth every 8 (eight) hours as needed for nausea., Disp: 30 tablet, Rfl: 3   naloxone (NARCAN) nasal spray 4 mg/0.1 mL, naloxone 4 mg/actuation nasal spray  one SPRAY into nose ONCE AS NEEDED FOR UP TO one DOSE, Disp: , Rfl:    ondansetron (ZOFRAN-ODT) 4 MG disintegrating tablet, Take 1 tablet (4 mg total) by mouth every 8 (eight) hours as needed for nausea or vomiting., Disp: 30 tablet, Rfl: 1   pantoprazole (PROTONIX) 40 MG tablet, Take 1 tablet (40 mg total) by mouth 2 (two) times daily., Disp: 60 tablet, Rfl: 5   phentermine (ADIPEX-P) 37.5 MG tablet, Take 37.5 mg by mouth daily before breakfast., Disp: , Rfl:    prochlorperazine  (COMPAZINE) 10 MG tablet, Take 1 tablet by mouth 3 (three) times daily as needed., Disp: , Rfl:    promethazine (PHENERGAN) 25 MG tablet, Take 25 mg by mouth every 6 (six) hours as needed for nausea., Disp: , Rfl:    rosuvastatin (CRESTOR) 5 MG tablet, Take 5 mg by mouth daily., Disp: , Rfl:    SUMAtriptan (IMITREX) 100 MG tablet, Take 100 mg by mouth every 2 (two) hours as needed for migraine or headache. May repeat in 2 hours if headache persists or recurs., Disp: , Rfl:    Tetrahydroz-Dextran-PEG-Povid (VISINE ADVANCED RELIEF) 0.05-0.1-1-1 % SOLN, Place 2 drops into both eyes 2 (two) times daily., Disp: , Rfl:    topiramate (TOPAMAX) 50 MG tablet, Take 50 mg by mouth 2 (two) times daily., Disp: , Rfl:    Zinc Sulfate 220 (50 Zn) MG TABS, Take 220 mg by mouth in the morning., Disp: , Rfl:    Physical Exam:   Ht 5\' 2"  (1.575 m)   Wt 169 lb (76.7 kg)   LMP 07/22/2011 Comment: tubal ligation/ablation  BMI 30.91 kg/m   Salient findings:  CN II-XII intact  Bilateral EAC clear and TM intact with well pneumatized middle ear spaces Anterior rhinoscopy: Septum relatively midline; no masses appreciated on anterior rhino No lesions of oral cavity/oropharynx No obviously palpable neck masses/lymphadenopathy/thyromegaly No respiratory distress or stridor; voice is not particularly dysphonic - quality class 1.5; easily tolerates secretions; given concern for mass, TFL was indicated and performed  Seprately Identifiable Procedures:  Procedure Note Pre-procedure diagnosis:  concern for oropharynx mass Post-procedure diagnosis: Same Procedure: Transnasal Fiberoptic Laryngoscopy, CPT 09811 - Mod 25 Indication: above Complications: None apparent EBL: 0 mL Date: 05/18/23   The procedure was undertaken to further evaluate the patient's complaint of concern for oropharynx/tongue base mass on imaging, with mirror exam inadequate for appropriate examination due to gag reflex and poor patient  tolerance  Procedure:  Patient was identified as correct patient. Verbal consent was obtained. The nose was sprayed with oxymetazoline and 4% lidocaine. The The flexible laryngoscope was passed through the nose to view the nasal cavity, pharynx (oropharynx, hypopharynx) and larynx.  The larynx was examined at rest and during multiple phonatory tasks. Documentation was  obtained and reviewed with patient. The scope was removed. The patient tolerated the procedure well.  Findings: The nasal cavity and nasopharynx did not reveal any masses or lesions, mucosa appeared to be without obvious lesions. The tongue base, pharyngeal walls, piriform sinuses, vallecula, epiglottis and postcricoid region are normal in appearance EXCEPT: lingual tonsil hypertrophy - no obvious exudate, appears relatively symmetric. The visualized portion of the subglottis and proximal trachea is widely patent. The vocal folds are mobile bilaterally. There are no lesions on the free edge of the vocal folds nor elsewhere in the larynx worrisome for malignancy.      Electronically signed by: Read Drivers, MD 05/18/2023 10:21 AM   Impression & Plans:  Alicia Tyler is a 52 y.o. female with: 1. Lingual tonsil hypertrophy   2. Tongue mass   3. Smoking    Esophagram noted incidental what appears to just be lingual tonsil hypertrophy, which is confirmed on TFL. This does appear to be benign and she is not having any symptoms from this or infections. She denies B symptoms. Appears to have been present since 2018, so suspect if not changing, this is just incidental finding. Discussed her options: 1. Observation 2. CT Neck to ensure no other lymphadenopathy or lesions 3. Biopsy She opted for option 2, which is reasonable  F/u 3 months for recheck  Also discussed smoking cessation and options for cessation, including counseling. She is not ready to quit, and she declined further treatment. Total time spent with this was 4  minutes.  See below regarding exact medications prescribed this encounter including dosages and route: No orders of the defined types were placed in this encounter.     Thank you for allowing me the opportunity to care for your patient. Please do not hesitate to contact me should you have any other questions.  Sincerely, Jovita Kussmaul, MD Otolarynoglogist (ENT), [facility] Phone: [phone] Fax: [phone]  05/18/2023, 10:21 AM   I have personally spent 49 minutes involved in face-to-face and non-face-to-face activities for this patient on the day of the visit.  Professional time spent includes the following activities, in addition to those noted in the documentation: preparing to see the patient (review of outside documentation and results), performing a medically appropriate examination and/or evaluation, counseling and educating the patient/family/caregiver, ordering medications, performing procedures (TFL), referring and communicating with other healthcare professionals, documenting clinical information in the electronic or other health record, independently interpreting results and communicating results with the patient/family/caregiver .

## 2023-05-14 NOTE — Patient Instructions (Signed)
I have ordered an imaging study for you to complete prior to your next visit. Please call [facility] Scheduling at [phone] to schedule your imaging if you have not received a call within 24 hours. If you are unable to complete your imaging study prior to your next scheduled visit please call our office to let us know.

## 2023-05-19 ENCOUNTER — Other Ambulatory Visit: Payer: Self-pay | Admitting: Gastroenterology

## 2023-05-30 ENCOUNTER — Ambulatory Visit (HOSPITAL_COMMUNITY)
Admission: RE | Admit: 2023-05-30 | Discharge: 2023-05-30 | Disposition: A | Discharge status: Home / Self Care | Payer: Medicaid Other | Source: Ambulatory Visit | Attending: Otolaryngology | Admitting: Otolaryngology

## 2023-05-30 DIAGNOSIS — J351 Hypertrophy of tonsils: Secondary | ICD-10-CM

## 2023-05-30 DIAGNOSIS — K148 Other diseases of tongue: Secondary | ICD-10-CM

## 2023-05-30 MED ORDER — IOHEXOL 300 MG/ML  SOLN
75.0000 mL | Freq: Once | INTRAMUSCULAR | Status: AC | PRN
  Administered 2023-05-30: 75 mL via INTRAVENOUS

## 2023-06-26 ENCOUNTER — Encounter: Payer: Self-pay | Admitting: Gastroenterology

## 2023-07-06 ENCOUNTER — Other Ambulatory Visit: Payer: Self-pay | Admitting: Gastroenterology

## 2023-07-15 ENCOUNTER — Other Ambulatory Visit: Payer: Self-pay | Admitting: Gastroenterology

## 2023-08-13 ENCOUNTER — Telehealth (INDEPENDENT_AMBULATORY_CARE_PROVIDER_SITE_OTHER): Payer: Self-pay | Admitting: Otolaryngology

## 2023-08-13 NOTE — Telephone Encounter (Signed)
Left vm to confirm appt and address for 08/14/2023.

## 2023-08-14 ENCOUNTER — Encounter (INDEPENDENT_AMBULATORY_CARE_PROVIDER_SITE_OTHER): Payer: Self-pay

## 2023-08-14 ENCOUNTER — Ambulatory Visit (INDEPENDENT_AMBULATORY_CARE_PROVIDER_SITE_OTHER): Payer: Medicaid Other

## 2023-09-15 ENCOUNTER — Other Ambulatory Visit: Payer: Self-pay | Admitting: Gastroenterology

## 2023-11-03 ENCOUNTER — Other Ambulatory Visit: Payer: Self-pay | Admitting: Gastroenterology

## 2023-11-03 DIAGNOSIS — K219 Gastro-esophageal reflux disease without esophagitis: Secondary | ICD-10-CM

## 2024-03-31 ENCOUNTER — Other Ambulatory Visit: Payer: Self-pay | Admitting: Gastroenterology

## 2024-04-07 ENCOUNTER — Other Ambulatory Visit: Payer: Self-pay | Admitting: Orthopedic Surgery

## 2024-04-07 DIAGNOSIS — M25532 Pain in left wrist: Secondary | ICD-10-CM

## 2024-04-13 ENCOUNTER — Ambulatory Visit
Admission: RE | Admit: 2024-04-13 | Discharge: 2024-04-13 | Disposition: A | Discharge status: Home / Self Care | Source: Ambulatory Visit | Attending: Orthopedic Surgery | Admitting: Orthopedic Surgery

## 2024-04-13 DIAGNOSIS — M25532 Pain in left wrist: Secondary | ICD-10-CM

## 2024-04-29 ENCOUNTER — Other Ambulatory Visit: Payer: Self-pay | Admitting: Gastroenterology

## 2024-04-29 DIAGNOSIS — K219 Gastro-esophageal reflux disease without esophagitis: Secondary | ICD-10-CM
